# Patient Record
Sex: Female | Born: 1935
Health system: Southern US, Community
[De-identification: ages and names within clinical notes are randomized; demographics above are authoritative.]

## PROBLEM LIST (undated history)

## (undated) DIAGNOSIS — D7589 Other specified diseases of blood and blood-forming organs: Secondary | ICD-10-CM

## (undated) DIAGNOSIS — R609 Edema, unspecified: Secondary | ICD-10-CM

## (undated) DIAGNOSIS — I4729 Other ventricular tachycardia: Secondary | ICD-10-CM

## (undated) DIAGNOSIS — M810 Age-related osteoporosis without current pathological fracture: Secondary | ICD-10-CM

## (undated) DIAGNOSIS — I472 Ventricular tachycardia: Secondary | ICD-10-CM

## (undated) DIAGNOSIS — E079 Disorder of thyroid, unspecified: Secondary | ICD-10-CM

## (undated) DIAGNOSIS — I471 Supraventricular tachycardia: Secondary | ICD-10-CM

## (undated) DIAGNOSIS — I4719 Other supraventricular tachycardia: Secondary | ICD-10-CM

## (undated) DIAGNOSIS — E119 Type 2 diabetes mellitus without complications: Secondary | ICD-10-CM

## (undated) DIAGNOSIS — R768 Other specified abnormal immunological findings in serum: Principal | ICD-10-CM

## (undated) DIAGNOSIS — D539 Nutritional anemia, unspecified: Secondary | ICD-10-CM

## (undated) HISTORY — DX: Other ventricular tachycardia: I47.29

## (undated) HISTORY — DX: Ventricular tachycardia: I47.2

## (undated) HISTORY — DX: Other specified abnormal immunological findings in serum: R76.8

## (undated) HISTORY — DX: Supraventricular tachycardia: I47.1

## (undated) HISTORY — DX: Other specified diseases of blood and blood-forming organs: D75.89

## (undated) HISTORY — DX: Type 2 diabetes mellitus without complications: E11.9

## (undated) HISTORY — DX: Disorder of thyroid, unspecified: E07.9

## (undated) HISTORY — PX: APPENDECTOMY: SHX54

## (undated) HISTORY — DX: Edema, unspecified: R60.9

## (undated) HISTORY — DX: Nutritional anemia, unspecified: D53.9

## (undated) HISTORY — DX: Age-related osteoporosis without current pathological fracture: M81.0

## (undated) HISTORY — DX: Other supraventricular tachycardia: I47.19

---

## 2010-03-03 HISTORY — PX: FRACTURE SURGERY: SHX138

## 2010-11-15 ENCOUNTER — Inpatient Hospital Stay (HOSPITAL_COMMUNITY)
Admission: EM | Admit: 2010-11-15 | Discharge: 2010-11-20 | DRG: 481 | Disposition: A | Discharge status: Skilled Nursing Facility | Payer: Medicare HMO | Attending: Orthopedic Surgery | Admitting: Orthopedic Surgery

## 2010-11-15 ENCOUNTER — Emergency Department (HOSPITAL_COMMUNITY): Payer: Medicare HMO

## 2010-11-15 DIAGNOSIS — M810 Age-related osteoporosis without current pathological fracture: Secondary | ICD-10-CM | POA: Diagnosis present

## 2010-11-15 DIAGNOSIS — Z79899 Other long term (current) drug therapy: Secondary | ICD-10-CM

## 2010-11-15 DIAGNOSIS — D62 Acute posthemorrhagic anemia: Secondary | ICD-10-CM | POA: Diagnosis not present

## 2010-11-15 DIAGNOSIS — N289 Disorder of kidney and ureter, unspecified: Secondary | ICD-10-CM | POA: Diagnosis not present

## 2010-11-15 DIAGNOSIS — E039 Hypothyroidism, unspecified: Secondary | ICD-10-CM | POA: Diagnosis present

## 2010-11-15 DIAGNOSIS — Y92009 Unspecified place in unspecified non-institutional (private) residence as the place of occurrence of the external cause: Secondary | ICD-10-CM

## 2010-11-15 DIAGNOSIS — Z01812 Encounter for preprocedural laboratory examination: Secondary | ICD-10-CM

## 2010-11-15 DIAGNOSIS — Y939 Activity, unspecified: Secondary | ICD-10-CM

## 2010-11-15 DIAGNOSIS — S72309A Unspecified fracture of shaft of unspecified femur, initial encounter for closed fracture: Principal | ICD-10-CM | POA: Diagnosis present

## 2010-11-15 DIAGNOSIS — Y998 Other external cause status: Secondary | ICD-10-CM

## 2010-11-15 DIAGNOSIS — Z794 Long term (current) use of insulin: Secondary | ICD-10-CM

## 2010-11-15 DIAGNOSIS — T148XXA Other injury of unspecified body region, initial encounter: Secondary | ICD-10-CM

## 2010-11-15 DIAGNOSIS — E119 Type 2 diabetes mellitus without complications: Secondary | ICD-10-CM | POA: Diagnosis present

## 2010-11-15 DIAGNOSIS — E871 Hypo-osmolality and hyponatremia: Secondary | ICD-10-CM | POA: Diagnosis not present

## 2010-11-15 DIAGNOSIS — W19XXXA Unspecified fall, initial encounter: Secondary | ICD-10-CM | POA: Diagnosis present

## 2010-11-15 LAB — DIFFERENTIAL
Basophils Absolute: 0 10*3/uL (ref 0.0–0.1)
Basophils Relative: 0 % (ref 0–1)
Eosinophils Absolute: 0 10*3/uL (ref 0.0–0.7)
Eosinophils Relative: 0 % (ref 0–5)
Lymphocytes Relative: 13 % (ref 12–46)
Lymphs Abs: 1.2 10*3/uL (ref 0.7–4.0)
Monocytes Absolute: 0.5 10*3/uL (ref 0.1–1.0)
Monocytes Relative: 5 % (ref 3–12)
Neutro Abs: 7.8 10*3/uL — ABNORMAL HIGH (ref 1.7–7.7)
Neutrophils Relative %: 82 % — ABNORMAL HIGH (ref 43–77)

## 2010-11-15 LAB — CBC
HCT: 37.5 % (ref 36.0–46.0)
Hemoglobin: 12.8 g/dL (ref 12.0–15.0)
MCH: 32.9 pg (ref 26.0–34.0)
MCHC: 34.1 g/dL (ref 30.0–36.0)
MCV: 96.4 fL (ref 78.0–100.0)
Platelets: 221 10*3/uL (ref 150–400)
RBC: 3.89 MIL/uL (ref 3.87–5.11)
RDW: 13.4 % (ref 11.5–15.5)
WBC: 9.5 10*3/uL (ref 4.0–10.5)

## 2010-11-16 LAB — GLUCOSE, CAPILLARY
Glucose-Capillary: 195 mg/dL — ABNORMAL HIGH (ref 70–99)
Glucose-Capillary: 331 mg/dL — ABNORMAL HIGH (ref 70–99)
Glucose-Capillary: 216 mg/dL — ABNORMAL HIGH (ref 70–99)

## 2010-11-16 LAB — URINALYSIS, ROUTINE W REFLEX MICROSCOPIC
Leukocytes, UA: NEGATIVE
Protein, ur: NEGATIVE mg/dL
Urobilinogen, UA: 0.2 mg/dL (ref 0.0–1.0)

## 2010-11-16 LAB — BASIC METABOLIC PANEL
BUN: 35 mg/dL — ABNORMAL HIGH (ref 6–23)
CO2: 24 mEq/L (ref 19–32)
Calcium: 9.1 mg/dL (ref 8.4–10.5)
Chloride: 101 mEq/L (ref 96–112)
Creatinine, Ser: 1.07 mg/dL (ref 0.50–1.10)
GFR calc Af Amer: >60 mL/min (ref >60)
GFR calc non Af Amer: 50 mL/min — ABNORMAL LOW (ref >60)
Glucose, Bld: 258 mg/dL — ABNORMAL HIGH (ref 70–99)
Potassium: 4.5 mEq/L (ref 3.5–5.1)
Sodium: 135 mEq/L (ref 135–145)

## 2010-11-16 LAB — URINE MICROSCOPIC-ADD ON

## 2010-11-17 ENCOUNTER — Inpatient Hospital Stay (HOSPITAL_COMMUNITY): Payer: Medicare HMO

## 2010-11-17 LAB — CBC
HCT: 33 % — ABNORMAL LOW (ref 36.0–46.0)
MCV: 97.6 fL (ref 78.0–100.0)
Platelets: 182 10*3/uL (ref 150–400)
RBC: 3.38 MIL/uL — ABNORMAL LOW (ref 3.87–5.11)
WBC: 5.6 10*3/uL (ref 4.0–10.5)

## 2010-11-17 LAB — APTT: aPTT: 30 seconds (ref 24–37)

## 2010-11-17 LAB — GLUCOSE, CAPILLARY
Glucose-Capillary: 166 mg/dL — ABNORMAL HIGH (ref 70–99)
Glucose-Capillary: 290 mg/dL — ABNORMAL HIGH (ref 70–99)

## 2010-11-17 LAB — HEMOGLOBIN AND HEMATOCRIT, BLOOD: HCT: 23.8 % — ABNORMAL LOW (ref 36.0–46.0)

## 2010-11-17 LAB — PROTIME-INR: INR: 1.09 (ref 0.00–1.49)

## 2010-11-18 LAB — BASIC METABOLIC PANEL
CO2: 18 mEq/L — ABNORMAL LOW (ref 19–32)
Calcium: 7.9 mg/dL — ABNORMAL LOW (ref 8.4–10.5)
Glucose, Bld: 352 mg/dL — ABNORMAL HIGH (ref 70–99)
Sodium: 133 mEq/L — ABNORMAL LOW (ref 135–145)

## 2010-11-18 LAB — GLUCOSE, CAPILLARY: Glucose-Capillary: 324 mg/dL — ABNORMAL HIGH (ref 70–99)

## 2010-11-18 LAB — CBC
HCT: 25.8 % — ABNORMAL LOW (ref 36.0–46.0)
MCH: 33 pg (ref 26.0–34.0)
MCHC: 33.3 g/dL (ref 30.0–36.0)
MCV: 98.9 fL (ref 78.0–100.0)
RDW: 13.3 % (ref 11.5–15.5)

## 2010-11-19 LAB — GLUCOSE, CAPILLARY
Glucose-Capillary: 259 mg/dL — ABNORMAL HIGH (ref 70–99)
Glucose-Capillary: 295 mg/dL — ABNORMAL HIGH (ref 70–99)
Glucose-Capillary: 349 mg/dL — ABNORMAL HIGH (ref 70–99)
Glucose-Capillary: 380 mg/dL — ABNORMAL HIGH (ref 70–99)

## 2010-11-19 LAB — PROTIME-INR: INR: 1.74 — ABNORMAL HIGH (ref 0.00–1.49)

## 2010-11-19 LAB — BASIC METABOLIC PANEL
BUN: 37 mg/dL — ABNORMAL HIGH (ref 6–23)
Chloride: 99 mEq/L (ref 96–112)
GFR calc Af Amer: 47 mL/min — ABNORMAL LOW (ref >60)
Potassium: 4.2 mEq/L (ref 3.5–5.1)

## 2010-11-19 LAB — CBC
HCT: 22.3 % — ABNORMAL LOW (ref 36.0–46.0)
Platelets: 158 10*3/uL (ref 150–400)
RDW: 13.7 % (ref 11.5–15.5)
WBC: 6.7 10*3/uL (ref 4.0–10.5)

## 2010-11-20 LAB — TYPE AND SCREEN: Antibody Screen: NEGATIVE

## 2010-11-20 LAB — BASIC METABOLIC PANEL
BUN: 33 mg/dL — ABNORMAL HIGH (ref 6–23)
Creatinine, Ser: 1.03 mg/dL (ref 0.50–1.10)
GFR calc Af Amer: >60 mL/min (ref >60)
GFR calc non Af Amer: 53 mL/min — ABNORMAL LOW (ref >60)

## 2010-11-20 LAB — CBC
MCH: 31.5 pg (ref 26.0–34.0)
MCHC: 34.2 g/dL (ref 30.0–36.0)
Platelets: 157 10*3/uL (ref 150–400)
RDW: 16.2 % — ABNORMAL HIGH (ref 11.5–15.5)

## 2010-11-20 LAB — PROTIME-INR: Prothrombin Time: 20.1 seconds — ABNORMAL HIGH (ref 11.6–15.2)

## 2010-11-20 NOTE — Op Note (Signed)
[name] Church, [name] Church NO.:  [id number]  MEDICAL RECORD NO.:  [id number]  LOCATION:  1621                         FACILITY:  [facility]  PHYSICIAN:  Ollen Gross, M.D.    DATE OF BIRTH:  1934/04/14  DATE OF PROCEDURE:  11/17/2010 DATE OF DISCHARGE:                              OPERATIVE REPORT   PREOPERATIVE DIAGNOSIS:  Left distal femur fracture.  POSTOPERATIVE DIAGNOSIS:  Left distal femur fracture.  PROCEDURE:  Open reduction and internal fixation, left distal femur fracture.  SURGEON:  Ollen Gross, MD  ASSISTANT:  Alexzandrew L. Julien Girt, PA-C  ANESTHESIA:  General.  ESTIMATED BLOOD LOSS:  200.  DRAIN:  None.  No tourniquet used.  COMPLICATIONS:  None.  CONDITION:  Stable to Recovery.  BRIEF CLINICAL NOTE:  [name] Church is a 76 year old female who had a fall on flexing knee 2 nights ago, sustaining a grossly comminuted distal femur fracture.  She presents now for open reduction and internal fixation.  PROCEDURE IN DETAIL:  After successful administration of general anesthetic, the left lower extremity was isolated from her perineum with plastic drapes, and prepped and draped in a usual sterile fashion.  Due to girth and short length of her thigh, I could not use a tourniquet. Lateral incision was made over the distal femur, skin cut with a 10 blade through subcutaneous tissue to the lateral condyle and femur.  The fracture was grossly comminuted and the bone was quite soft and fragile. I was able to apply traction and disimpact the fracture fragments.  It was felt that we could attempt a percutaneous plating up on the shaft of the femur.  The plan is to use the Biomet distal femoral locking plate poly axial.  The template for the plate was placed along the femur and under fluoroscopic guidance, it was decided a size 12 full plate would be necessary.  A 12-hole plate was opened.  We placed a plate along the femoral condyle and up along the shaft of  the femur.  We placed the guide pin for the central locking screw distally and measured to be 60 mm.  We then applied traction and placed a locking pin into the plate so it would be on a lateral cortex of the femur.  There was some comminution line up the shaft of the femur, but the plate was well beyond that.  I then placed the distal central locking screw into the distal femur and the condyle.  This translated the fragment slightly medially when I put the plate down onto the lateral cortex of the femur. We were unable to extract that screw and thus it was left intact and accepted some of the medial translation as it gave [place] the best bony contact at the fracture site.  I placed 2 screws into the femoral shaft through the plate through the external guide.  Under lateral fluoro, it was noted that the screws were in 1 cortex, but the plate had shifted slightly anteriorly.  I felt that due to the gross comminution, I needed to do this by open technique and made an incision all the way along the lateral course of the thigh for the length of  the plate.  I was then able to reduce the shaft fragments and hold them in place with fracture reduction clamps.  I then clamped the plate to the lateral cortex of the femur and filled approximately 8 holes of the plate with cortical screws.  This led to very stable construct bridging across the fracture. The fracture overall alignment has the distal fragment and a tiny bit of extension which we had to do in order to gain good bony contact.  The distal fragment also translated slightly medially which also had to be done to get good bony contact.  There otherwise was going to be too much of a gap in between the fragments.  We placed 3 more locking screws into the distal fragment.  I placed the knee through a range of motion and she hyperextends about 5 degrees and was able to flex down about 130 and construct was stable throughout the range of motion.  We  then thoroughly irrigated with about a liter of saline.  The fascia lata and IT band were then closed with #1 Vicryl, subcutaneous closed with #1-0 and #2-0 Vicryl, and skin closed with staples.  Incision was cleaned and dried and bulky sterile dressing applied.  She was placed into a knee immobilizer, awakened, and transferred to Recovery in stable condition.  Please note that a surgical assistant was a medical necessity for this operation to apply traction to successfully reduce and align the fracture and maintain hat alignment while I was placing the hardware and to provide appropriate retraction to protect  the neurovascular structures.   Ollen Gross, M.D.     FA/MEDQ  D:  11/17/2010  T:  11/17/2010  Job:  [id number]  Electronically Signed by Ollen Gross M.D. on 11/20/2010 10:12:34 AM

## 2010-11-27 NOTE — Discharge Summary (Signed)
[name] Church, TERRERO NO.:  [id number]  MEDICAL RECORD NO.:  [id number]  LOCATION:  1621                         FACILITY:  [facility]  PHYSICIAN:  Ollen Gross, M.D.    DATE OF BIRTH:  [birth year]  DATE OF ADMISSION:  11/15/2010 DATE OF DISCHARGE:  11/20/2010                        DISCHARGE SUMMARY - REFERRING   ADDENDUM:  HOSPITAL COURSE:  The patient is a 76 year old female.  She was admitted to [facility] in the early morning hours of November 15, 2010.  She was placed at bedrest and admitted by Dr. Lequita Halt.  Due to the nature of the fracture, she was placed at bedrest and x-rays were reviewed.  We had made sure with the reps at the special [employer] was brought in to the hospital.  We allowed her to eat on Saturday on the 15th and pre-oped her with further labs that need to be evaluated such as coag studies.  Once she was pre-oped and evaluated, she was placed at bedrest.  We scheduled the time the following morning on September 16 on Sunday.  She was taken to the operating room and underwent the above-stated procedure, very complex femur fracture that took several hours.  She was later transferred to recovery room and then back up to the orthopedic floor.  She was given p.o. and IV analgesics for pain control.  Despite the nature of the injury and the surgery, she is actually doing pretty well.  On the morning of day 1, we knew she needed to look into a skilled facility, so got social work involved. Hemoglobin was down to 8.6.  She was very slow to progress with physical therapy, so was felt she would definitely need rehab.  She was very slow to progress and was only doing pivot stand, bed to chair transfers initially for the first day or so.  By day 2, her hemoglobin was down a little bit lower down to 7.5.  Due to the nature of the blood loss in the surgery, it was felt she would benefit from undergoing transfusion. She did receive 2  units of blood.  Her urine output was also down with a hemoglobin.  She received blood and also fluids. Her hemoglobin responded very well and by the following day, it was back up to 9.  She was doing a little bit better once she got her blood.  She was still requiring total assist in transfers.  Therefore, we continued to look for the bed.  On postop day 3 on November 20, 2010, she was seen in rounds.  The hemoglobin was back up to 9. Her sodium, which had dropped initially was already back up and improving.  She was feeling much better and clinically looked improved.  It was noted that bed became available over [facility] and it was decided the patient be transferred over at that time.  DISCHARGE PLAN:  The patient will be transferred over to [facility] on November 20, 2010.  DISCHARGE DIAGNOSES:  Please see above.  DISCHARGE MEDICATIONS:  Current medications at the time of  transfer include: 1. Ferrous sulfate 325 mg p.o. daily. 2. Levothyroxine 137 mcg p.o. daily. 3. Metformin 500 mg p.o.  b.i.d. 4. Lisinopril 5 mg p.o. daily. 5. Coumadin protocol.  Please titrate the Coumadin level for target     INR between 2.0 and 3.0.  She needs to be on Coumadin for 3 weeks     from the date of surgery. 6. Colace 100 mg p.o. b.i.d. 7. She takes Humalog 2-5 units three times a day before meals and she     takes Lantus 32 units every morning (please note she was on a     sliding scale during the hospital course, but resuming her previous     medications). 8. Vicodin 5 mg one to two tablets every 4 hours as needed for     moderate to severe pain. 9. Robaxin 500 mg p.o. q.6 hours p.r.n. spasm. 10.Tylenol 325 one or two every 4-6 hours as needed for mild pain,     temperature, or headache. 11.Laxative of choice. 12.Enema of choice. 13.Lovenox 40 mg subcutaneous injection daily for 2 days and then     discontinue the Lovenox.  DIET:  Medium modified carb-diabetic diet.  ACTIVITY:  She is  only touchdown weightbearing.  Do not advance weightbearing status.  She is only touchdown weightbearing to the left lower extremity.  Knee immobilizer when she is up out of the bed.  She may remove the knee immobilizer while doing therapy.  While she is in the bed or in the chair, therapy may start some gentle passive range of motion of the knee to prevent stiffness.  She may only do active range of motion while she is in the bed with therapist with the knee immobilizer off.  She may start showering, however do not submerge incision under water.  PT and OT for gait training, ambulation, ADLs, range of motion and strengthening exercises.  Again, please note she is only touchdown weightbearing, do not advance weightbearing status.  FOLLOW-UP:  She needs to follow up next week in the office on Thursday, the 27th.  Please contact the office for an appointment on [phone] for appointment follow-up care and staple removal.  DISPOSITION:  She is going out to [facility].  CONDITION UPON DISCHARGE:  Improving.     Alexzandrew L. Julien Girt, P.A.C.   ______________________________ Ollen Gross, M.D.    ALP/MEDQ  D:  11/20/2010  T:  11/20/2010  Job:  [id number]  Electronically Signed by Patrica Duel P.A.C. on 11/21/2010 11:30:50 AM Electronically Signed by Ollen Gross M.D. on 11/27/2010 10:31:23 AM

## 2010-11-27 NOTE — Discharge Summary (Signed)
  NAMEHAVANNA, GRONER NO.:  [id number]  MEDICAL RECORD NO.:  [id number]  LOCATION:  1621                         FACILITY:  [facility]  PHYSICIAN:  Ollen Gross, M.D.    DATE OF BIRTH:  06/05/1934  DATE OF ADMISSION:  11/15/2010 DATE OF DISCHARGE:  11/20/2010                        DISCHARGE SUMMARY - REFERRING   ADMITTING DIAGNOSES: 1. Displaced comminuted left distal femur fracture. 2. Diabetes mellitus. 3. Hypothyroidism. 4. Osteoporosis.  DISCHARGE DIAGNOSES: 1. Left distal femur fracture, status post open reduction internal     fixation of left distal femur. 2. Postoperative acute blood loss anemia. 3. Status post transfusion without sequelae. 4. Postoperative hyponatremia, improved. 5. Mild renal insufficiency, improved. 6. Diabetes mellitus. 7. Hypothyroidism. 8. Osteoporosis.  PROCEDURE:  November 17, 2010, open reduction and internal fixation of the left distal femur fracture; surgeon Dr. Lequita Halt; assistant, Alexzandrew L. Perkins, P.A.C; anesthesia was general.  CONSULTS:  None.  BRIEF HISTORY:  The patient is a 76 year old female, who had a fall on a flexed knee on the date of admission on November 15, 2010.  She was seen in the emergency room, found to have a grossly comminuted distal femur fracture.  She was admitted for bedrest, pain control measures and surgical intervention.  LABORATORY DATA:  CBC on admission, hemoglobin 12.5, hematocrit 37.5, white cell count 9.5, platelets 221.  PT/INR preop 14.3 and 1.09 with a PTT of 30.  The BMET on admission showed a slightly elevated BUN at 35, but a normal creatinine of 1.07.  Electrolytes all within normal limits. Preop UA was positive for glucose and positive for ketones and large blood, 7-10 red cells, otherwise negative.  Blood group type A negative. Nasal swabs were positive for Staph aureus, but negative for MRSA. Serial CBCs were followed:  Hemoglobin dropped down to 8.4  postop, stabilized at 8.6 for a day, then continued to drop down to 7.5.  She was given blood.  Post transfusion hemoglobin back up to 9.0 and the H and H at the time of transfer was 9.0 and 26.3. Serial protimes followed per Coumadin protocol last showed PT/INR 20.1 and 1.68.  Serial BMETs followed for 3 days following the surgery, sodium did drop to 135, down to 131, was back up to 134.  The creatinine went from a normal level of 1.07, up to 1.33 and then back down to 1.03.  BUN went from 35 to 37, back down to 33.  X-rays, 2-view chest dated November 16, 2010, no acute abnormalities.  EKG dated November 15, 2010, sinus tachycardia, unconfirmed.  HOSPITAL COURSE: Dictation ended at this point.     Alexzandrew L. Julien Girt, P.A.C.   ______________________________ Ollen Gross, M.D.    ALP/MEDQ  D:  11/20/2010  T:  11/20/2010  Job:  [id number]  Electronically Signed by Patrica Duel P.A.C. on 11/21/2010 11:30:56 AM Electronically Signed by Ollen Gross M.D. on 11/27/2010 10:31:25 AM

## 2012-12-11 IMAGING — CR DG KNEE COMPLETE 4+V*L*
5 series · 5 of 5 positions shown · non-contrast
Comparison: None.

CLINICAL DATA: Status post fall; left knee swelling and pain.

LEFT KNEE - COMPLETE 4+ VIEW

[t knee ap left (1 of 2)]
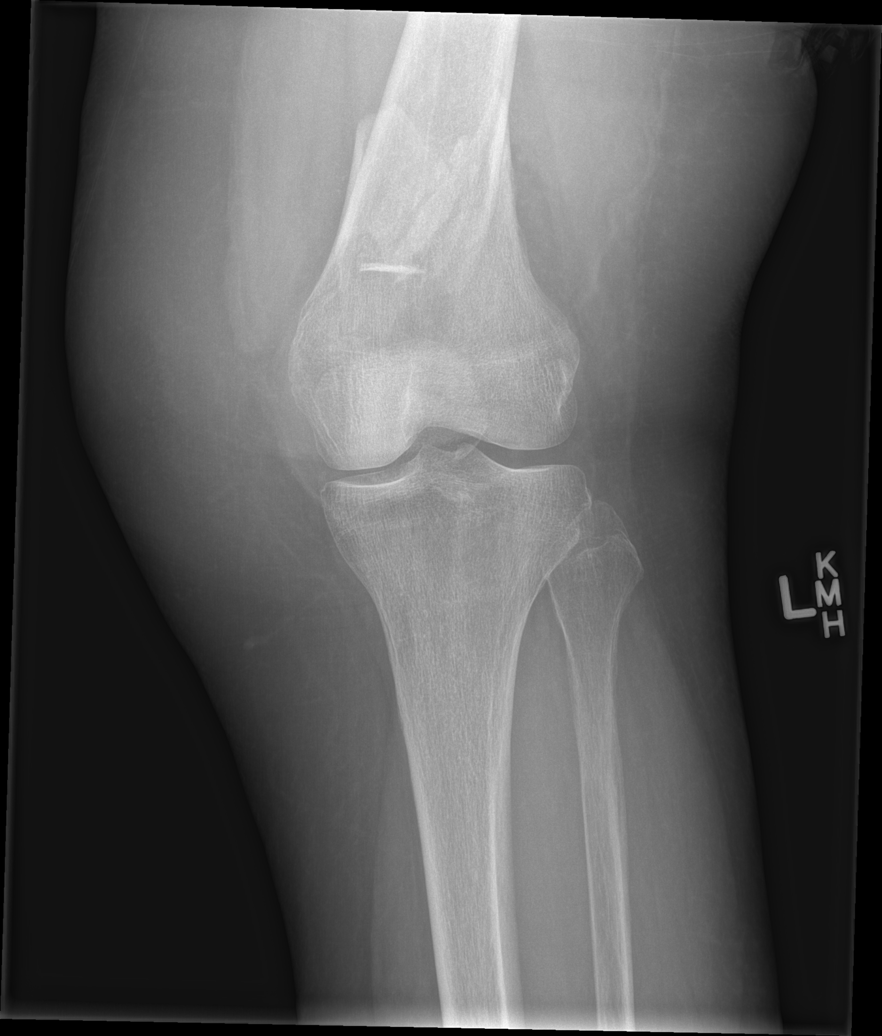

[t knee obl left (1 of 2)]
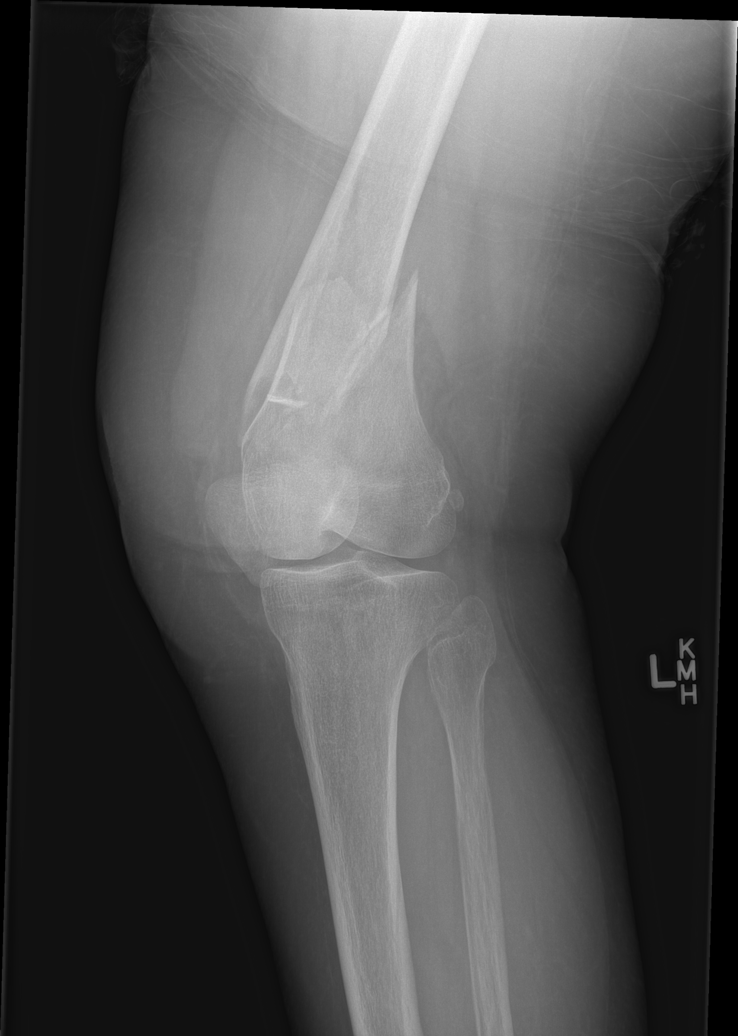

[t knee obl left (2 of 2)]
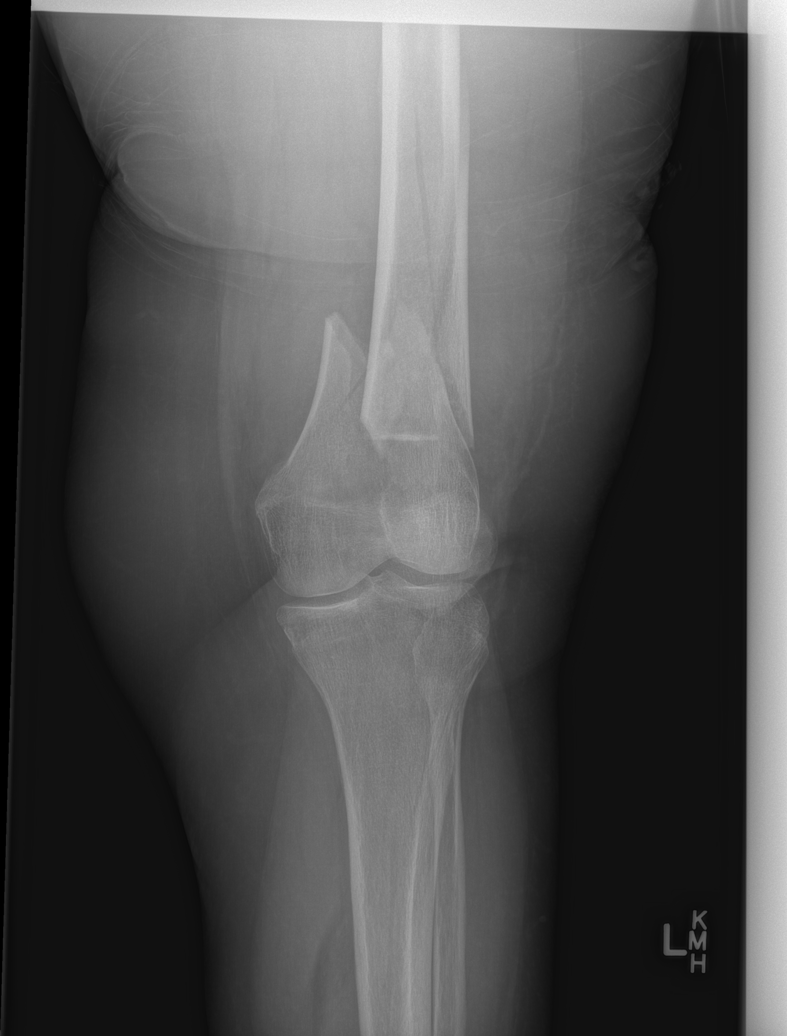

[t knee ap left (2 of 2)]
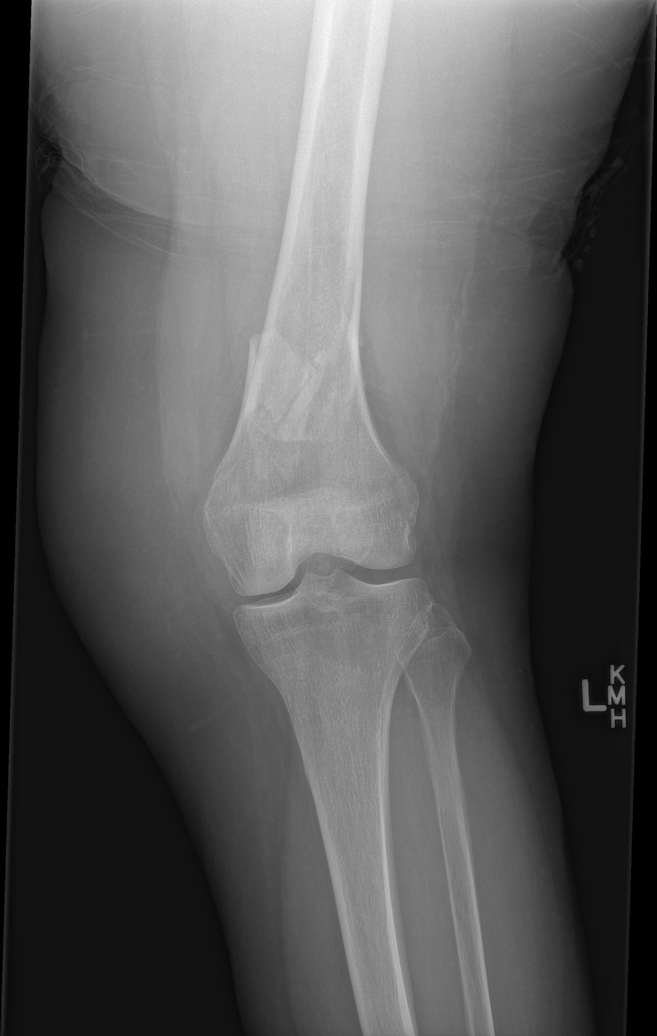

[x knee lat left]
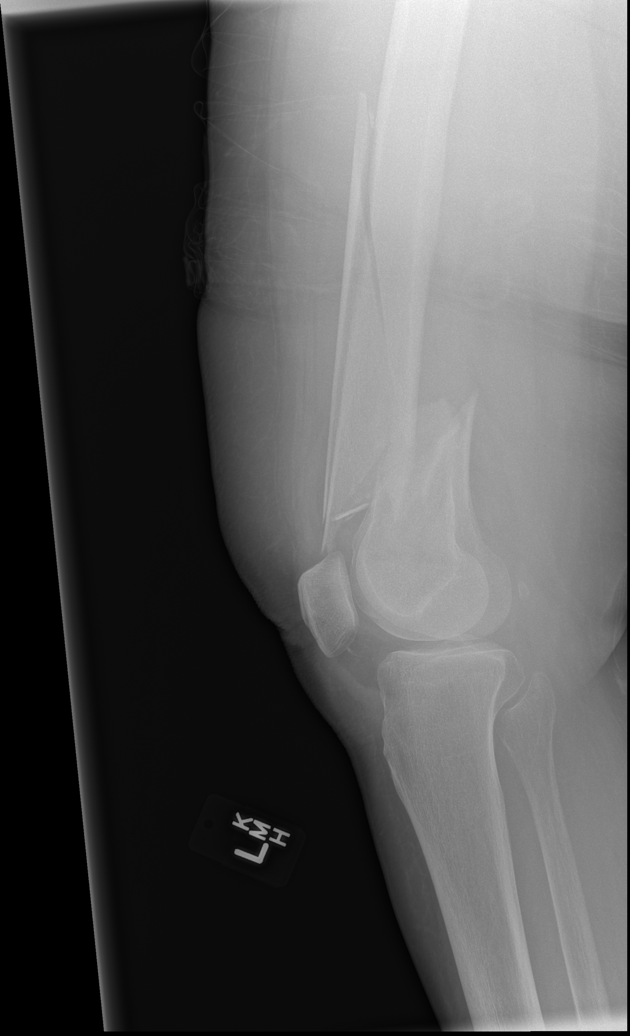

[5 of 5 positions shown; findings below may reference images not displayed]

FINDINGS: There is a comminuted fracture through the distal
metadiaphysis of the left femur, with significant posterior
displacement of the distal femur, and mild lateral angulation.
There is a prominent oblique fracture extending more proximally
along the femoral diaphysis.  This demonstrates mild displacement.

The superior pole of the patella abuts the proximal femoral
fragment, due to shortening at the fracture site.  No definite knee
joint effusion is characterized.  The visualized joint spaces are
grossly preserved.  There is no evidence of intra-articular
extension.

Due to the degree of posterior displacement, underlying vascular
injury cannot be entirely excluded, though not particularly likely
given this type of fracture; suggest clinical correlation.  A
fabella is noted.
IMPRESSION: 1.  Comminuted fracture through the distal metadiaphysis of the
left femur, with significant posterior displacement of the distal
femur, and mild lateral angulation; prominent oblique fracture
extending more proximally along the femoral diaphysis, with mild
displacement.
2.  The superior pole of the patella abuts the proximal femoral
fragment, due to shortening at the fracture site.  The knee joint
appears grossly preserved.
3.  Due to the degree of posterior displacement, underlying
vascular injury cannot be entirely excluded, but is not
particularly likely given the type of fracture.  Suggest clinical
correlation.

## 2013-03-03 HISTORY — PX: EYE SURGERY: SHX253

## 2013-03-09 ENCOUNTER — Ambulatory Visit: Payer: Self-pay | Admitting: Ophthalmology

## 2013-06-09 ENCOUNTER — Ambulatory Visit: Payer: Self-pay | Admitting: Podiatrist

## 2013-06-10 ENCOUNTER — Ambulatory Visit: Payer: Self-pay | Admitting: Podiatrist

## 2013-06-16 ENCOUNTER — Ambulatory Visit (INDEPENDENT_AMBULATORY_CARE_PROVIDER_SITE_OTHER): Payer: Commercial Managed Care - HMO | Admitting: Podiatrist

## 2013-06-16 ENCOUNTER — Encounter: Payer: Self-pay | Admitting: Podiatrist

## 2013-06-16 VITALS — BP 146/62 | HR 78 | Resp 18 | Ht 63.5 in | Wt 195.0 lb

## 2013-06-16 DIAGNOSIS — M79609 Pain in unspecified limb: Secondary | ICD-10-CM

## 2013-06-16 DIAGNOSIS — B351 Tinea unguium: Secondary | ICD-10-CM

## 2013-06-16 DIAGNOSIS — E1149 Type 2 diabetes mellitus with other diabetic neurological complication: Secondary | ICD-10-CM

## 2013-06-16 DIAGNOSIS — M2142 Flat foot [pes planus] (acquired), left foot: Secondary | ICD-10-CM

## 2013-06-16 DIAGNOSIS — E1142 Type 2 diabetes mellitus with diabetic polyneuropathy: Secondary | ICD-10-CM

## 2013-06-16 DIAGNOSIS — M2140 Flat foot [pes planus] (acquired), unspecified foot: Secondary | ICD-10-CM

## 2013-06-16 DIAGNOSIS — E1140 Type 2 diabetes mellitus with diabetic neuropathy, unspecified: Secondary | ICD-10-CM

## 2013-06-16 DIAGNOSIS — R609 Edema, unspecified: Secondary | ICD-10-CM | POA: Insufficient documentation

## 2013-06-16 MED ORDER — EFINACONAZOLE 10 % EX SOLN: 1.0000 [drp] | Freq: Every day | CUTANEOUS | Status: DC

## 2013-06-16 NOTE — Progress Notes (Signed)
   Subjective:    Patient ID: [name] Church, female    DOB: 1934/03/31, 79 y.o.   MRN: [id number]  HPI Comments: Pt presents for debridement B/L 1 - 5 toenails and diabetic evaluation of feet.     Review of Systems  HENT: Positive for tinnitus.   Cardiovascular: Positive for leg swelling.  All other systems reviewed and are negative.      Objective:   Physical Exam GENERAL APPEARANCE: Alert, conversant. Appropriately groomed. No acute distress.  VASCULAR: Pedal pulses palpable at 1/4 DP and PT bilateral.  Capillary refill time is immediate to all digits,  Proximal to distal cooling it warm to warm.  Digital hair growth is present bilateral  NEUROLOGIC: sensation is intact epicritically and protectively to 5.07 monofilament at 3/5 sites bilateral.  Light touch is intact bilateral, vibratory sensation intact bilateral, achilles tendon reflex is intact bilateral.  MUSCULOSKELETAL: right foot is rectus.  Left foot has a flat foot appearance and posterior tibial tendonitis in the past that Dr. Sharol Given has treated with a boot immobilization.   DERMATOLOGIC: toenails are thick and discolored esp bilateral first .  All nails are elongated and symptomatic bilateral. No open lesions present.  No interdigital maceration seen      Assessment & Plan:  Diabetes, symptomatic onychomycosis Plan:  Debrided the toenails without complication.  Recommended Jublia and rx was written through [facility].  i will see her back for routine care.

## 2013-06-16 NOTE — Patient Instructions (Signed)
Diabetes and Foot Care Diabetes may cause you to have problems because of poor blood supply (circulation) to your feet and legs. This may cause the skin on your feet to become thinner, break easier, and heal more slowly. Your skin may become dry, and the skin may peel and crack. You may also have nerve damage in your legs and feet causing decreased feeling in them. You may not notice minor injuries to your feet that could lead to infections or more serious problems. Taking care of your feet is one of the most important things you can do for yourself.  HOME CARE INSTRUCTIONS  Wear shoes at all times, even in the house. Do not go barefoot. Bare feet are easily injured.  Check your feet daily for blisters, cuts, and redness. If you cannot see the bottom of your feet, use a mirror or ask someone for help.  Wash your feet with warm water (do not use hot water) and mild soap. Then pat your feet and the areas between your toes until they are completely dry. Do not soak your feet as this can dry your skin.  Apply a moisturizing lotion or petroleum jelly (that does not contain alcohol and is unscented) to the skin on your feet and to dry, brittle toenails. Do not apply lotion between your toes.  Trim your toenails straight across. Do not dig under them or around the cuticle. File the edges of your nails with an emery board or nail file.  Do not cut corns or calluses or try to remove them with medicine.  Wear clean socks or stockings every day. Make sure they are not too tight. Do not wear knee-high stockings since they may decrease blood flow to your legs.  Wear shoes that fit properly and have enough cushioning. To break in new shoes, wear them for just a few hours a day. This prevents you from injuring your feet. Always look in your shoes before you put them on to be sure there are no objects inside.  Do not cross your legs. This may decrease the blood flow to your feet.  If you find a minor scrape,  cut, or break in the skin on your feet, keep it and the skin around it clean and dry. These areas may be cleansed with mild soap and water. Do not cleanse the area with peroxide, alcohol, or iodine.  When you remove an adhesive bandage, be sure not to damage the skin around it.  If you have a wound, look at it several times a day to make sure it is healing.  Do not use heating pads or hot water bottles. They may burn your skin. If you have lost feeling in your feet or legs, you may not know it is happening until it is too late.  Make sure your health care provider performs a complete foot exam at least annually or more often if you have foot problems. Report any cuts, sores, or bruises to your health care provider immediately. SEEK MEDICAL CARE IF:   You have an injury that is not healing.  You have cuts or breaks in the skin.  You have an ingrown nail.  You notice redness on your legs or feet.  You feel burning or tingling in your legs or feet.  You have pain or cramps in your legs and feet.  Your legs or feet are numb.  Your feet always feel cold. SEEK IMMEDIATE MEDICAL CARE IF:   There is increasing redness,   swelling, or pain in or around a wound.  There is a red line that goes up your leg.  Pus is coming from a wound.  You develop a fever or as directed by your health care provider.  You notice a bad smell coming from an ulcer or wound. Document Released: 02/15/2000 Document Revised: 10/20/2012 Document Reviewed: 07/27/2012 Exit[employer] Patient Information 2014 [employer].  

## 2013-10-13 ENCOUNTER — Ambulatory Visit (INDEPENDENT_AMBULATORY_CARE_PROVIDER_SITE_OTHER): Payer: Commercial Managed Care - HMO | Admitting: Podiatrist

## 2013-10-13 DIAGNOSIS — E1140 Type 2 diabetes mellitus with diabetic neuropathy, unspecified: Secondary | ICD-10-CM

## 2013-10-13 DIAGNOSIS — E1142 Type 2 diabetes mellitus with diabetic polyneuropathy: Secondary | ICD-10-CM

## 2013-10-13 DIAGNOSIS — M79673 Pain in unspecified foot: Secondary | ICD-10-CM

## 2013-10-13 DIAGNOSIS — M79609 Pain in unspecified limb: Secondary | ICD-10-CM

## 2013-10-13 DIAGNOSIS — B351 Tinea unguium: Secondary | ICD-10-CM

## 2013-10-13 DIAGNOSIS — E1149 Type 2 diabetes mellitus with other diabetic neurological complication: Secondary | ICD-10-CM

## 2013-10-13 NOTE — Patient Instructions (Signed)
[name] Church is the over the counter topical nail solution to try for your left great toenail.

## 2013-10-13 NOTE — Progress Notes (Signed)
   Subjective: Patient presents today for followup of diabetic foot and nail care. She relates she was going to use the [place] however it was going to be $70 to she prefers to use something over-the-counter instead.  Physical Exam  GENERAL APPEARANCE: Alert, conversant. Appropriately groomed. No acute distress.  VASCULAR: Pedal pulses palpable at 1/4 DP and PT bilateral. Capillary refill time is immediate to all digits, Proximal to distal cooling it warm to warm. Digital hair growth is present bilateral  NEUROLOGIC: sensation is intact epicritically and protectively to 5.07 monofilament at 3/5 sites bilateral. Light touch is intact bilateral, vibratory sensation intact bilateral, achilles tendon reflex is intact bilateral.  MUSCULOSKELETAL: right foot is rectus. Left foot has a flat foot appearance and posterior tibial tendonitis in the past that Dr. Sharol Given has treated with a boot immobilization.  DERMATOLOGIC: toenails are thick and discolored esp bilateral first . All nails are elongated and symptomatic bilateral. No open lesions present. No interdigital maceration seen   Assessment: Diabetes with neuropathy, symptomatic mycotic toenails  Plan: Debridement of toenails was carried out today without complication. Discussed the use of over-the-counter topicals including kerasal she will be seen back in 3 months or as needed for followup

## 2013-11-14 ENCOUNTER — Telehealth: Payer: Self-pay | Admitting: Hematology and Oncology

## 2013-11-14 NOTE — Telephone Encounter (Signed)
LEFT MESSAGE FOR PATIENT AND GAVE NP APPT FOR 09/29 @ 9:45 W/DR. [place]. LEFT CONTACT INFORMATION FOR PATIENT TO RETURN CALL TO CONFIRM MESSAGE WAS RECEIVED AND APPT D/T.

## 2013-11-17 ENCOUNTER — Telehealth: Payer: Self-pay | Admitting: Hematology and Oncology

## 2013-11-17 NOTE — Telephone Encounter (Signed)
PATIENT CALLED TO CONFIRM NP APPT

## 2013-11-29 ENCOUNTER — Encounter: Payer: Self-pay | Admitting: Hematology and Oncology

## 2013-11-29 ENCOUNTER — Ambulatory Visit (HOSPITAL_BASED_OUTPATIENT_CLINIC_OR_DEPARTMENT_OTHER): Payer: Medicare Other

## 2013-11-29 ENCOUNTER — Telehealth: Payer: Self-pay | Admitting: Hematology and Oncology

## 2013-11-29 ENCOUNTER — Ambulatory Visit (HOSPITAL_BASED_OUTPATIENT_CLINIC_OR_DEPARTMENT_OTHER): Payer: Commercial Managed Care - HMO | Admitting: Hematology and Oncology

## 2013-11-29 VITALS — BP 136/55 | HR 82 | Temp 98.3°F | Resp 19 | Ht 63.0 in | Wt 197.9 lb

## 2013-11-29 DIAGNOSIS — R894 Abnormal immunological findings in specimens from other organs, systems and tissues: Secondary | ICD-10-CM | POA: Diagnosis not present

## 2013-11-29 DIAGNOSIS — D539 Nutritional anemia, unspecified: Secondary | ICD-10-CM | POA: Diagnosis not present

## 2013-11-29 DIAGNOSIS — R768 Other specified abnormal immunological findings in serum: Secondary | ICD-10-CM

## 2013-11-29 DIAGNOSIS — D7589 Other specified diseases of blood and blood-forming organs: Secondary | ICD-10-CM

## 2013-11-29 HISTORY — DX: Nutritional anemia, unspecified: D53.9

## 2013-11-29 HISTORY — DX: Other specified diseases of blood and blood-forming organs: D75.89

## 2013-11-29 HISTORY — DX: Other specified abnormal immunological findings in serum: R76.8

## 2013-11-29 NOTE — Progress Notes (Signed)
Checked in new patient with no financial issues prior to seeing the dr,. She has Lenise's card and will call if any asst is needed. She [place] not been out of the country.

## 2013-11-29 NOTE — Progress Notes (Signed)
[place] NOTE  Patient Care Team: [name], MD as PCP - General (Family Medicine)  CHIEF COMPLAINTS/PURPOSE OF CONSULTATION:  Elevated MCV, history of bone fracture, elevated serum light chains  HISTORY OF PRESENTING ILLNESS:  [name] Church 79 y.o. female is here because of abnormal blood work detected on recent routine primary care visit.  She denies history of abnormal bone pain. She have chronic arthritis. 3 years ago, she fell and broke her left femur. In July 2014, she broke her toes on the left foot. Patient denies history of recurrent infection or atypical infections such as shingles of meningitis. Denies chills, night sweats, anorexia or abnormal weight loss.  MEDICAL HISTORY:  Past Medical History  Diagnosis Date  . Diabetes mellitus without complication   . Thyroid disease   . Edema   . Elevated serum immunoglobulin free light chain level 11/29/2013  . Macrocytosis without anemia 11/29/2013  . Unspecified deficiency anemia 11/29/2013    SURGICAL HISTORY: Past Surgical History  Procedure Laterality Date  . Eye surgery Left 03/2013    eye bleeds internally  . Eye surgery Right 03/2013  . Fracture surgery Left 2012    leg with metal fixation    SOCIAL HISTORY: History   Social History  . Marital Status: Married    Spouse Name: N/A    Number of Children: N/A  . Years of Education: N/A   Occupational History  . Not on file.   Social History Main Topics  . Smoking status: Never Smoker   . Smokeless tobacco: Not on file  . Alcohol Use: Not on file  . Drug Use: Not on file  . Sexual Activity: Not on file   Other Topics Concern  . Not on file   Social History Narrative  . No narrative on file    FAMILY HISTORY: No family history on file.  ALLERGIES:  is allergic to sulfa antibiotics.  MEDICATIONS:  Current Outpatient Prescriptions  Medication Sig Dispense Refill  . aspirin 325 MG EC tablet Take 325 mg by mouth  daily.      . Efinaconazole 10 % SOLN Apply 1 drop topically daily.  4 mL  11  . furosemide (LASIX) 20 MG tablet Take 20 mg by mouth.      . hydrochlorothiazide (MICROZIDE) 12.5 MG capsule Take 12.5 mg by mouth daily.      . insulin glargine (LANTUS) 100 UNIT/ML injection Inject 14 Units into the skin 2 (two) times daily.      . Insulin Lispro, Human, (HUMALOG [place]) Inject into the skin 2 (two) times daily.      . Iron 66 MG TABS Take by mouth.      . levothyroxine (SYNTHROID, LEVOTHROID) 137 MCG tablet Take 137 mcg by mouth daily before breakfast.      . lisinopril (PRINIVIL,ZESTRIL) 5 MG tablet Take 5 mg by mouth daily.      . Multiple Minerals-Vitamins (CALCIUM & VIT D3 BONE HEALTH PO) Take by mouth.      . Multiple Vitamins-Minerals (MULTIVITAMIN WITH MINERALS) tablet Take 1 tablet by mouth daily.      . Vitamin D, Cholecalciferol, 1000 UNITS TABS Take by mouth.       No current facility-administered medications for this visit.    REVIEW OF SYSTEMS:   Eyes: Denies blurriness of vision, double vision or watery eyes Ears, nose, mouth, throat, and face: Denies mucositis or sore throat Respiratory: Denies cough, dyspnea or wheezes Cardiovascular: Denies palpitation, chest  discomfort or lower extremity swelling Gastrointestinal:  Denies nausea, heartburn or change in bowel habits Skin: Denies abnormal skin rashes Lymphatics: Denies new lymphadenopathy or easy bruising Neurological:Denies numbness, tingling or new weaknesses Behavioral/Psych: Mood is stable, no new changes  All other systems were reviewed with the patient and are negative.  PHYSICAL EXAMINATION: ECOG PERFORMANCE STATUS: 1 - Symptomatic but completely ambulatory  Filed Vitals:   11/29/13 0957  BP: 136/55  Pulse: 82  Temp: 98.3 F (36.8 C)  Resp: 19   Filed Weights   11/29/13 0957  Weight: 197 lb 14.4 oz (89.767 kg)    GENERAL:alert, no distress and comfortable. She is morbidly obese SKIN: skin color, texture,  turgor are normal, no rashes or significant lesions EYES: normal, conjunctiva are pink and non-injected, sclera clear OROPHARYNX:no exudate, no erythema and lips, buccal mucosa, and tongue normal  NECK: supple, thyroid normal size, non-tender, without nodularity LYMPH:  no palpable lymphadenopathy in the cervical, axillary or inguinal LUNGS: clear to auscultation and percussion with normal breathing effort HEART: regular rate & rhythm and no murmurs with moderate bilateral lower extremity edema ABDOMEN:abdomen soft, non-tender and normal bowel sounds Musculoskeletal:no cyanosis of digits and no clubbing  PSYCH: alert & oriented x 3 with fluent speech NEURO: no focal motor/sensory deficits  LABORATORY DATA:  I have reviewed the data as listed Lab Results  Component Value Date   WBC 6.1 11/20/2010   HGB 9.0* 11/20/2010   HCT 26.3* 11/20/2010   MCV 92.0 11/20/2010   PLT 157 11/20/2010    ASSESSMENT & PLAN:  Elevated serum immunoglobulin free light chain level She does not has any evidence of organ damage to suggest this is related to multiple myeloma. I recommend ordering repeat blood tests, 24-hour urine collection and skeletal survey to be done in December and I will see her back to review test results.  Macrocytosis without anemia The elevated MCV could be due to the fatty liver congestion even though her liver function tests were within normal limits. I will order a serum vitamin B12 to exclude vitamin B 12 deficiency as a cause of this.    Orders Placed This Encounter  Procedures  . DG Bone Survey Met    Standing Status: Future     Number of Occurrences:      Standing Expiration Date: 01/29/2015    Order Specific Question:  Reason for Exam (SYMPTOM  OR DIAGNOSIS REQUIRED)    Answer:  staging myeloma    Order Specific Question:  Preferred imaging location?    Answer:  [facility]  . Comprehensive metabolic panel    Standing Status: Future     Number of Occurrences:       Standing Expiration Date: 01/29/2015  . CBC with Differential    Standing Status: Future     Number of Occurrences:      Standing Expiration Date: 01/29/2015  . Beta 2 microglobulin, serum    Standing Status: Future     Number of Occurrences:      Standing Expiration Date: 01/29/2015  . IFE, Urine (with Tot Prot)    Standing Status: Future     Number of Occurrences:      Standing Expiration Date: 01/29/2015  . SPEP & IFE with QIG    Standing Status: Future     Number of Occurrences:      Standing Expiration Date: 01/29/2015  . Protein Electro, 24-Hour Urine    Standing Status: Future     Number of Occurrences:  Standing Expiration Date: 01/29/2015  . Kappa/lambda light chains    Standing Status: Future     Number of Occurrences:      Standing Expiration Date: 01/29/2015  . Immunofixation interpretive, urine    Standing Status: Future     Number of Occurrences:      Standing Expiration Date: 01/29/2015  . Vitamin B12    Standing Status: Future     Number of Occurrences:      Standing Expiration Date: 01/03/2015  . Morphology    Standing Status: Future     Number of Occurrences:      Standing Expiration Date: 01/03/2015    All questions were answered. The patient knows to call the clinic with any problems, questions or concerns. I spent 40 minutes counseling the patient face to face. The total time spent in the appointment was 55 minutes and more than 50% was on counseling.     [place], [place], MD 11/29/2013 10:59 AM

## 2013-11-29 NOTE — Assessment & Plan Note (Signed)
The elevated MCV could be due to the fatty liver congestion even though her liver function tests were within normal limits. I will order a serum vitamin B12 to exclude vitamin B 12 deficiency as a cause of this.

## 2013-11-29 NOTE — Assessment & Plan Note (Signed)
She does not has any evidence of organ damage to suggest this is related to multiple myeloma. I recommend ordering repeat blood tests, 24-hour urine collection and skeletal survey to be done in December and I will see her back to review test results.

## 2013-11-29 NOTE — Telephone Encounter (Signed)
gv pt appt schedule for dec. pt sent back to lab to collect jug.

## 2014-01-12 ENCOUNTER — Ambulatory Visit (INDEPENDENT_AMBULATORY_CARE_PROVIDER_SITE_OTHER): Payer: Commercial Managed Care - HMO | Admitting: Podiatrist

## 2014-01-12 ENCOUNTER — Encounter: Payer: Self-pay | Admitting: Podiatrist

## 2014-01-12 DIAGNOSIS — M79676 Pain in unspecified toe(s): Secondary | ICD-10-CM

## 2014-01-12 DIAGNOSIS — B351 Tinea unguium: Secondary | ICD-10-CM

## 2014-01-12 DIAGNOSIS — M2142 Flat foot [pes planus] (acquired), left foot: Secondary | ICD-10-CM

## 2014-01-13 NOTE — Progress Notes (Signed)
   Subjective: Patient presents today for followup of diabetic foot and nail care. She has been treating the right great toenail with topical antifungal over-the-counter solution at the prescription strength ones were too expensive.  Physical Exam  GENERAL APPEARANCE: Alert, conversant. Appropriately groomed. No acute distress.  VASCULAR: Pedal pulses palpable at 1/4 DP and PT bilateral. Capillary refill time is immediate to all digits, Proximal to distal cooling it warm to warm. Digital hair growth is present bilateral  NEUROLOGIC: sensation is intact epicritically and protectively to 5.07 monofilament at 3/5 sites bilateral. Light touch is intact bilateral, vibratory sensation intact bilateral, achilles tendon reflex is intact bilateral.  MUSCULOSKELETAL: right foot is rectus. Left foot has a flat foot appearance and posterior tibial tendonitis in the past that Dr. Sharol Given has treated with a boot immobilization.  DERMATOLOGIC: toenails are thick and discolored esp bilateral first . All nails are elongated and symptomatic bilateral. No open lesions present. No interdigital maceration seen   Assessment: Diabetes with neuropathy, symptomatic mycotic toenails  Plan: Debridement of toenails was carried out today without complication. She will return in 3 months for continued follow up

## 2014-01-31 ENCOUNTER — Ambulatory Visit (HOSPITAL_COMMUNITY)
Admission: RE | Admit: 2014-01-31 | Discharge: 2014-01-31 | Disposition: A | Discharge status: Home / Self Care | Payer: Commercial Managed Care - HMO | Source: Ambulatory Visit | Attending: Hematology and Oncology | Admitting: Hematology and Oncology

## 2014-01-31 ENCOUNTER — Other Ambulatory Visit (HOSPITAL_BASED_OUTPATIENT_CLINIC_OR_DEPARTMENT_OTHER): Payer: Commercial Managed Care - HMO

## 2014-01-31 DIAGNOSIS — R768 Other specified abnormal immunological findings in serum: Secondary | ICD-10-CM | POA: Insufficient documentation

## 2014-01-31 DIAGNOSIS — D539 Nutritional anemia, unspecified: Secondary | ICD-10-CM

## 2014-01-31 DIAGNOSIS — D7589 Other specified diseases of blood and blood-forming organs: Secondary | ICD-10-CM

## 2014-01-31 LAB — CBC WITH DIFFERENTIAL/PLATELET
BASO%: 0.6 % (ref 0.0–2.0)
Basophils Absolute: 0 10*3/uL (ref 0.0–0.1)
EOS ABS: 0.1 10*3/uL (ref 0.0–0.5)
EOS%: 1.9 % (ref 0.0–7.0)
HEMATOCRIT: 43.7 % (ref 34.8–46.6)
HGB: 12.8 g/dL (ref 11.6–15.9)
LYMPH#: 1.8 10*3/uL (ref 0.9–3.3)
LYMPH%: 34.6 % (ref 14.0–49.7)
MCH: 33.3 pg (ref 25.1–34.0)
MCHC: 29.3 g/dL — AB (ref 31.5–36.0)
MCV: 113.8 fL — ABNORMAL HIGH (ref 79.5–101.0)
MONO#: 0.4 10*3/uL (ref 0.1–0.9)
MONO%: 7.9 % (ref 0.0–14.0)
NEUT%: 55 % (ref 38.4–76.8)
NEUTROS ABS: 2.9 10*3/uL (ref 1.5–6.5)
Platelets: 186 10*3/uL (ref 145–400)
RBC: 3.84 10*6/uL (ref 3.70–5.45)
RDW: 15.1 % — ABNORMAL HIGH (ref 11.2–14.5)
WBC: 5.3 10*3/uL (ref 3.9–10.3)

## 2014-01-31 LAB — COMPREHENSIVE METABOLIC PANEL (CC13)
ALK PHOS: 59 U/L (ref 40–150)
ALT: 16 U/L (ref 0–55)
ANION GAP: 11 meq/L (ref 3–11)
AST: 18 U/L (ref 5–34)
Albumin: 3.6 g/dL (ref 3.5–5.0)
BUN: 28.1 mg/dL — AB (ref 7.0–26.0)
CO2: 28 meq/L (ref 22–29)
Calcium: 10.5 mg/dL — ABNORMAL HIGH (ref 8.4–10.4)
Chloride: 102 mEq/L (ref 98–109)
Creatinine: 1.3 mg/dL — ABNORMAL HIGH (ref 0.6–1.1)
GLUCOSE: 160 mg/dL — AB (ref 70–140)
Potassium: 4.2 mEq/L (ref 3.5–5.1)
SODIUM: 140 meq/L (ref 136–145)
TOTAL PROTEIN: 7.4 g/dL (ref 6.4–8.3)
Total Bilirubin: 0.39 mg/dL (ref 0.20–1.20)

## 2014-01-31 LAB — MORPHOLOGY
PLT EST: ADEQUATE
RBC Comments: NORMAL

## 2014-02-03 LAB — UPEP/TP, 24-HR URINE
Collection Interval: 24 hours
TOTAL PROTEIN, URINE: 8 mg/dL
Total Protein, Urine/Day: 56 mg/d (ref 50–100)
Total Volume, Urine: 700 mL

## 2014-02-03 LAB — UIFE/LIGHT CHAINS/TP QN, 24-HR UR
ALBUMIN, U: DETECTED
Alpha 1, Urine: DETECTED — AB
Alpha 2, Urine: DETECTED — AB
Beta, Urine: DETECTED — AB
GAMMA UR: DETECTED — AB
TIME-UPE24: 24 h
TOTAL PROTEIN, URINE-UPE24: 8 mg/dL (ref 5–24)
TOTAL PROTEIN, URINE-UR/DAY: 56 mg/d (ref <150)
VOLUME, URINE-UPE24: 700 mL

## 2014-02-03 LAB — SPEP & IFE WITH QIG
Albumin ELP: 53.6 % — ABNORMAL LOW (ref 55.8–66.1)
Alpha-1-Globulin: 4.3 % (ref 2.9–4.9)
Alpha-2-Globulin: 12.7 % — ABNORMAL HIGH (ref 7.1–11.8)
BETA 2: 5.6 % (ref 3.2–6.5)
Beta Globulin: 5.6 % (ref 4.7–7.2)
GAMMA GLOBULIN: 18.2 % (ref 11.1–18.8)
IGA: 280 mg/dL (ref 69–380)
IGM, SERUM: 127 mg/dL (ref 52–322)
IgG (Immunoglobin G), Serum: 1150 mg/dL (ref 690–1700)
Total Protein, Serum Electrophoresis: 7.3 g/dL (ref 6.0–8.3)

## 2014-02-03 LAB — VITAMIN B12: Vitamin B-12: 1192 pg/mL — ABNORMAL HIGH (ref 211–911)

## 2014-02-03 LAB — KAPPA/LAMBDA LIGHT CHAINS
Kappa free light chain: 6.12 mg/dL — ABNORMAL HIGH (ref 0.33–1.94)
Kappa:Lambda Ratio: 2.3 — ABNORMAL HIGH (ref 0.26–1.65)
Lambda Free Lght Chn: 2.66 mg/dL — ABNORMAL HIGH (ref 0.57–2.63)

## 2014-02-03 LAB — BETA 2 MICROGLOBULIN, SERUM: Beta-2 Microglobulin: 4.11 mg/L — ABNORMAL HIGH (ref <=2.51)

## 2014-02-04 LAB — KAPPA/LAMBDA LIGHT CHAINS, FREE, WITH RATIO, 24HR. URINE
KAPPA Ligh Chain, Free U: 10.9 mg/L (ref 1.35–24.19)
KAPPA/LAMBDA, FREE RATIO: 4.98 (ref 2.04–10.37)
LAMBDA LIGHT CHAIN, FREE U: 2.19 mg/L (ref 0.24–6.66)

## 2014-02-07 ENCOUNTER — Ambulatory Visit: Payer: Medicare Other | Admitting: Hematology and Oncology

## 2014-02-08 ENCOUNTER — Telehealth: Payer: Self-pay | Admitting: *Deleted

## 2014-02-08 ENCOUNTER — Telehealth: Payer: Self-pay | Admitting: Hematology and Oncology

## 2014-02-08 NOTE — Telephone Encounter (Signed)
pt called to r/s missed appt....pt ok adn aware of new d.t

## 2014-02-08 NOTE — Telephone Encounter (Signed)
Pt missed her MD appt yesterday w/ Dr. Alvy Bimler.  She said she thought it was today.  She has been re-scheduled to 12/30 and told that is Dr. Calton Dach next available appt..  Pt asks what her test results showed and asks if she can have results before 12/30?

## 2014-02-09 NOTE — Telephone Encounter (Signed)
I do not have time to call and discuss over telephone. Minor abnormalities, not to worry

## 2014-02-11 ENCOUNTER — Telehealth: Payer: Self-pay | Admitting: *Deleted

## 2014-02-11 NOTE — Telephone Encounter (Signed)
Informed pt lab results show some abnormalities but nothing to worry about per Dr. Alvy Bimler.  She will review results with pt on next visit.  Pt verbalized understanding.

## 2014-03-01 ENCOUNTER — Telehealth: Payer: Self-pay | Admitting: Hematology and Oncology

## 2014-03-01 ENCOUNTER — Encounter: Payer: Self-pay | Admitting: Hematology and Oncology

## 2014-03-01 ENCOUNTER — Ambulatory Visit (HOSPITAL_BASED_OUTPATIENT_CLINIC_OR_DEPARTMENT_OTHER): Payer: Commercial Managed Care - HMO | Admitting: Hematology and Oncology

## 2014-03-01 VITALS — BP 147/55 | HR 74 | Temp 98.1°F | Resp 18 | Ht 63.0 in | Wt 192.1 lb

## 2014-03-01 DIAGNOSIS — R768 Other specified abnormal immunological findings in serum: Secondary | ICD-10-CM

## 2014-03-01 DIAGNOSIS — D7589 Other specified diseases of blood and blood-forming organs: Secondary | ICD-10-CM

## 2014-03-01 NOTE — Telephone Encounter (Signed)
gv and printed appt sched and avs forpt for March 2016 °

## 2014-03-01 NOTE — Progress Notes (Signed)
[place] OFFICE PROGRESS NOTE  [name] Coma, MD SUMMARY OF HEMATOLOGIC HISTORY: [name] Church 79 y.o. female is here because of abnormal blood work detected on recent routine primary care visit.  She denies history of abnormal bone pain. She have chronic arthritis. Extensive workup in December 2015 excluded multiple myeloma  INTERVAL HISTORY: [name] Church 79 y.o. female returns for further follow-up. She denies pain. She has intermittently swelling and she is taking Lasix.  I have reviewed the past medical history, past surgical history, social history and family history with the patient and they are unchanged from previous note.  ALLERGIES:  is allergic to sulfa antibiotics.  MEDICATIONS:  Current Outpatient Prescriptions  Medication Sig Dispense Refill  . aspirin 325 MG EC tablet Take 325 mg by mouth daily.    . furosemide (LASIX) 20 MG tablet Take 20 mg by mouth.    . hydrochlorothiazide (MICROZIDE) 12.5 MG capsule Take 12.5 mg by mouth daily.    . insulin glargine (LANTUS) 100 UNIT/ML injection Inject 14 Units into the skin 2 (two) times daily.    . Insulin Lispro, Human, (HUMALOG [place]) Inject into the skin 2 (two) times daily.    . Iron 66 MG TABS Take by mouth.    . levothyroxine (SYNTHROID, LEVOTHROID) 137 MCG tablet Take 137 mcg by mouth daily before breakfast.    . lisinopril (PRINIVIL,ZESTRIL) 5 MG tablet Take 5 mg by mouth daily.    . Multiple Minerals-Vitamins (CALCIUM & VIT D3 BONE HEALTH PO) Take by mouth.    . Multiple Vitamins-Minerals (MULTIVITAMIN WITH MINERALS) tablet Take 1 tablet by mouth daily.    . Vitamin D, Cholecalciferol, 1000 UNITS TABS Take by mouth.     No current facility-administered medications for this visit.     REVIEW OF SYSTEMS:   Constitutional: Denies fevers, chills or night sweats Eyes: Denies blurriness of vision Ears, nose, mouth, throat, and face: Denies mucositis or sore throat Respiratory: Denies cough,  dyspnea or wheezes Cardiovascular: Denies palpitation, chest discomfort  Gastrointestinal:  Denies nausea, heartburn or change in bowel habits Skin: Denies abnormal skin rashes Lymphatics: Denies new lymphadenopathy or easy bruising Neurological:Denies numbness, tingling or new weaknesses Behavioral/Psych: Mood is stable, no new changes  All other systems were reviewed with the patient and are negative.  PHYSICAL EXAMINATION: ECOG PERFORMANCE STATUS: 0 - Asymptomatic  Filed Vitals:   03/01/14 0824  BP: 147/55  Pulse: 74  Temp: 98.1 F (36.7 C)  Resp: 18   Filed Weights   03/01/14 0824  Weight: 192 lb 1.6 oz (87.136 kg)    GENERAL:alert, no distress and comfortable SKIN: skin color, texture, turgor are normal, no rashes or significant lesions EYES: normal, Conjunctiva are pink and non-injected, sclera clear ABDOMEN:abdomen soft, non-tender and normal bowel sounds Musculoskeletal:no cyanosis of digits and no clubbing  NEURO: alert & oriented x 3 with fluent speech, no focal motor/sensory deficits  LABORATORY DATA:  I have reviewed the data as listed No results found for this or any previous visit (from the past 48 hour(s)).  Lab Results  Component Value Date   WBC 5.3 01/31/2014   HGB 12.8 01/31/2014   HCT 43.7 01/31/2014   MCV 113.8* 01/31/2014   PLT 186 01/31/2014   Peripheral blood smear was reviewed and the red blood cell, white blood cell and platelet morphology were within normal limits RADIOGRAPHIC STUDIES: I reviewed the skeletal survey with the patient that was negative I have personally reviewed the radiological images as  listed and agreed with the findings in the report.  ASSESSMENT & PLAN:  Elevated serum immunoglobulin free light chain level She does not has any evidence of organ damage to suggest this is related to multiple myeloma. I recommend ordering repeat blood tests in 3 months. I reassured the patient.   Macrocytosis without anemia The elevated  MCV could be due to the fatty liver congestion even though her liver function tests were within normal limits. Serum vitamin B-12 and extensive workup for multiple myeloma was negative. Review of her peripheral smear is unremarkable. I recommend close observation for now.        All questions were answered. The patient knows to call the clinic with any problems, questions or concerns. No barriers to learning was detected.  I spent 25 minutes counseling the patient face to face. The total time spent in the appointment was 30 minutes and more than 50% was on counseling.     [place], [place], MD 03/01/2014 11:21 AM

## 2014-03-01 NOTE — Assessment & Plan Note (Signed)
The elevated MCV could be due to the fatty liver congestion even though her liver function tests were within normal limits. Serum vitamin B-12 and extensive workup for multiple myeloma was negative. Review of her peripheral smear is unremarkable. I recommend close observation for now.

## 2014-03-01 NOTE — Assessment & Plan Note (Signed)
She does not has any evidence of organ damage to suggest this is related to multiple myeloma. I recommend ordering repeat blood tests in 3 months. I reassured the patient.

## 2014-03-10 ENCOUNTER — Other Ambulatory Visit: Payer: Self-pay | Admitting: Hematology and Oncology

## 2014-04-13 ENCOUNTER — Encounter: Payer: Self-pay | Admitting: Podiatrist

## 2014-04-13 ENCOUNTER — Ambulatory Visit (INDEPENDENT_AMBULATORY_CARE_PROVIDER_SITE_OTHER): Payer: Medicare Other | Admitting: Podiatrist

## 2014-04-13 DIAGNOSIS — M79676 Pain in unspecified toe(s): Secondary | ICD-10-CM | POA: Diagnosis not present

## 2014-04-13 DIAGNOSIS — B351 Tinea unguium: Secondary | ICD-10-CM | POA: Diagnosis not present

## 2014-04-13 NOTE — Progress Notes (Signed)
   Subjective: Patient presents today for followup of diabetic foot and nail care. She has been treating the right great toenail with topical antifungal over-the-counter solution at the prescription strength ones were too expensive.  Physical Exam  GENERAL APPEARANCE: Alert, conversant. Appropriately groomed. No acute distress.  VASCULAR: Pedal pulses palpable at 1/4 DP and PT bilateral. Capillary refill time is immediate to all digits, Proximal to distal cooling it warm to warm. Digital hair growth is present bilateral  NEUROLOGIC: sensation is intact epicritically and protectively to 5.07 monofilament at 3/5 sites bilateral. Light touch is intact bilateral, vibratory sensation intact bilateral, achilles tendon reflex is intact bilateral.  MUSCULOSKELETAL: right foot is rectus. Left foot has a flat foot appearance and posterior tibial tendonitis in the past that Dr. Sharol Given has treated with a boot immobilization.  DERMATOLOGIC: toenails are thick and discolored esp bilateral first . All nails are elongated and symptomatic bilateral. No open lesions present. No interdigital maceration seen   Assessment: Diabetes with neuropathy, symptomatic mycotic toenails  Plan: Debridement of toenails was carried out today without complication. She will return in 3 1/2 to 4 months for continued follow up

## 2014-05-30 ENCOUNTER — Other Ambulatory Visit (HOSPITAL_BASED_OUTPATIENT_CLINIC_OR_DEPARTMENT_OTHER): Payer: Medicare Other

## 2014-05-30 ENCOUNTER — Ambulatory Visit (HOSPITAL_BASED_OUTPATIENT_CLINIC_OR_DEPARTMENT_OTHER): Payer: Medicare Other | Admitting: Hematology and Oncology

## 2014-05-30 VITALS — BP 126/52 | HR 84 | Temp 97.7°F | Resp 18 | Ht 63.0 in | Wt 186.9 lb

## 2014-05-30 DIAGNOSIS — R768 Other specified abnormal immunological findings in serum: Secondary | ICD-10-CM | POA: Diagnosis not present

## 2014-05-30 DIAGNOSIS — D7589 Other specified diseases of blood and blood-forming organs: Secondary | ICD-10-CM | POA: Diagnosis not present

## 2014-05-30 LAB — LACTATE DEHYDROGENASE (CC13): LDH: 187 U/L (ref 125–245)

## 2014-05-30 LAB — COMPREHENSIVE METABOLIC PANEL (CC13)
ALBUMIN: 3.1 g/dL — AB (ref 3.5–5.0)
ALK PHOS: 52 U/L (ref 40–150)
ALT: 18 U/L (ref 0–55)
ANION GAP: 12 meq/L — AB (ref 3–11)
AST: 18 U/L (ref 5–34)
BILIRUBIN TOTAL: 0.31 mg/dL (ref 0.20–1.20)
BUN: 32.8 mg/dL — ABNORMAL HIGH (ref 7.0–26.0)
CALCIUM: 9.7 mg/dL (ref 8.4–10.4)
CO2: 25 meq/L (ref 22–29)
CREATININE: 1.4 mg/dL — AB (ref 0.6–1.1)
Chloride: 103 mEq/L (ref 98–109)
EGFR: 35 mL/min/{1.73_m2} — ABNORMAL LOW (ref >90)
GLUCOSE: 298 mg/dL — AB (ref 70–140)
POTASSIUM: 3.7 meq/L (ref 3.5–5.1)
SODIUM: 140 meq/L (ref 136–145)
Total Protein: 7 g/dL (ref 6.4–8.3)

## 2014-05-30 LAB — CBC & DIFF AND RETIC
BASO%: 0.5 % (ref 0.0–2.0)
Basophils Absolute: 0 10*3/uL (ref 0.0–0.1)
EOS%: 1.5 % (ref 0.0–7.0)
Eosinophils Absolute: 0.1 10*3/uL (ref 0.0–0.5)
HEMATOCRIT: 37.2 % (ref 34.8–46.6)
HGB: 12.6 g/dL (ref 11.6–15.9)
Immature Retic Fract: 4.3 % (ref 1.60–10.00)
LYMPH%: 36.2 % (ref 14.0–49.7)
MCH: 32.6 pg (ref 25.1–34.0)
MCHC: 33.9 g/dL (ref 31.5–36.0)
MCV: 96.4 fL (ref 79.5–101.0)
MONO#: 0.4 10*3/uL (ref 0.1–0.9)
MONO%: 10.4 % (ref 0.0–14.0)
NEUT%: 51.4 % (ref 38.4–76.8)
NEUTROS ABS: 2.1 10*3/uL (ref 1.5–6.5)
Platelets: 176 10*3/uL (ref 145–400)
RBC: 3.86 10*6/uL (ref 3.70–5.45)
RDW: 13 % (ref 11.2–14.5)
Retic %: 0.97 % (ref 0.70–2.10)
Retic Ct Abs: 37.44 10*3/uL (ref 33.70–90.70)
WBC: 4.1 10*3/uL (ref 3.9–10.3)
lymph#: 1.5 10*3/uL (ref 0.9–3.3)

## 2014-05-30 NOTE — Assessment & Plan Note (Signed)
This has resolved. She had extensive workup last year which came back negative in general. The cause of macrocytosis is unknown but since it had resolved, she does not need return appointment to come back.

## 2014-05-30 NOTE — Progress Notes (Signed)
[place] OFFICE PROGRESS NOTE  [name] Coma, MD SUMMARY OF HEMATOLOGIC HISTORY: [name] Church 80 y.o. female is here because of abnormal blood work, with macrocytosis without anemia, detected on recent routine primary care visit.  She denies history of abnormal bone pain. She have chronic arthritis. Extensive workup in December 2015 excluded multiple myeloma INTERVAL HISTORY: [name] Church 80 y.o. female returns for further follow-up. She feels fine. The patient denies any recent signs or symptoms of bleeding such as spontaneous epistaxis, hematuria or hematochezia. Denies recent infection. No new bone pain.  I have reviewed the past medical history, past surgical history, social history and family history with the patient and they are unchanged from previous note.  ALLERGIES:  is allergic to sulfa antibiotics.  MEDICATIONS:  Current Outpatient Prescriptions  Medication Sig Dispense Refill  . aspirin 325 MG EC tablet Take 325 mg by mouth daily.    . furosemide (LASIX) 20 MG tablet Take 20 mg by mouth.    Marland Kitchen HUMALOG KWIKPEN 100 UNIT/ML KiwkPen 2 (two) times daily.  5  . hydrochlorothiazide (MICROZIDE) 12.5 MG capsule Take 12.5 mg by mouth daily.    . insulin glargine (LANTUS) 100 UNIT/ML injection Inject 14 Units into the skin 2 (two) times daily.    . Iron 66 MG TABS Take by mouth.    . levothyroxine (SYNTHROID, LEVOTHROID) 137 MCG tablet Take 137 mcg by mouth daily before breakfast.    . lisinopril (PRINIVIL,ZESTRIL) 5 MG tablet Take 5 mg by mouth daily.    . Multiple Minerals-Vitamins (CALCIUM & VIT D3 BONE HEALTH PO) Take by mouth.    . Multiple Vitamins-Minerals (MULTIVITAMIN WITH MINERALS) tablet Take 1 tablet by mouth daily.    . Vitamin D, Cholecalciferol, 1000 UNITS TABS Take by mouth.     No current facility-administered medications for this visit.     REVIEW OF SYSTEMS:   Constitutional: Denies fevers, chills or night sweats Eyes: Denies  blurriness of vision Ears, nose, mouth, throat, and face: Denies mucositis or sore throat Respiratory: Denies cough, dyspnea or wheezes Cardiovascular: Denies palpitation, chest discomfort or lower extremity swelling Gastrointestinal:  Denies nausea, heartburn or change in bowel habits Skin: Denies abnormal skin rashes Lymphatics: Denies new lymphadenopathy or easy bruising Neurological:Denies numbness, tingling or new weaknesses Behavioral/Psych: Mood is stable, no new changes  All other systems were reviewed with the patient and are negative.  PHYSICAL EXAMINATION: ECOG PERFORMANCE STATUS: 0 - Asymptomatic  Filed Vitals:   05/30/14 0919  BP: 126/52  Pulse: 84  Temp: 97.7 F (36.5 C)  Resp: 18   Filed Weights   05/30/14 0919  Weight: 186 lb 14.4 oz (84.777 kg)    GENERAL:alert, no distress and comfortable SKIN: skin color, texture, turgor are normal, no rashes or significant lesions EYES: normal, Conjunctiva are pink and non-injected, sclera clear Musculoskeletal:no cyanosis of digits and no clubbing  NEURO: alert & oriented x 3 with fluent speech, no focal motor/sensory deficits  LABORATORY DATA:  I have reviewed the data as listed Results for orders placed or performed in visit on 05/30/14 (from the past 48 hour(s))  CBC & Diff and Retic     Status: None   Collection Time: 05/30/14  8:54 AM  Result Value Ref Range   WBC 4.1 3.9 - 10.3 10e3/uL   NEUT# 2.1 1.5 - 6.5 10e3/uL   HGB 12.6 11.6 - 15.9 g/dL   HCT 37.2 34.8 - 46.6 %   Platelets 176 145 - 400  10e3/uL   MCV 96.4 79.5 - 101.0 fL   MCH 32.6 25.1 - 34.0 pg   MCHC 33.9 31.5 - 36.0 g/dL   RBC 3.86 3.70 - 5.45 10e6/uL   RDW 13.0 11.2 - 14.5 %   lymph# 1.5 0.9 - 3.3 10e3/uL   MONO# 0.4 0.1 - 0.9 10e3/uL   Eosinophils Absolute 0.1 0.0 - 0.5 10e3/uL   Basophils Absolute 0.0 0.0 - 0.1 10e3/uL   NEUT% 51.4 38.4 - 76.8 %   LYMPH% 36.2 14.0 - 49.7 %   MONO% 10.4 0.0 - 14.0 %   EOS% 1.5 0.0 - 7.0 %   BASO% 0.5 0.0  - 2.0 %   Retic % 0.97 0.70 - 2.10 %   Retic Ct Abs 37.44 33.70 - 90.70 10e3/uL   Immature Retic Fract 4.30 1.60 - 10.00 %    Lab Results  Component Value Date   WBC 4.1 05/30/2014   HGB 12.6 05/30/2014   HCT 37.2 05/30/2014   MCV 96.4 05/30/2014   PLT 176 05/30/2014    ASSESSMENT & PLAN:  Elevated serum immunoglobulin free light chain level Repeat light chain levels are still pending. I suspect it will be normal. The patient is not symptomatic.   Macrocytosis without anemia This has resolved. She had extensive workup last year which came back negative in general. The cause of macrocytosis is unknown but since it had resolved, she does not need return appointment to come back.    All questions were answered. The patient knows to call the clinic with any problems, questions or concerns. No barriers to learning was detected.  I spent 15 minutes counseling the patient face to face. The total time spent in the appointment was 20 minutes and more than 50% was on counseling.     [facility], Shallon Yaklin, MD 3/29/20169:39 AM

## 2014-05-30 NOTE — Assessment & Plan Note (Signed)
Repeat light chain levels are still pending. I suspect it will be normal. The patient is not symptomatic.

## 2014-05-31 LAB — DIRECT ANTIGLOBULIN RFX ANTI-C3/IGG: DAT, POLYSPECIFIC: NEGATIVE

## 2014-05-31 LAB — KAPPA/LAMBDA LIGHT CHAINS
KAPPA FREE LGHT CHN: 8.61 mg/dL — AB (ref 0.33–1.94)
Kappa:Lambda Ratio: 2.77 — ABNORMAL HIGH (ref 0.26–1.65)
LAMBDA FREE LGHT CHN: 3.11 mg/dL — AB (ref 0.57–2.63)

## 2014-06-24 NOTE — Op Note (Signed)
PATIENT NAME:  [name] Church, [name] Church MR#:  [id number] DATE OF BIRTH:  April 26, 1934  DATE OF PROCEDURE:  03/09/2013  PROCEDURE PERFORMED: 1.  Pars plana vitrectomy of the left eye.  3.  Panretinal photocoagulation the left eye.   PREOPERATIVE DIAGNOSES:  1.  Dense vitreous hemorrhage.  2.  Proliferative diabetic retinopathy, left eye.   POSTOPERATIVE DIAGNOSES: 1.  Dense vitreous hemorrhage.  2.  Proliferative diabetic retinopathy, left eye.   ESTIMATED BLOOD LOSS: Less than 1 mL.  PRIMARY SURGEON: Garlan Fair, M.D.   ANESTHESIA: General endotracheal anesthesia with a supplemental retrobulbar block.   COMPLICATIONS: None.   INDICATIONS FOR PROCEDURE: This is a patient who presented to my office with loss of vision in the left eye. Examination revealed a non-clearing vitreous hemorrhage covering the posterior pole and small areas of neovascularization. Risks, benefits, and alternatives of the above procedure were discussed and the patient wished to proceed.   DETAILS OF PROCEDURE: After informed consent was obtained, the patient was brought into the operative suite at [facility]. The patient was placed in supine position, was induced by the anesthesia team without complication. A retrobulbar block was performed on the left eye by the primary surgeon without any complications. The left eye was prepped and draped in sterile manner. After lid speculum was inserted, a 25-gauge trocar was placed inferotemporally through displaced conjunctiva in an oblique fashion 3 mm beyond the limbus. The infusion cannula was turned on and inserted through the trocar and secured in position with Steri-Strips. Two more trocars were placed in a similar fashion superotemporally and superonasally. The vitreous cutter and light pipe were introduced in the eye and a core vitrectomy was performed. Vitreous face was elevated with traction from the cutter without complication. Peripheral vitreous was  trimmed for 360 degrees to remove as much blood as possible out to the vitreous base. Endolaser was introduced and 360 degrees of panretinal photocoagulation for total of 2674 spots was applied. Scleral depression revealed no signs of breaks or tears for 360 degrees. Direct visualization was utilized for inferior vitrectomy in order to remove as much blood as safely possible. No breaks or tears could be identified afterwards. A partial air-fluid exchange was performed and the trocars were removed. There were small leaks superotemporally and superonasally and these sclerotomies were closed using plain 6-0 gut. The eye was pressurized to 15 to 20 mmHg. Dexamethasone 5 mg was given into the inferior fornix and the lid speculum was removed. The eye was cleaned and TobraDex was placed on the eye. A patch and shield were placed over the eye. The patient was taken to postanesthesia care with instructions to remain head up.    ____________________________ Teresa Pelton. Starling Manns, MD mfa:aw D: 03/09/2013 08:26:15 ET T: 03/09/2013 08:46:49 ET JOB#: [id number]  cc: Teresa Pelton. Starling Manns, MD, <Dictator> Coralee Rud MD ELECTRONICALLY SIGNED 03/23/2013 7:10

## 2014-06-24 NOTE — Op Note (Signed)
PATIENT NAME:  [name] Church, [name] Church MR#:  [id number] DATE OF BIRTH:  1934/04/15  ACCOUNT NUMBER: [id number]  DATE OF PROCEDURE:  03/09/2013  PROCEDURE PERFORMED: Panretinal photocoagulation of the right eye.   PREOPERATIVE DIAGNOSIS: Proliferative diabetic retinopathy of the right eye.   POSTOPERATIVE DIAGNOSIS: Proliferative diabetic retinopathy of the right eye.   ESTIMATED BLOOD LOSS: Less than 1 mL.  PRIMARY SURGEON: Teresa Pelton. Felita Bump, MD  ANESTHESIA: General endotracheal anesthesia.   COMPLICATIONS: None.   INDICATIONS FOR PROCEDURE: The patient presented to my office with loss of vision in her left eye. Examination revealed neovascularization of the right eye. Risks, benefits and alternatives of the above procedure were discussed, and the patient wished to proceed.   DETAILS: After informed consent was obtained, the patient was brought into the operative suite at [facility]. The patient was placed in supine position, was induced by the anesthesia team without complication. The right eye was opened using a lid speculum. Scleral depression was utilized for 360 degrees to aid in indirect panretinal photocoagulation for 360 degrees. A total of 2634 spots was applied for 360 degrees. The laser was taken from the arcades out to the ora serrata for 360 degrees. At the conclusion of laser, the eye was rinsed, and lubricating ointment was placed on the eye, and a piece of tape was utilized to keep the eye closed for the duration of the rest of the procedure of the left eye.    ____________________________ Teresa Pelton. Starling Manns, MD mfa:lb D: 03/09/2013 98:33:82 ET T: 03/09/2013 08:37:09 ET JOB#: [id number]  cc: Teresa Pelton. Starling Manns, MD, <Dictator> Coralee Rud MD ELECTRONICALLY SIGNED 03/23/2013 7:10

## 2014-08-10 ENCOUNTER — Encounter: Payer: Self-pay | Admitting: Podiatry

## 2014-08-10 ENCOUNTER — Ambulatory Visit (INDEPENDENT_AMBULATORY_CARE_PROVIDER_SITE_OTHER): Payer: Medicare Other | Admitting: Podiatry

## 2014-08-10 DIAGNOSIS — M79672 Pain in left foot: Secondary | ICD-10-CM | POA: Diagnosis not present

## 2014-08-10 DIAGNOSIS — B351 Tinea unguium: Secondary | ICD-10-CM

## 2014-08-10 DIAGNOSIS — E1140 Type 2 diabetes mellitus with diabetic neuropathy, unspecified: Secondary | ICD-10-CM

## 2014-08-10 NOTE — Progress Notes (Signed)
Patient ID: [name] Church, female   DOB: June 15, 1934, 80 y.o.   MRN: [id number] @BARCODE2D (Error - No data availableComplaint:  Visit Type: Patient returns to my office for continued preventative foot care services. Complaint: Patient states" my nails have grown long and thick and become painful to walk and wear shoes" . She presents for preventative foot care services. No changes to ROS.  Patient is diabetic with neuropathy.  Podiatric Exam: Vascular: dorsalis pedis and posterior tibial pulses are palpable bilateral. Capillary return is immediate. Temperature gradient is WNL. Skin turgor WNL  Sensorium: Normal Semmes Weinstein monofilament test. Normal tactile sensation bilaterally. Nail Exam: Pt has thick disfigured discolored nails with subungual debris noted bilateral entire nail hallux through fifth toenails Ulcer Exam: There is no evidence of ulcer or pre-ulcerative changes or infection. Orthopedic Exam: Muscle tone and strength are WNL. No limitations in general ROM. No crepitus or effusions noted. Foot type and digits show no abnormalities. Bony prominences are unremarkable. Skin: No Porokeratosis. No infection or ulcers  Diagnosis:  Tinea unguium, Pain in right toe, pain in left toes  Treatment & Plan Procedures and Treatment: Consent by patient was obtained for treatment procedures. The patient understood the discussion of treatment and procedures well. All questions were answered thoroughly reviewed. Debridement of mycotic and hypertrophic toenails, 1 through 5 bilateral and clearing of subungual debris. No ulceration, no infection noted.  Return Visit-Office Procedure: Patient instructed to return to the office for a follow up visit 3 months for continued evaluation and treatment.Marland Kitchen)@

## 2014-11-09 ENCOUNTER — Ambulatory Visit: Payer: Medicare Other | Admitting: Podiatry

## 2014-12-12 ENCOUNTER — Ambulatory Visit (INDEPENDENT_AMBULATORY_CARE_PROVIDER_SITE_OTHER): Payer: Medicare Other | Admitting: Podiatry

## 2014-12-12 ENCOUNTER — Encounter: Payer: Self-pay | Admitting: Podiatry

## 2014-12-12 DIAGNOSIS — M79676 Pain in unspecified toe(s): Secondary | ICD-10-CM

## 2014-12-12 DIAGNOSIS — B351 Tinea unguium: Secondary | ICD-10-CM

## 2014-12-12 NOTE — Patient Instructions (Signed)
Diabetes and Foot Care Diabetes may cause you to have problems because of poor blood supply (circulation) to your feet and legs. This may cause the skin on your feet to become thinner, break easier, and heal more slowly. Your skin may become dry, and the skin may peel and crack. You may also have nerve damage in your legs and feet causing decreased feeling in them. You may not notice minor injuries to your feet that could lead to infections or more serious problems. Taking care of your feet is one of the most important things you can do for yourself.  HOME CARE INSTRUCTIONS  Wear shoes at all times, even in the house. Do not go barefoot. Bare feet are easily injured.  Check your feet daily for blisters, cuts, and redness. If you cannot see the bottom of your feet, use a mirror or ask someone for help.  Wash your feet with warm water (do not use hot water) and mild soap. Then pat your feet and the areas between your toes until they are completely dry. Do not soak your feet as this can dry your skin.  Apply a moisturizing lotion or petroleum jelly (that does not contain alcohol and is unscented) to the skin on your feet and to dry, brittle toenails. Do not apply lotion between your toes.  Trim your toenails straight across. Do not dig under them or around the cuticle. File the edges of your nails with an emery board or nail file.  Do not cut corns or calluses or try to remove them with medicine.  Wear clean socks or stockings every day. Make sure they are not too tight. Do not wear knee-high stockings since they may decrease blood flow to your legs.  Wear shoes that fit properly and have enough cushioning. To break in new shoes, wear them for just a few hours a day. This prevents you from injuring your feet. Always look in your shoes before you put them on to be sure there are no objects inside.  Do not cross your legs. This may decrease the blood flow to your feet.  If you find a minor scrape,  cut, or break in the skin on your feet, keep it and the skin around it clean and dry. These areas may be cleansed with mild soap and water. Do not cleanse the area with peroxide, alcohol, or iodine.  When you remove an adhesive bandage, be sure not to damage the skin around it.  If you have a wound, look at it several times a day to make sure it is healing.  Do not use heating pads or hot water bottles. They may burn your skin. If you have lost feeling in your feet or legs, you may not know it is happening until it is too late.  Make sure your health care provider performs a complete foot exam at least annually or more often if you have foot problems. Report any cuts, sores, or bruises to your health care provider immediately. SEEK MEDICAL CARE IF:   You have an injury that is not healing.  You have cuts or breaks in the skin.  You have an ingrown nail.  You notice redness on your legs or feet.  You feel burning or tingling in your legs or feet.  You have pain or cramps in your legs and feet.  Your legs or feet are numb.  Your feet always feel cold. SEEK IMMEDIATE MEDICAL CARE IF:   There is increasing redness,   swelling, or pain in or around a wound.  There is a red line that goes up your leg.  Pus is coming from a wound.  You develop a fever or as directed by your health care provider.  You notice a bad smell coming from an ulcer or wound.   This information is not intended to replace advice given to you by your health care provider. Make sure you discuss any questions you have with your health care provider.   Document Released: 02/15/2000 Document Revised: 10/20/2012 Document Reviewed: 07/27/2012 Elsevier Interactive Patient Education 2016 [employer]  

## 2014-12-12 NOTE — Progress Notes (Signed)
Patient ID: [name] Church, female   DOB: 03-04-1934, 80 y.o.   MRN: [id number]   Subjective: This patient presents for a scheduled visit complaining of painful toenails and requests toenail debridement  Objective: Orientated 3 No open skin lesions bilaterally The toenails are elongated, brittle, discolored, hypertrophic and tender to direct palpation 6-10  Assessment: Symptomatic onychomycoses 6-10  Plan: Debridement toenails 10 mechanically and electrically without any bleeding  Reappoint 3 months

## 2015-01-18 ENCOUNTER — Ambulatory Visit: Payer: Medicare Other | Attending: Family Medicine | Admitting: Physical Therapy

## 2015-01-18 ENCOUNTER — Encounter: Payer: Self-pay | Admitting: Physical Therapy

## 2015-01-18 DIAGNOSIS — R269 Unspecified abnormalities of gait and mobility: Secondary | ICD-10-CM | POA: Diagnosis present

## 2015-01-18 DIAGNOSIS — R29898 Other symptoms and signs involving the musculoskeletal system: Secondary | ICD-10-CM | POA: Insufficient documentation

## 2015-01-18 DIAGNOSIS — M545 Low back pain, unspecified: Secondary | ICD-10-CM

## 2015-01-18 NOTE — Therapy (Signed)
[facility] [address] [place], [place], [address] Phone: [phone]   Fax:  [phone]  Physical Therapy Evaluation  Patient Details  Name: [name] Church MRN: [id number] Date of Birth: December 18, 1934 Referring Provider: Dr. Jonathon Jordan  Encounter Date: 01/18/2015      PT End of Session - 01/18/15 1639    Visit Number 1   Number of Visits 10  Medicare   Date for PT Re-Evaluation 03/15/15   PT Start Time [id number]   PT Stop Time 1528   PT Time Calculation (min) 43 min   Activity Tolerance Patient tolerated treatment well   Behavior During Therapy [facility] for tasks assessed/performed      Past Medical History  Diagnosis Date  . Diabetes mellitus without complication ([place])   . Thyroid disease   . Edema   . Elevated serum immunoglobulin free light chain level 11/29/2013  . Macrocytosis without anemia 11/29/2013  . Unspecified deficiency anemia 11/29/2013  . Osteoporosis     Past Surgical History  Procedure Laterality Date  . Eye surgery Left 03/2013    eye bleeds internally  . Eye surgery Right 03/2013  . Fracture surgery Left 2012    leg with metal fixation  . Appendectomy      There were no vitals filed for this visit.  Visit Diagnosis:  Right-sided low back pain without sciatica - Plan: PT plan of care cert/re-cert  Abnormality of gait - Plan: PT plan of care cert/re-cert  Weakness of both legs - Plan: PT plan of care cert/re-cert      Subjective Assessment - 01/18/15 1449    Subjective I feel I have difficulty with balance. I feel my left leg is longer than the right.  When walking then stand at a counter feels like she will fall over or has back pain. Pateint feels this has been going on for 6 omnths.    How long can you sit comfortably? No difficulty   How long can you stand comfortably? 4 min   How long can you walk comfortably? 10 min   Patient Stated Goals improve balance and increase  mobility   Currently in Pain? Yes   Pain Score 4    Pain Location Back   Pain Orientation Mid   Pain Descriptors / Indicators Dull   Pain Type Chronic pain   Pain Onset More than a month ago  6 months   Pain Frequency Intermittent   Aggravating Factors  standing to do dishes   Pain Relieving Factors sit down   Multiple Pain Sites No            [facility] PT Assessment - 01/18/15 0001    Assessment   Medical Diagnosis M54.9 Dorsalgia; R26.89 Poor balance   Referring Provider Dr. Jonathon Jordan   Onset Date/Surgical Date 07/02/14   Prior Therapy None   Precautions   Precautions Other (comment)   Precaution Comments osteoporosis precautions   Balance Screen   Has the patient fallen in the past 6 months No   Has the patient had a decrease in activity level because of a fear of falling?  Yes   Is the patient reluctant to leave their home because of a fear of falling?  No   Prior Function   Level of Independence Independent   Cognition   Overall Cognitive Status Within Functional Limits for tasks assessed   Observation/Other Assessments   Focus on Therapeutic Outcomes (FOTO)  39% limitation CJ  ROM / Strength   AROM / PROM / Strength Strength;AROM   AROM   Overall AROM Comments no inversion or eversion movement of left ankle; lumbar ROM decreased by 50%   Strength   Overall Strength Comments left plantarflexion 2/5   Right Hip Flexion 4+/5   Right Hip Extension 3/5   Right Hip ABduction 3/5   Left Hip Flexion 4/5   Left Hip Extension 3/5   Left Hip ABduction 3-/5   Right Knee Extension 4/5   Left Knee Flexion 4/5   Left Knee Extension 3/5   Ambulation/Gait   Ambulation/Gait Yes   Gait Pattern Decreased dorsiflexion - left;Decreased weight shift to left;Decreased step length - left;Right hip hike   Stairs Yes   Stair Management Technique Step to pattern;Two rails  going down steps   Standardized Balance Assessment   Standardized Balance Assessment Timed Up and Go  Test;Five Times Sit to Stand   Five times sit to stand comments  18 with hands and without   Berg Balance Test   Sit to Stand Able to stand  independently using hands   Standing Unsupported Able to stand safely 2 minutes   Sitting with Back Unsupported but Feet Supported on Floor or Stool Able to sit safely and securely 2 minutes   Stand to Sit Controls descent by using hands   Transfers Able to transfer safely, definite need of hands   Standing Unsupported with Eyes Closed Able to stand 10 seconds safely   Standing Ubsupported with Feet Together Able to place feet together independently and stand 1 minute safely   From Standing, Reach Forward with Outstretched Arm Can reach forward >5 cm safely (2")   From Standing Position, Pick up Object from Floor Unable to try/needs assist to keep balance   From Standing Position, Turn to Look Behind Over each Shoulder Looks behind one side only/other side shows less weight shift  easier to right   Turn 360 Degrees Able to turn 360 degrees safely but slowly  7 sec   Standing Unsupported, Alternately Place Feet on Step/Stool Able to complete >2 steps/needs minimal assist  could do 8 steps in 30 sec with c.g.   Standing Unsupported, One Foot in [place] to take small step independently and hold 30 seconds   Standing on One Leg Tries to lift leg/unable to hold 3 seconds but remains standing independently   Total Score 36   Berg comment: 100% chance of falling   Timed Up and Go Test   TUG Normal TUG   Normal TUG (seconds) 15                           PT Education - 01/18/15 1638    Education provided Yes   Education Details sitting hip strength   Person(s) Educated Patient   Methods Explanation;Demonstration;Handout;Verbal cues   Comprehension Verbalized understanding;Returned demonstration          PT Short Term Goals - 01/18/15 1646    PT SHORT TERM GOAL #1   Title independent with initial HEP   Time 4   Period Weeks    Status New   PT SHORT TERM GOAL #2   Title Berg Balance score >/= 42/56 due to improved balance   Time 4   Period Weeks   Status New   PT SHORT TERM GOAL #3   Title lumbar pain with daily activities decreased >/= 25%   Time 4  Period Weeks   Status New   PT SHORT TERM GOAL #4   Title understand correct body mechanics to decrease strain on lumbar   Time 4   Period Weeks   Status New   PT SHORT TERM GOAL #5   Title understand she is to walk with a SPC on the right to reduce chane of falling   Time 4   Period Weeks   Status New           PT Long Term Goals - 02-05-15 1649    PT LONG TERM GOAL #1   Title indpendent with HEP and understands how to progress herself   Time 8   Period Weeks   Status New   PT LONG TERM GOAL #2   Title lumbar pain decreased >/= 50% due to her stronge and has decreased gait deficits.    Time 8   Period Weeks   Status New   PT LONG TERM GOAL #3   Title Berg balance >/= 48/56 due to improved bil. LE strength   Time 8   Period Weeks   Status New   PT LONG TERM GOAL #4   Title understand ways to avoid falls    Time 8   Period Weeks   Status New               Plan - 02-05-2015 1639    Clinical Impression Statement Patient is a 80 year old female with lumbar pain and poor balance. Patient reports intermitttent lumbar pain at level 4/10 with standing.  TUG test is 15 second. Sit to stand and back 5 times was 18 second. Berg balance score is 36/56 indicating 100% chance of falling.  Lumbar ROM decresaed by 50%.  Patient goes up stairs with a step over step pattern pulling up on 2 railing.  She goes down with a step to step pattern with 2 handrails.  Right knee extension is 4/5.  Left knee extension is 3/5 and flexion 4/5.  Hip strength right/left/5: fleixon 4+/4/5, abduction 3/3-/5, and extension 3/3/5. Left plantarflexion 2/5.  Patient reports she is unsteady in staning. Patient ambulates with decrease heelstrike on the left, limps on left,  and hikes left hip up.  Patient will benefit fromp hysical therapy to improve balance and strength while reducing lumbar pain.    Pt will benefit from skilled therapeutic intervention in order to improve on the following deficits Abnormal gait;Decreased activity tolerance;Decreased balance;Decreased mobility;Decreased strength;Impaired flexibility;Pain;Decreased endurance;Increased muscle spasms;Increased fascial restricitons;Difficulty walking;Decreased range of motion   Rehab Potential Good   Clinical Impairments Affecting Rehab Potential None   PT Frequency 2x / week   PT Duration 8 weeks   PT Treatment/Interventions Cryotherapy;Electrical Stimulation;Moist Heat;Therapeutic exercise;Therapeutic activities;Gait training;Stair training;Ultrasound;Neuromuscular re-education;Patient/family education;Manual techniques;Energy conservation   PT Next Visit Plan Balance exercises, soft tissue work to lumbar, modalities as needed, bil. LE strength, gait training   PT Home Exercise Plan tips to avoid falls   Recommended Other Services [facility]   Consulted and Agree with Plan of Care Patient          G-Codes - Feb 05, 2015 1652    Functional Assessment Tool Used FOTO score is 39% limitation  goal is 30% limitation   Functional Limitation Other PT primary   Other PT Primary Current Status [id number]) At least 20 percent but less than 40 percent impaired, limited or restricted   Other PT Primary Goal Status [id number]) At least 20 percent but less than 40 percent impaired, limited or  restricted       Problem List Patient Active Problem List   Diagnosis Date Noted  . Elevated serum immunoglobulin free light chain level 11/29/2013  . Macrocytosis without anemia 11/29/2013  . Unspecified deficiency anemia 11/29/2013  . Edema     [name] Church,PT 01/18/2015, 4:54 PM  [facility] [address], [place], [place], [address] Phone: [phone]   Fax:   [phone]  Name: [name] Church MRN: [id number] Date of Birth: 1934-09-18

## 2015-01-18 NOTE — Patient Instructions (Signed)
HIP / KNEE: Flexion - Sitting    Raise knee to chest. Keep back straight, knee bent. _30__ reps per set, __1_ sets per day, __1_ days per week  Copyright  VHI. All rights reserved.  KNEE: Extension, Long Arc Quads - Sitting    Raise leg until knee is straight. _15__ reps per set, __2_ sets per day, __1_ days per week  Copyright  VHI. All rights reserved.  Foot: Heel Raises    Sitting, place right foot flat on floor, knee pointed forward. Lift heel off floor. Do not all5ow knee to tilt. Keep toes on floor. Hold __5__ seconds. Repeat _15___ times. Do ____ sessions per day. CAUTION: Repetitions should be slow and controlled.  Copyright  VHI. All rights reserved.   [facility] [address] [place], [place] [address] Phone # [phone] Fax [phone]

## 2015-01-23 ENCOUNTER — Ambulatory Visit: Payer: Medicare Other | Admitting: Physical Therapy

## 2015-01-23 ENCOUNTER — Encounter: Payer: Self-pay | Admitting: Physical Therapy

## 2015-01-23 DIAGNOSIS — R269 Unspecified abnormalities of gait and mobility: Secondary | ICD-10-CM

## 2015-01-23 DIAGNOSIS — M545 Low back pain, unspecified: Secondary | ICD-10-CM

## 2015-01-23 DIAGNOSIS — R29898 Other symptoms and signs involving the musculoskeletal system: Secondary | ICD-10-CM

## 2015-01-23 NOTE — Patient Instructions (Signed)

## 2015-01-23 NOTE — Therapy (Signed)
[facility] [address], [place], [place], [address] Phone: [phone]   Fax:  [phone]  Physical Therapy Treatment  Patient Details  Name: [name] Church MRN: [id number] Date of Birth: [birth year] Referring Provider: Dr. Jonathon Jordan  Encounter Date: 01/23/2015      PT End of Session - 01/23/15 1302    Visit Number 2   Number of Visits 10   Date for PT Re-Evaluation 03/15/15   PT Start Time 1230   PT Stop Time 1314   PT Time Calculation (min) 44 min   Activity Tolerance Patient tolerated treatment well   Behavior During Therapy [facility] for tasks assessed/performed      Past Medical History  Diagnosis Date  . Diabetes mellitus without complication ([place])   . Thyroid disease   . Edema   . Elevated serum immunoglobulin free light chain level 11/29/2013  . Macrocytosis without anemia 11/29/2013  . Unspecified deficiency anemia 11/29/2013  . Osteoporosis     Past Surgical History  Procedure Laterality Date  . Eye surgery Left 03/2013    eye bleeds internally  . Eye surgery Right 03/2013  . Fracture surgery Left 2012    leg with metal fixation  . Appendectomy      There were no vitals filed for this visit.  Visit Diagnosis:  Right-sided low back pain without sciatica  Abnormality of gait  Weakness of both legs      Subjective Assessment - 01/23/15 1251    Subjective My balance is off and feel weakness in B LE   Currently in Pain? No/denies                         [facility] Adult PT Treatment/Exercise - 01/23/15 0001    Exercises   Exercises Knee/Hip;Lumbar   Lumbar Exercises: Aerobic   Stationary Bike Nu-step L1 x 58min   Knee/Hip Exercises: Standing   Hip Abduction Stengthening;Both;2 sets;10 reps   Hip Extension Stengthening;Both;2 sets;10 reps   Rebounder 3 directions x 1 min each with both UE support   Knee/Hip Exercises: Seated   Long Arc Quad Both;3 sets;10 reps  3#                 PT Education - 01/23/15 1302    Education provided Yes   Education Details preventing falls    Person(s) Educated Patient   Methods Explanation;Handout   Comprehension Verbalized understanding          PT Short Term Goals - 01/23/15 1308    PT SHORT TERM GOAL #1   Title independent with initial HEP   Time 4   Period Weeks   Status On-going   PT SHORT TERM GOAL #2   Title Berg Balance score >/= 42/56 due to improved balance   Time 4   Period Weeks   Status On-going   PT SHORT TERM GOAL #3   Title lumbar pain with daily activities decreased >/= 25%   Time 4   Period Weeks   Status On-going   PT SHORT TERM GOAL #4   Title understand correct body mechanics to decrease strain on lumbar   Time 4   Period Weeks   Status On-going   PT SHORT TERM GOAL #5   Title understand she is to walk with a SPC on the right to reduce chane of falling   Time 4   Period Weeks   Status On-going  PT Long Term Goals - 01/18/15 1649    PT LONG TERM GOAL #1   Title indpendent with HEP and understands how to progress herself   Time 8   Period Weeks   Status New   PT LONG TERM GOAL #2   Title lumbar pain decreased >/= 50% due to her stronge and has decreased gait deficits.    Time 8   Period Weeks   Status New   PT LONG TERM GOAL #3   Title Berg balance >/= 48/56 due to improved bil. LE strength   Time 8   Period Weeks   Status New   PT LONG TERM GOAL #4   Title understand ways to avoid falls    Time 8   Period Weeks   Status New               Plan - 01/23/15 1304    Clinical Impression Statement Patient is a 80 year old female with low endurance and unsteady gait.    Pt will benefit from skilled therapeutic intervention in order to improve on the following deficits Abnormal gait;Decreased activity tolerance;Decreased balance;Decreased mobility;Decreased strength;Impaired flexibility;Pain;Decreased endurance;Increased muscle  spasms;Increased fascial restricitons;Difficulty walking;Decreased range of motion   Rehab Potential Good   PT Frequency 2x / week   PT Duration 8 weeks   PT Treatment/Interventions Cryotherapy;Electrical Stimulation;Moist Heat;Therapeutic exercise;Therapeutic activities;Gait training;Stair training;Ultrasound;Neuromuscular re-education;Patient/family education;Manual techniques;Energy conservation   PT Next Visit Plan Balance exercises, soft tissue work to lumbar, modalities as needed, bil. LE strength, gait training   PT Home Exercise Plan Add balance exercises   Consulted and Agree with Plan of Care Patient        Problem List Patient Active Problem List   Diagnosis Date Noted  . Elevated serum immunoglobulin free light chain level 11/29/2013  . Macrocytosis without anemia 11/29/2013  . Unspecified deficiency anemia 11/29/2013  . Edema     NAUMANN-HOUEGNIFIO,Henessy Rohrer 01/23/2015, 1:13 PM  [facility] [address], [place], [place], [address] Phone: [phone]   Fax:  [phone]  Name: CHARLANA CHAPMON MRN: [id number] Date of Birth: [birth year]

## 2015-02-01 ENCOUNTER — Ambulatory Visit: Payer: Medicare Other | Attending: Family Medicine | Admitting: Physical Therapy

## 2015-02-01 ENCOUNTER — Encounter: Payer: Self-pay | Admitting: Physical Therapy

## 2015-02-01 DIAGNOSIS — R269 Unspecified abnormalities of gait and mobility: Secondary | ICD-10-CM | POA: Insufficient documentation

## 2015-02-01 DIAGNOSIS — M545 Low back pain, unspecified: Secondary | ICD-10-CM

## 2015-02-01 DIAGNOSIS — R29898 Other symptoms and signs involving the musculoskeletal system: Secondary | ICD-10-CM | POA: Diagnosis present

## 2015-02-01 NOTE — Patient Instructions (Addendum)
Bridging HIP: Hamstrings - Short Sitting   Place heel on floor or rest leg on raised surface - keep your back straight and lean forward.  Hold 20 seconds.  Repeat with other leg _3  reps per set, with each leg   repeat 3 , 2-3  sets per day, Copyright  VHI. All rights reserved.

## 2015-02-01 NOTE — Therapy (Signed)
[facility] [address], [place], [place], [address] Phone: [phone]   Fax:  [phone]  Physical Therapy Treatment  Patient Details  Name: [name] Church MRN: [id number] Date of Birth: 26-Dec-1934 Referring Provider: Dr. Jonathon Jordan  Encounter Date: 02/01/2015      PT End of Session - 02/01/15 1414    Visit Number 3   Number of Visits 10   Date for PT Re-Evaluation 03/15/15   PT Start Time 1401   PT Stop Time 1444   PT Time Calculation (min) 43 min   Activity Tolerance Patient tolerated treatment well   Behavior During Therapy [facility] for tasks assessed/performed      Past Medical History  Diagnosis Date  . Diabetes mellitus without complication ([place])   . Thyroid disease   . Edema   . Elevated serum immunoglobulin free light chain level 11/29/2013  . Macrocytosis without anemia 11/29/2013  . Unspecified deficiency anemia 11/29/2013  . Osteoporosis     Past Surgical History  Procedure Laterality Date  . Eye surgery Left 03/2013    eye bleeds internally  . Eye surgery Right 03/2013  . Fracture surgery Left 2012    leg with metal fixation  . Appendectomy      There were no vitals filed for this visit.  Visit Diagnosis:  Right-sided low back pain without sciatica  Abnormality of gait  Weakness of both legs      Subjective Assessment - 02/01/15 1407    Subjective no complains of back pain, pt reports was able to do her dishes without need of sitting down.   Currently in Pain? No/denies                         [facility] Adult PT Treatment/Exercise - 02/01/15 0001    Exercises   Exercises Knee/Hip;Lumbar   Lumbar Exercises: Aerobic   Stationary Bike Nu-step L1 x 57min   Lumbar Exercises: Supine   Bridge 20 reps;2 seconds  limited in lifting up, but no discomfort   Other Supine Lumbar Exercises Adduction with ball 2 x 10   Knee/Hip Exercises: Standing   Hip Abduction  Stengthening;Both;3 sets;10 reps   Hip Extension Stengthening;Both;3 sets;10 reps   Rebounder 3 directions x 1 min each with both UE support   Knee/Hip Exercises: Seated   Long Arc Quad Both;3 sets;10 reps  3# added each leg                PT Education - 02/01/15 1431    Education provided Yes   Education Details Bridging, and Hamstring stretch in sitting   Person(s) Educated Patient   Methods Explanation;Handout   Comprehension Verbalized understanding          PT Short Term Goals - 02/01/15 1418    PT SHORT TERM GOAL #1   Title independent with initial HEP   Time 4   Period Weeks   Status On-going   PT SHORT TERM GOAL #2   Title Berg Balance score >/= 42/56 due to improved balance   Time 4   Period Weeks   Status On-going   PT SHORT TERM GOAL #3   Title lumbar pain with daily activities decreased >/= 25%   Time 4   Period Weeks   Status On-going   PT SHORT TERM GOAL #4   Title understand correct body mechanics to decrease strain on lumbar   Time 4   Period Weeks  Status On-going   PT SHORT TERM GOAL #5   Title understand she is to walk with a SPC on the right to reduce chane of falling   Time 4   Period Weeks   Status On-going           PT Long Term Goals - 01/18/15 1649    PT LONG TERM GOAL #1   Title indpendent with HEP and understands how to progress herself   Time 8   Period Weeks   Status New   PT LONG TERM GOAL #2   Title lumbar pain decreased >/= 50% due to her stronge and has decreased gait deficits.    Time 8   Period Weeks   Status New   PT LONG TERM GOAL #3   Title Berg balance >/= 48/56 due to improved bil. LE strength   Time 8   Period Weeks   Status New   PT LONG TERM GOAL #4   Title understand ways to avoid falls    Time 8   Period Weeks   Status New               Plan - 02/01/15 1414    Clinical Impression Statement Patient is a 80 y.o. female with low endurance and unsteady gait, at this time no complain  of back pain. pt able to perform gentle strengthening exercises well, and she will continue to improve with skilled PT   Pt will benefit from skilled therapeutic intervention in order to improve on the following deficits Abnormal gait;Decreased activity tolerance;Decreased balance;Decreased mobility;Decreased strength;Impaired flexibility;Pain;Decreased endurance;Increased muscle spasms;Increased fascial restricitons;Difficulty walking;Decreased range of motion   Rehab Potential Good   PT Frequency 2x / week   PT Duration 8 weeks   PT Treatment/Interventions Cryotherapy;Electrical Stimulation;Moist Heat;Therapeutic exercise;Therapeutic activities;Gait training;Stair training;Ultrasound;Neuromuscular re-education;Patient/family education;Manual techniques;Energy conservation   PT Next Visit Plan Balance exercises, soft tissue work to lumbar, modalities as needed, bil. LE strength, gait training   PT Home Exercise Plan Add balance exercises   Consulted and Agree with Plan of Care Patient        Problem List Patient Active Problem List   Diagnosis Date Noted  . Elevated serum immunoglobulin free light chain level 11/29/2013  . Macrocytosis without anemia 11/29/2013  . Unspecified deficiency anemia 11/29/2013  . Edema     [name] Church,[name] Church PTA 02/01/2015, 2:57 PM  [facility] [address], [place], [place], [address] Phone: [phone]   Fax:  [phone]  Name: [name] Church MRN: [id number] Date of Birth: 1934-06-06

## 2015-02-06 ENCOUNTER — Ambulatory Visit: Payer: Medicare Other | Admitting: Physical Therapy

## 2015-02-06 DIAGNOSIS — R29898 Other symptoms and signs involving the musculoskeletal system: Secondary | ICD-10-CM

## 2015-02-06 DIAGNOSIS — M545 Low back pain, unspecified: Secondary | ICD-10-CM

## 2015-02-06 DIAGNOSIS — R269 Unspecified abnormalities of gait and mobility: Secondary | ICD-10-CM

## 2015-02-06 NOTE — Therapy (Signed)
[facility] [address], [place], [place], [address] Phone: [phone]   Fax:  [phone]  Physical Therapy Treatment  Patient Details  Name: [name] Church MRN: [id number] Date of Birth: 1934-07-22 Referring Provider: Dr. Jonathon Jordan  Encounter Date: 02/06/2015      PT End of Session - 02/06/15 1427    Visit Number 4   Number of Visits 10   Date for PT Re-Evaluation 03/15/15   PT Start Time 1400   PT Stop Time 1444   PT Time Calculation (min) 44 min   Activity Tolerance Patient tolerated treatment well   Behavior During Therapy [facility] for tasks assessed/performed      Past Medical History  Diagnosis Date  . Diabetes mellitus without complication ([place])   . Thyroid disease   . Edema   . Elevated serum immunoglobulin free light chain level 11/29/2013  . Macrocytosis without anemia 11/29/2013  . Unspecified deficiency anemia 11/29/2013  . Osteoporosis     Past Surgical History  Procedure Laterality Date  . Eye surgery Left 03/2013    eye bleeds internally  . Eye surgery Right 03/2013  . Fracture surgery Left 2012    leg with metal fixation  . Appendectomy      There were no vitals filed for this visit.  Visit Diagnosis:  Right-sided low back pain without sciatica  Abnormality of gait  Weakness of both legs      Subjective Assessment - 02/06/15 1407    Subjective Pt reports she is feeling better, pt reports able to walk all the distance needed at her home without sitting breaks.    Currently in Pain? No/denies                         [facility] Adult PT Treatment/Exercise - 02/06/15 0001    Exercises   Exercises Knee/Hip;Lumbar   Lumbar Exercises: Aerobic   Stationary Bike Nu-step L1 x 52min  Seat#7, arms #10   Lumbar Exercises: Standing   Row Strengthening  25# 2x10   Other Standing Lumbar Exercises Tandemstance with LE x 2 with 10 sec   Other Standing Lumbar Exercises  Tandemwalk Lt & Rt 39feet x 3 each direction   Lumbar Exercises: Supine   Bridge 20 reps;2 seconds  limited in her ability lifting up but no discomfort   Knee/Hip Exercises: Standing   Hip Abduction Stengthening;Both;3 sets;10 reps  2# added   Hip Extension Stengthening;Both;3 sets;10 reps  2#added   Rebounder 3 directions x 1 min each with both UE support   Gait Training with 2#added 122feet (1round in gym) in 42sec   Knee/Hip Exercises: Seated   Long Arc Quad Both;3 sets;10 reps  3# added                  PT Short Term Goals - 02/06/15 1432    PT SHORT TERM GOAL #1   Title independent with initial HEP   Time 4   Period Weeks   Status On-going   PT SHORT TERM GOAL #2   Title Berg Balance score >/= 42/56 due to improved balance   Time 4   Period Weeks   Status On-going   PT SHORT TERM GOAL #3   Title lumbar pain with daily activities decreased >/= 25%   Time 4   Period Weeks   Status On-going   PT SHORT TERM GOAL #4   Title understand correct body mechanics to  decrease strain on lumbar   Baseline 4   Period Weeks   Status On-going   PT SHORT TERM GOAL #5   Title understand she is to walk with a SPC on the right to reduce chane of falling   Time 4   Period Weeks   Status Achieved           PT Long Term Goals - 01/18/15 1649    PT LONG TERM GOAL #1   Title indpendent with HEP and understands how to progress herself   Time 8   Period Weeks   Status New   PT LONG TERM GOAL #2   Title lumbar pain decreased >/= 50% due to her stronge and has decreased gait deficits.    Time 8   Period Weeks   Status New   PT LONG TERM GOAL #3   Title Berg balance >/= 48/56 due to improved bil. LE strength   Time 8   Period Weeks   Status New   PT LONG TERM GOAL #4   Title understand ways to avoid falls    Time 8   Period Weeks   Status New               Plan - 02/06/15 1427    Clinical Impression Statement Pt is a 80 y.o. female with low endurance  and unsteady gait, pt  able to perform strengthening exercises in standing. Pt will continue to benefit from skilled PT for strength flexibility to i prove safety with gait.    Clinical Impairments Affecting Rehab Potential Pt with low endurance and unsteady gait.    PT Frequency 2x / week   PT Duration 8 weeks   PT Treatment/Interventions Cryotherapy;Electrical Stimulation;Moist Heat;Therapeutic exercise;Therapeutic activities;Gait training;Stair training;Ultrasound;Neuromuscular re-education;Patient/family education;Manual techniques;Energy conservation   PT Next Visit Plan Balance exercises,  modalities as needed, bil. LE strength, gait training   PT Home Exercise Plan Continue with strengthening and balance activties ,   Consulted and Agree with Plan of Care Patient        Problem List Patient Active Problem List   Diagnosis Date Noted  . Elevated serum immunoglobulin free light chain level 11/29/2013  . Macrocytosis without anemia 11/29/2013  . Unspecified deficiency anemia 11/29/2013  . Edema     NAUMANN-HOUEGNIFIO,Jaeveon [place] PTA 02/06/2015, 3:36 PM  [facility] [address], [place], [place], [address] Phone: [phone]   Fax:  [phone]  Name: [name] Church MRN: [id number] Date of Birth: 1934/09/12

## 2015-02-06 NOTE — Patient Instructions (Signed)
HIP / KNEE: Flexion, Knee to Chest - Supine    Raise knee to chest. Hold for 20 second. Perform slowly. 3___ reps per set, 3___ sets per day, 5  days per week Hold your leg with both of your hands.Side-Stepping    Walk to left side with eyes open. Take even steps, leading with same foot. Make sure each foot lifts off the floor. Repeat in opposite direction. Repeat for 2 minutes per session. Do _2-3___ sessions per day. Hold on to kitchen counter Copyright  VHI. All rights reserved.

## 2015-02-08 ENCOUNTER — Ambulatory Visit: Payer: Medicare Other | Admitting: Physical Therapy

## 2015-02-08 ENCOUNTER — Encounter: Payer: Self-pay | Admitting: Physical Therapy

## 2015-02-08 DIAGNOSIS — M545 Low back pain, unspecified: Secondary | ICD-10-CM

## 2015-02-08 DIAGNOSIS — R29898 Other symptoms and signs involving the musculoskeletal system: Secondary | ICD-10-CM

## 2015-02-08 DIAGNOSIS — R269 Unspecified abnormalities of gait and mobility: Secondary | ICD-10-CM

## 2015-02-08 NOTE — Therapy (Signed)
[facility] [address] [place], [place], [address] Phone: [phone]   Fax:  [phone]  Physical Therapy Treatment  Patient Details  Name: [name] Church MRN: [id number] Date of Birth: 1934-11-06 Referring Provider: Dr. Jonathon Jordan  Encounter Date: 02/08/2015      PT End of Session - 02/08/15 1400    Visit Number 5   Number of Visits 10  Medicare   Date for PT Re-Evaluation 03/15/15   PT Start Time 1400   PT Stop Time 1440   PT Time Calculation (min) 40 min   Activity Tolerance Patient tolerated treatment well   Behavior During Therapy [facility] for tasks assessed/performed      Past Medical History  Diagnosis Date  . Diabetes mellitus without complication ([place])   . Thyroid disease   . Edema   . Elevated serum immunoglobulin free light chain level 11/29/2013  . Macrocytosis without anemia 11/29/2013  . Unspecified deficiency anemia 11/29/2013  . Osteoporosis     Past Surgical History  Procedure Laterality Date  . Eye surgery Left 03/2013    eye bleeds internally  . Eye surgery Right 03/2013  . Fracture surgery Left 2012    leg with metal fixation  . Appendectomy      There were no vitals filed for this visit.  Visit Diagnosis:  Right-sided low back pain without sciatica  Abnormality of gait  Weakness of both legs      Subjective Assessment - 02/08/15 1406    Subjective I can get up and do things without my back hurting.  I want to have confidence on stepping up and curb.    How long can you stand comfortably? 4 min   How long can you walk comfortably? 10 min   Patient Stated Goals improve balance and increase mobility   Currently in Pain? No/denies                         [facility] Adult PT Treatment/Exercise - 02/08/15 0001    Ambulation/Gait   Stairs Yes   Stair Management Technique Two rails;Step to pattern  leading with right on stairs 3 times   Lumbar Exercises:  Aerobic   Stationary Bike Nu-step L1 x 26min  Seat#7, arms #10   Knee/Hip Exercises: Standing   Hip Flexion Right;Left;3 sets;10 reps  2# per leg   Hip Abduction Stengthening;Both;3 sets;10 reps  2# added   Hip Extension Stengthening;Both;3 sets;10 reps  2#added   Rebounder 3 directions x 1 min each with both UE support   Gait Training with 2#added 125feet (1round in gym) x2                PT Education - 02/08/15 1438    Education provided Yes   Education Details body mechanics for sitting and lifting, and sleeping on side   Person(s) Educated Patient   Methods Explanation;Demonstration;Verbal cues;Handout   Comprehension Returned demonstration;Verbalized understanding          PT Short Term Goals - 02/08/15 1426    PT SHORT TERM GOAL #1   Title independent with initial HEP   Time 4   Period Weeks   Status Achieved   PT SHORT TERM GOAL #3   Title lumbar pain with daily activities decreased >/= 25%   Time 4   Period Weeks   Status Achieved   PT SHORT TERM GOAL #4   Title understand correct body mechanics to decrease  strain on lumbar   Time 4   Status Achieved   PT SHORT TERM GOAL #5   Title understand she is to walk with a SPC on the right to reduce chane of falling   Time 4   Period Weeks   Status Achieved           PT Long Term Goals - 01/18/15 1649    PT LONG TERM GOAL #1   Title indpendent with HEP and understands how to progress herself   Time 8   Period Weeks   Status New   PT LONG TERM GOAL #2   Title lumbar pain decreased >/= 50% due to her stronge and has decreased gait deficits.    Time 8   Period Weeks   Status New   PT LONG TERM GOAL #3   Title Berg balance >/= 48/56 due to improved bil. LE strength   Time 8   Period Weeks   Status New   PT LONG TERM GOAL #4   Title understand ways to avoid falls    Time 8   Period Weeks   Status New               Plan - 02/08/15 1439    Clinical Impression Statement Patient is a 80  year old female with low endurance and unsteady gait.  Patient has difficulty going up and down curbs. Patient goes down a step with right hip externally rotated due to weakness on left knee. Patient has learned body mechanics to reduce strain on lumbar.  Patient will benefit from physical therapy to  improve strength and balance.    Pt will benefit from skilled therapeutic intervention in order to improve on the following deficits Abnormal gait;Decreased activity tolerance;Decreased balance;Decreased mobility;Decreased strength;Impaired flexibility;Pain;Decreased endurance;Increased muscle spasms;Increased fascial restricitons;Difficulty walking;Decreased range of motion   Rehab Potential Good   Clinical Impairments Affecting Rehab Potential Pt with low endurance and unsteady gait.    PT Frequency 2x / week   PT Duration 8 weeks   PT Treatment/Interventions Cryotherapy;Electrical Stimulation;Moist Heat;Therapeutic exercise;Therapeutic activities;Gait training;Stair training;Ultrasound;Neuromuscular re-education;Patient/family education;Manual techniques;Energy conservation   PT Next Visit Plan Balance exercises,  modalities as needed, bil. LE strength, gait training; work on steps   [place] progress as needed   Consulted and Agree with Plan of Care Patient        Problem List Patient Active Problem List   Diagnosis Date Noted  . Elevated serum immunoglobulin free light chain level 11/29/2013  . Macrocytosis without anemia 11/29/2013  . Unspecified deficiency anemia 11/29/2013  . Edema     Cledith Kamiya,PT 02/08/2015, 2:44 PM  [place] [address] [place], [place], [address] Phone: [phone]   Fax:  [phone]  Name: [name] Church MRN: [id number] Date of Birth: 18-Jul-1934

## 2015-02-08 NOTE — Patient Instructions (Signed)
Posture - Sitting    Sit upright, head facing forward. Try using a roll to support lower back. Keep shoulders relaxed, and avoid rounded back. Keep hips level with knees. Avoid crossing legs for long periods.   Copyright  VHI. All rights reserved.  Sleeping on Side    Place pillow between knees. Use cervical support under neck and a roll around waist as needed.   Copyright  VHI. All rights reserved.   [facility] [address] [place], [place] [address] Phone # [phone] Fax [phone]

## 2015-02-13 ENCOUNTER — Encounter: Payer: Self-pay | Admitting: Physical Therapy

## 2015-02-13 ENCOUNTER — Ambulatory Visit: Payer: Medicare Other | Admitting: Physical Therapy

## 2015-02-13 DIAGNOSIS — M545 Low back pain, unspecified: Secondary | ICD-10-CM

## 2015-02-13 DIAGNOSIS — R29898 Other symptoms and signs involving the musculoskeletal system: Secondary | ICD-10-CM

## 2015-02-13 DIAGNOSIS — R269 Unspecified abnormalities of gait and mobility: Secondary | ICD-10-CM

## 2015-02-13 NOTE — Therapy (Signed)
[facility] [address] [place], [place], [address] Phone: [phone]   Fax:  [phone]  Physical Therapy Treatment  Patient Details  Name: [name] Church MRN: [id number] Date of Birth: 05-29-1934 Referring Provider: Dr. Jonathon Jordan  Encounter Date: 02/13/2015      PT End of Session - 02/13/15 1442    Visit Number 6   Number of Visits 10   Date for PT Re-Evaluation 03/15/15   PT Start Time [id number]   PT Stop Time 1439   PT Time Calculation (min) 37 min   Activity Tolerance Patient tolerated treatment well  Pt leightheaded needed to discontinue exercises and rest in sitting BP taken   Behavior During Therapy [facility] for tasks assessed/performed      Past Medical History  Diagnosis Date  . Diabetes mellitus without complication ([place])   . Thyroid disease   . Edema   . Elevated serum immunoglobulin free light chain level 11/29/2013  . Macrocytosis without anemia 11/29/2013  . Unspecified deficiency anemia 11/29/2013  . Osteoporosis     Past Surgical History  Procedure Laterality Date  . Eye surgery Left 03/2013    eye bleeds internally  . Eye surgery Right 03/2013  . Fracture surgery Left 2012    leg with metal fixation  . Appendectomy      There were no vitals filed for this visit.  Visit Diagnosis:  Right-sided low back pain without sciatica  Abnormality of gait  Weakness of both legs      Subjective Assessment - 02/13/15 1413    Subjective Pt has no complain of pain in low back, stepping up a curb is difficult.    How long can you sit comfortably? No difficulty   How long can you stand comfortably? 32min   How long can you walk comfortably? 10 min   Currently in Pain? No/denies                         [facility] Adult PT Treatment/Exercise - 02/13/15 0001    Ambulation/Gait   Stairs Yes   Stair Management Technique Two rails;Step to pattern  leading with right   Exercises    Exercises Knee/Hip;Lumbar   Lumbar Exercises: Aerobic   Stationary Bike Nu-step L1 x 65min  seat #7, arms # 10   Knee/Hip Exercises: Standing   Hip Flexion Right;Left;3 sets;10 reps   Hip Abduction Stengthening;Both;3 sets;10 reps  2# added    Hip Extension Stengthening;Both;3 sets;10 reps  2# added   Step Down Right;2 sets;Hand Hold: 2;Step Height: 2"  increased step height to 4 inches 2 x 10   Rebounder x 1 min side to side, needed to stop due to feeling lighheaded   BP taken 110/55, a few minutes later 120/60, pt feeling bett   Knee/Hip Exercises: Seated   Long Arc Quad Both;3 sets;10 reps  2# added                  PT Short Term Goals - 02/13/15 1444    PT SHORT TERM GOAL #1   Title independent with initial HEP   Time 4   Period Weeks   Status Achieved   PT SHORT TERM GOAL #2   Title Berg Balance score >/= 42/56 due to improved balance   Time 4   Period Weeks   Status On-going   PT SHORT TERM GOAL #3   Title lumbar pain with daily activities decreased >/=  25%   Time 4   Period Weeks   Status Achieved   PT SHORT TERM GOAL #4   Title understand correct body mechanics to decrease strain on lumbar   Time 4   Status Achieved   PT SHORT TERM GOAL #5   Title understand she is to walk with a SPC on the right to reduce chane of falling   Time 4   Period Weeks   Status Achieved           PT Long Term Goals - 02/13/15 1448    PT LONG TERM GOAL #1   Title indpendent with HEP and understands how to progress herself   Time 8   Period Weeks   Status On-going   PT LONG TERM GOAL #2   Title lumbar pain decreased >/= 50% due to her stronge and has decreased gait deficits.    Time 8   Period Weeks   Status On-going   PT LONG TERM GOAL #3   Title Berg balance >/= 48/56 due to improved bil. LE strength   Time 8   Period Weeks   Status On-going   PT LONG TERM GOAL #4   Title understand ways to avoid falls    Time 8   Period Weeks   Status On-going                Plan - 02/13/15 1443    Clinical Impression Statement Pateint is a 80 year old female with low endurance and usteady gait. Patient has difficulty going up and down curbs. Patient will benefit from skilled PT to improve strength and balance.    Pt will benefit from skilled therapeutic intervention in order to improve on the following deficits Abnormal gait;Decreased activity tolerance;Decreased balance;Decreased mobility;Decreased strength;Impaired flexibility;Pain;Decreased endurance;Increased muscle spasms;Increased fascial restricitons;Difficulty walking;Decreased range of motion   Rehab Potential Good   PT Frequency 2x / week   PT Duration 8 weeks   PT Treatment/Interventions Cryotherapy;Electrical Stimulation;Moist Heat;Therapeutic exercise;Therapeutic activities;Gait training;Stair training;Ultrasound;Neuromuscular re-education;Patient/family education;Manual techniques;Energy conservation   PT Next Visit Plan Balance exercises,  modalities as needed, bil. LE strength, gait training; work on steps   [place] progress as needed   Consulted and Agree with Plan of Care Patient        Problem List Patient Active Problem List   Diagnosis Date Noted  . Elevated serum immunoglobulin free light chain level 11/29/2013  . Macrocytosis without anemia 11/29/2013  . Unspecified deficiency anemia 11/29/2013  . Edema     NAUMANN-HOUEGNIFIO,Aayliah Rotenberry PTA 02/13/2015, 2:53 PM  [facility] [address], [place], [place], [address] Phone: [phone]   Fax:  [phone]  Name: [name] Church MRN: [id number] Date of Birth: March 20, 1934

## 2015-02-15 ENCOUNTER — Ambulatory Visit: Payer: Medicare Other | Admitting: Physical Therapy

## 2015-02-15 ENCOUNTER — Encounter: Payer: Self-pay | Admitting: Physical Therapy

## 2015-02-15 DIAGNOSIS — R269 Unspecified abnormalities of gait and mobility: Secondary | ICD-10-CM

## 2015-02-15 DIAGNOSIS — M545 Low back pain, unspecified: Secondary | ICD-10-CM

## 2015-02-15 DIAGNOSIS — R29898 Other symptoms and signs involving the musculoskeletal system: Secondary | ICD-10-CM

## 2015-02-15 NOTE — Therapy (Signed)
[facility] [address] [place], [place], [address] Phone: [phone]   Fax:  [phone]  Physical Therapy Treatment  Patient Details  Name: [name] Church MRN: [id number] Date of Birth: 08/26/1934 Referring Provider: Dr. Jonathon Jordan  Encounter Date: 02/15/2015      PT End of Session - 02/15/15 1405    Visit Number 7   Number of Visits 10   Date for PT Re-Evaluation 03/15/15   PT Start Time [id number]   PT Stop Time 1444   PT Time Calculation (min) 45 min   Activity Tolerance Patient tolerated treatment well   Behavior During Therapy [facility] for tasks assessed/performed      Past Medical History  Diagnosis Date  . Diabetes mellitus without complication ([place])   . Thyroid disease   . Edema   . Elevated serum immunoglobulin free light chain level 11/29/2013  . Macrocytosis without anemia 11/29/2013  . Unspecified deficiency anemia 11/29/2013  . Osteoporosis     Past Surgical History  Procedure Laterality Date  . Eye surgery Left 03/2013    eye bleeds internally  . Eye surgery Right 03/2013  . Fracture surgery Left 2012    leg with metal fixation  . Appendectomy      There were no vitals filed for this visit.  Visit Diagnosis:  Right-sided low back pain without sciatica  Abnormality of gait  Weakness of both legs      Subjective Assessment - 02/15/15 1404    Subjective Pt feels better today, no complain of low back pain, but stepping up a curb is difficult.    How long can you sit comfortably? No difficulty   How long can you stand comfortably? 54min   How long can you walk comfortably? 10 min   Patient Stated Goals improve balance and increase mobility   Currently in Pain? No/denies   Multiple Pain Sites No                         [facility] Adult PT Treatment/Exercise - 02/15/15 0001    Ambulation/Gait   Stairs Yes   Stair Management Technique Two rails;Step to pattern  leading  with Rt LE, able to progress toward step thru patter   Number of Stairs 16   Height of Stairs 6   Exercises   Exercises Knee/Hip;Lumbar   Lumbar Exercises: Aerobic   Stationary Bike Nu-step L1 x 57min  seat#7, arms #10   Lumbar Exercises: Standing   Row Strengthening  3x10   Knee/Hip Exercises: Standing   Hip Flexion Right;Left;3 sets;10 reps  with 2#   Hip Abduction Stengthening;Both;3 sets;10 reps  2#added   Hip Extension Stengthening;Both;3 sets;10 reps  2# added   Step Down Right;2 sets;Hand Hold: 2;Step Height: 4"  3 x 5 each leg   Gait Training with 2#added 155feet (4round in gym) in 50min 30sec   Knee/Hip Exercises: Seated   Long Arc Quad Both;3 sets;10 reps  3# added                  PT Short Term Goals - 02/13/15 1444    PT SHORT TERM GOAL #1   Title independent with initial HEP   Time 4   Period Weeks   Status Achieved   PT SHORT TERM GOAL #2   Title Berg Balance score >/= 42/56 due to improved balance   Time 4   Period Weeks   Status On-going  PT SHORT TERM GOAL #3   Title lumbar pain with daily activities decreased >/= 25%   Time 4   Period Weeks   Status Achieved   PT SHORT TERM GOAL #4   Title understand correct body mechanics to decrease strain on lumbar   Time 4   Status Achieved   PT SHORT TERM GOAL #5   Title understand she is to walk with a SPC on the right to reduce chane of falling   Time 4   Period Weeks   Status Achieved           PT Long Term Goals - 02/13/15 1448    PT LONG TERM GOAL #1   Title indpendent with HEP and understands how to progress herself   Time 8   Period Weeks   Status On-going   PT LONG TERM GOAL #2   Title lumbar pain decreased >/= 50% due to her stronge and has decreased gait deficits.    Time 8   Period Weeks   Status On-going   PT LONG TERM GOAL #3   Title Berg balance >/= 48/56 due to improved bil. LE strength   Time 8   Period Weeks   Status On-going   PT LONG TERM GOAL #4   Title  understand ways to avoid falls    Time 8   Period Weeks   Status On-going               Plan - 02/15/15 1406    Clinical Impression Statement Patient is a 80 year old female with low endurance and unsteady gait. Pt with difficulties going up and down curbs. Patient will benefit from skilled PT to improve strength and balance.    Pt will benefit from skilled therapeutic intervention in order to improve on the following deficits Abnormal gait;Decreased activity tolerance;Decreased balance;Decreased mobility;Decreased strength;Impaired flexibility;Pain;Decreased endurance;Increased muscle spasms;Increased fascial restricitons;Difficulty walking;Decreased range of motion   Rehab Potential Good   Clinical Impairments Affecting Rehab Potential Pt with low endurance and unsteady gait.    PT Frequency 2x / week   PT Duration 8 weeks   PT Treatment/Interventions Cryotherapy;Electrical Stimulation;Moist Heat;Therapeutic exercise;Therapeutic activities;Gait training;Stair training;Ultrasound;Neuromuscular re-education;Patient/family education;Manual techniques;Energy conservation   PT Next Visit Plan Balance exercises,  modalities as needed, bil. LE strength, gait training; work on steps   [place] progress as needed   Consulted and Agree with Plan of Care Patient        Problem List Patient Active Problem List   Diagnosis Date Noted  . Elevated serum immunoglobulin free light chain level 11/29/2013  . Macrocytosis without anemia 11/29/2013  . Unspecified deficiency anemia 11/29/2013  . Edema     NAUMANN-HOUEGNIFIO,Alyssamae Klinck PTA 02/15/2015, 4:57 PM  [facility] [address] [place], [place], [address] Phone: [phone]   Fax:  [phone]  Name: [name] Church MRN: [id number] Date of Birth: 1934-10-05

## 2015-02-20 ENCOUNTER — Encounter: Payer: Self-pay | Admitting: Physical Therapy

## 2015-02-20 ENCOUNTER — Ambulatory Visit: Payer: Medicare Other | Admitting: Physical Therapy

## 2015-02-20 DIAGNOSIS — M545 Low back pain, unspecified: Secondary | ICD-10-CM

## 2015-02-20 DIAGNOSIS — R269 Unspecified abnormalities of gait and mobility: Secondary | ICD-10-CM

## 2015-02-20 DIAGNOSIS — R29898 Other symptoms and signs involving the musculoskeletal system: Secondary | ICD-10-CM

## 2015-02-20 NOTE — Therapy (Signed)
[facility] [address] [place], [place], [address] Phone: [phone]   Fax:  [phone]  Physical Therapy Treatment  Patient Details  Name: [name] Church MRN: [id number] Date of Birth: [birth year] Referring Provider: Dr. Jonathon Jordan  Encounter Date: 02/20/2015      PT End of Session - 02/20/15 1415    Visit Number 8   Number of Visits 10  Medicare   Date for PT Re-Evaluation 03/15/15   PT Start Time 1400   PT Stop Time 1440   PT Time Calculation (min) 40 min   Activity Tolerance Patient tolerated treatment well   Behavior During Therapy [facility] for tasks assessed/performed      Past Medical History  Diagnosis Date  . Diabetes mellitus without complication ([place])   . Thyroid disease   . Edema   . Elevated serum immunoglobulin free light chain level 11/29/2013  . Macrocytosis without anemia 11/29/2013  . Unspecified deficiency anemia 11/29/2013  . Osteoporosis     Past Surgical History  Procedure Laterality Date  . Eye surgery Left 03/2013    eye bleeds internally  . Eye surgery Right 03/2013  . Fracture surgery Left 2012    leg with metal fixation  . Appendectomy      There were no vitals filed for this visit.  Visit Diagnosis:  Right-sided low back pain without sciatica  Abnormality of gait  Weakness of both legs      Subjective Assessment - 02/20/15 1417    Subjective I feel better.  I have stood making cookies and was able to with no back pain.    How long can you sit comfortably? No difficulty   How long can you stand comfortably? 39min   How long can you walk comfortably? 10 min   Patient Stated Goals improve balance and increase mobility   Currently in Pain? No/denies                         [facility] Adult PT Treatment/Exercise - 02/20/15 0001    Ambulation/Gait   Stairs Yes   Stair Management Technique Two rails;Step to pattern  leading with Rt LE, able to  progress toward step thru patter   Number of Stairs 16   Height of Stairs 6   Lumbar Exercises: Aerobic   Stationary Bike Nu-step L1 x 62min  seat#7, arms #10   Lumbar Exercises: Standing   Row Strengthening  25# 2x10   Other Standing Lumbar Exercises turn in a circle each way 6 times 4-5 sec. each   Other Standing Lumbar Exercises stand on one foot without holding on 2 sec bil.    Knee/Hip Exercises: Standing   Hip Abduction Stengthening;Both;3 sets;10 reps  2#added   Hip Extension Stengthening;Both;3 sets;10 reps  2# added   Gait Training with 2#added 121feet (4round in gym) in 40min 27sec   Knee/Hip Exercises: Seated   Long Arc Quad Both;3 sets;10 reps  3# added                PT Education - 02/20/15 1431    Education provided No          PT Short Term Goals - 02/13/15 1444    PT SHORT TERM GOAL #1   Title independent with initial HEP   Time 4   Period Weeks   Status Achieved   PT SHORT TERM GOAL #2   Title Berg Balance score >/= 42/56 due  to improved balance   Time 4   Period Weeks   Status On-going   PT SHORT TERM GOAL #3   Title lumbar pain with daily activities decreased >/= 25%   Time 4   Period Weeks   Status Achieved   PT SHORT TERM GOAL #4   Title understand correct body mechanics to decrease strain on lumbar   Time 4   Status Achieved   PT SHORT TERM GOAL #5   Title understand she is to walk with a SPC on the right to reduce chane of falling   Time 4   Period Weeks   Status Achieved           PT Long Term Goals - 02/20/15 1430    PT LONG TERM GOAL #1   Title indpendent with HEP and understands how to progress herself   Time 8   Period Weeks   Status On-going   PT LONG TERM GOAL #2   Title lumbar pain decreased >/= 50% due to her stronge and has decreased gait deficits.    Time 8   Period Weeks   Status Achieved   PT LONG TERM GOAL #3   Title Berg balance >/= 48/56 due to improved bil. LE strength   Time 8   Period Weeks    Status On-going   PT LONG TERM GOAL #4   Title understand ways to avoid falls    Time 8   Period Weeks   Status Achieved               Plan - 02/20/15 1442    Clinical Impression Statement Patient is a 80 year old female with low endurance and unstready gait.  Patient is able to walk around the gym in 2 min and 27 sec for 1000 feet which is faster than last time. Patient is only able to stand on one foot for 2 sec.  Patient is going up and down steps with reciprocal steps with 2 rails slowly in physical therapy.  Patient has me LTG # 2 and 4.  Patient reports her ack pain is 50% better.    Pt will benefit from skilled therapeutic intervention in order to improve on the following deficits Abnormal gait;Decreased activity tolerance;Decreased balance;Decreased mobility;Decreased strength;Impaired flexibility;Pain;Decreased endurance;Increased muscle spasms;Increased fascial restricitons;Difficulty walking;Decreased range of motion   Rehab Potential Good   Clinical Impairments Affecting Rehab Potential Pt with low endurance and unsteady gait.    PT Frequency 2x / week   PT Duration 8 weeks   PT Treatment/Interventions Cryotherapy;Electrical Stimulation;Moist Heat;Therapeutic exercise;Therapeutic activities;Gait training;Stair training;Ultrasound;Neuromuscular re-education;Patient/family education;Manual techniques;Energy conservation   PT Next Visit Plan berg balance test, work on exercises she can do at the gym   PT [place] progress as needed   Recommended Other Services None   Consulted and Agree with Plan of Care Patient        Problem List Patient Active Problem List   Diagnosis Date Noted  . Elevated serum immunoglobulin free light chain level 11/29/2013  . Macrocytosis without anemia 11/29/2013  . Unspecified deficiency anemia 11/29/2013  . Edema     Earlie Counts, PT 02/20/2015 2:47 PM   [facility] [address], [place], [place], [address] Phone: [phone]   Fax:  [phone]  Name: SOPHELIA LEGETTE MRN: [id number] Date of Birth: Apr 05, 1934

## 2015-02-22 ENCOUNTER — Ambulatory Visit: Payer: Medicare Other | Admitting: Physical Therapy

## 2015-02-22 DIAGNOSIS — M545 Low back pain: Secondary | ICD-10-CM | POA: Diagnosis not present

## 2015-02-22 DIAGNOSIS — R29898 Other symptoms and signs involving the musculoskeletal system: Secondary | ICD-10-CM

## 2015-02-22 DIAGNOSIS — R269 Unspecified abnormalities of gait and mobility: Secondary | ICD-10-CM

## 2015-02-22 NOTE — Therapy (Signed)
[facility] [address] [place], [place], [address] Phone: [phone]   Fax:  [phone]  Physical Therapy Treatment  Patient Details  Name: [name] Church MRN: [id number] Date of Birth: 09-Dec-1934 Referring Provider: Dr. Jonathon Church  Encounter Date: 02/22/2015      PT End of Session - 02/22/15 1527    Visit Number 9   Number of Visits 10  medicare   Date for PT Re-Evaluation 03/15/15   PT Start Time [id number]   PT Stop Time 1528   PT Time Calculation (min) 43 min   Activity Tolerance Patient tolerated treatment well   Behavior During Therapy [facility] for tasks assessed/performed      Past Medical History  Diagnosis Date  . Diabetes mellitus without complication ([place])   . Thyroid disease   . Edema   . Elevated serum immunoglobulin free light chain level 11/29/2013  . Macrocytosis without anemia 11/29/2013  . Unspecified deficiency anemia 11/29/2013  . Osteoporosis     Past Surgical History  Procedure Laterality Date  . Eye surgery Left 03/2013    eye bleeds internally  . Eye surgery Right 03/2013  . Fracture surgery Left 2012    leg with metal fixation  . Appendectomy      There were no vitals filed for this visit.  Visit Diagnosis:  Abnormality of gait  Weakness of both legs      Subjective Assessment - 02/22/15 1455    Subjective my balance is better. I [facility]   How long can you sit comfortably? No difficulty   How long can you stand comfortably? 29min   How long can you walk comfortably? 10 min   Patient Stated Goals improve balance and increase mobility   Currently in Pain? No/denies            [facility] PT Assessment - 02/22/15 0001    Observation/Other Assessments   Focus on Therapeutic Outcomes (FOTO)  39% limitation CJ   Berg Balance Test   Sit to Stand Able to stand without using hands and stabilize independently   Standing Unsupported Able to stand  safely 2 minutes   Sitting with Back Unsupported but Feet Supported on Floor or Stool Able to sit safely and securely 2 minutes   Stand to Sit Sits safely with minimal use of hands   Transfers Able to transfer safely, minor use of hands   Standing Unsupported with Eyes Closed Able to stand 10 seconds safely   Standing Ubsupported with Feet Together Able to place feet together independently and stand 1 minute safely   From Standing, Reach Forward with Outstretched Arm Can reach forward >12 cm safely (5")   From Standing Position, Pick up Object from Floor Unable to pick up shoe, but reaches 2-5 cm (1-2") from shoe and balances independently   From Standing Position, Turn to Look Behind Over each Shoulder Looks behind from both sides and weight shifts well   Turn 360 Degrees Able to turn 360 degrees safely in 4 seconds or less   Standing Unsupported, Alternately Place Feet on Step/Stool Able to stand independently and complete 8 steps >20 seconds   Standing Unsupported, One Foot in Front Able to take small step independently and hold 30 seconds   Standing on One Leg Tries to lift leg/unable to hold 3 seconds but remains standing independently   Total Score 47   Berg comment: 50% chance of falling  [facility] Adult PT Treatment/Exercise - 02/22/15 0001    Ambulation/Gait   Stairs Yes   Stair Management Technique Two rails;Step to pattern  leading with Rt LE, able to progress toward step thru patter   Number of Stairs 16   Height of Stairs 6   Lumbar Exercises: Aerobic   Stationary Bike Nu-step L1 x 36min  seat#7, arms #10   Elliptical 2 min level 1 with assistance to put feet inot the footplate   Lumbar Exercises: Machines for Strengthening   Leg Press 30# St.#7 2 legs 30x, needed help to put feet on plate                PT Education - 02/22/15 1526    Education provided Yes   Education Details educated patient on how to use the leg press at the y  and to try the chair aerobics in the [employer] sneaker program   Person(s) Educated Patient   Methods Explanation;Demonstration   Comprehension Verbalized understanding;Returned demonstration          PT Short Term Goals - 02/13/15 1444    PT SHORT TERM GOAL #1   Title independent with initial HEP   Time 4   Period Weeks   Status Achieved   PT SHORT TERM GOAL #2   Title Berg Balance score >/= 42/56 due to improved balance   Time 4   Period Weeks   Status On-going   PT SHORT TERM GOAL #3   Title lumbar pain with daily activities decreased >/= 25%   Time 4   Period Weeks   Status Achieved   PT SHORT TERM GOAL #4   Title understand correct body mechanics to decrease strain on lumbar   Time 4   Status Achieved   PT SHORT TERM GOAL #5   Title understand she is to walk with a SPC on the right to reduce chane of falling   Time 4   Period Weeks   Status Achieved           PT Long Term Goals - 02/20/15 1430    PT LONG TERM GOAL #1   Title indpendent with HEP and understands how to progress herself   Time 8   Period Weeks   Status On-going   PT LONG TERM GOAL #2   Title lumbar pain decreased >/= 50% due to her stronge and has decreased gait deficits.    Time 8   Period Weeks   Status Achieved   PT LONG TERM GOAL #3   Title Berg balance >/= 48/56 due to improved bil. LE strength   Time 8   Period Weeks   Status On-going   PT LONG TERM GOAL #4   Title understand ways to avoid falls    Time 8   Period Weeks   Status Achieved               Plan - 02/22/15 1528    Clinical Impression Statement patient is a 80  year old female with low endurance and unsteady gait.  Patient [name] Church Balance has improved from 36/56 to 47/56.  Patient FOTO score is 39% limitation.  Patient able to go up down stairs with increased pace.  Patient will benefit from physical therapy to increase balance and educated on how to work out at the gym safely.    Pt will benefit from skilled  therapeutic intervention in order to improve on the following deficits Abnormal gait;Decreased activity tolerance;Decreased balance;Decreased mobility;Decreased strength;Impaired  flexibility;Pain;Decreased endurance;Increased muscle spasms;Increased fascial restricitons;Difficulty walking;Decreased range of motion   Rehab Potential Good   Clinical Impairments Affecting Rehab Potential Pt with low endurance and unsteady gait.    PT Frequency 2x / week   PT Duration 8 weeks   PT Treatment/Interventions Cryotherapy;Electrical Stimulation;Moist Heat;Therapeutic exercise;Therapeutic activities;Gait training;Stair training;Ultrasound;Neuromuscular re-education;Patient/family education;Manual techniques;Energy conservation   PT Next Visit Plan  work on exercises she can do at the gym, G-code, progress note or d/c; FOTO was done at visit #9   PT [place] work on gym program at y   [employer] and Agree with Plan of Care Patient        Problem List Patient Active Problem List   Diagnosis Date Noted  . Elevated serum immunoglobulin free light chain level 11/29/2013  . Macrocytosis without anemia 11/29/2013  . Unspecified deficiency anemia 11/29/2013  . Edema     Earlie Counts, PT 02/22/2015 3:34 PM   [facility] [address], [place], [place], [address] Phone: [phone]   Fax:  [phone]  Name: [name] Church MRN: [id number] Date of Birth: 06-Nov-1934

## 2015-02-27 ENCOUNTER — Ambulatory Visit: Payer: Medicare Other

## 2015-02-27 DIAGNOSIS — M545 Low back pain, unspecified: Secondary | ICD-10-CM

## 2015-02-27 DIAGNOSIS — R29898 Other symptoms and signs involving the musculoskeletal system: Secondary | ICD-10-CM

## 2015-02-27 DIAGNOSIS — R269 Unspecified abnormalities of gait and mobility: Secondary | ICD-10-CM

## 2015-02-27 NOTE — Therapy (Addendum)
[facility] [address] [place], [place], [address] Phone: [phone]   Fax:  [phone]  Physical Therapy Treatment  Patient Details  Name: ARVIS MIGUEZ MRN: [id number] Date of Birth: 1934/04/11 Referring Provider: Dr. Jonathon Jordan  Encounter Date: 02/27/2015      PT End of Session - 02/27/15 1436    Visit Number 10   Number of Visits 20   Date for PT Re-Evaluation 03/15/15   PT Start Time 1400   PT Stop Time 1438   PT Time Calculation (min) 38 min   Activity Tolerance Patient tolerated treatment well   Behavior During Therapy [facility] for tasks assessed/performed      Past Medical History  Diagnosis Date  . Diabetes mellitus without complication ([place])   . Thyroid disease   . Edema   . Elevated serum immunoglobulin free light chain level 11/29/2013  . Macrocytosis without anemia 11/29/2013  . Unspecified deficiency anemia 11/29/2013  . Osteoporosis     Past Surgical History  Procedure Laterality Date  . Eye surgery Left 03/2013    eye bleeds internally  . Eye surgery Right 03/2013  . Fracture surgery Left 2012    leg with metal fixation  . Appendectomy      There were no vitals filed for this visit.  Visit Diagnosis:  Abnormality of gait  Weakness of both legs  Right-sided low back pain without sciatica      Subjective Assessment - 02/27/15 1405    Subjective My balance is improving.  Pt reports 60% overall improvement in balance since the start of care.  Pt wants to D/C this week to HEP/gym exercises.   Currently in Pain? No/denies                                   PT Short Term Goals - 02/13/15 1444    PT SHORT TERM GOAL #1   Title independent with initial HEP   Time 4   Period Weeks   Status Achieved   PT SHORT TERM GOAL #2   Title Berg Balance score >/= 42/56 due to improved balance   Time 4   Period Weeks   Status On-going   PT SHORT TERM GOAL #3    Title lumbar pain with daily activities decreased >/= 25%   Time 4   Period Weeks   Status Achieved   PT SHORT TERM GOAL #4   Title understand correct body mechanics to decrease strain on lumbar   Time 4   Status Achieved   PT SHORT TERM GOAL #5   Title understand she is to walk with a SPC on the right to reduce chane of falling   Time 4   Period Weeks   Status Achieved           PT Long Term Goals - 02/27/15 1414    PT LONG TERM GOAL #1   Title indpendent with HEP and understands how to progress herself   Time --   Period --   Status Achieved   PT LONG TERM GOAL #2   Title lumbar pain decreased >/= 50% due to her stronge and has decreased gait deficits.    Status Achieved   PT LONG TERM GOAL #3   Title Berg balance >/= 48/56 due to improved bil. LE strength   Status Achieved   PT LONG TERM GOAL #4  Title understand ways to avoid falls    Status Achieved               Plan - Mar 08, 2015 1410    Clinical Impression Statement Pt is a 80 y.o. female with low endurance and unsteady gait.  Berg Balance Test improved from 36/56 to 47/56.  FOTO score is 39% limitation.  PT is able to up and down steps with increased pace.  Pt reports 60% overall improvement in balance and confidence with gait.  Pt will benefit from 1 more PT session to finalize HEP/gym program for cotntinued gains after D/C.     Pt will benefit from skilled therapeutic intervention in order to improve on the following deficits Abnormal gait;Decreased activity tolerance;Decreased balance;Decreased mobility;Decreased strength;Impaired flexibility;Pain;Decreased endurance;Increased muscle spasms;Increased fascial restricitons;Difficulty walking;Decreased range of motion   PT Frequency 2x / week   PT Treatment/Interventions Cryotherapy;Electrical Stimulation;Moist Heat;Therapeutic exercise;Therapeutic activities;Gait training;Stair training;Ultrasound;Neuromuscular re-education;Patient/family education;Manual  techniques;Energy conservation   PT Next Visit Plan 1 more session.  D/C to HEP next session.            G-Codes - 03-08-2015 1409    Functional Assessment Tool Used 39% limitation   Functional Limitation Other PT primary   Other PT Primary Current Status (S4739) --   Other PT Primary Goal Status (P8441) At least 20 percent but less than 40 percent impaired, limited or restricted   Other PT Primary Discharge Status (N1278) At least 20 percent but less than 40 percent impaired, limited or restricted      Problem List Patient Active Problem List   Diagnosis Date Noted  . Elevated serum immunoglobulin free light chain level 11/29/2013  . Macrocytosis without anemia 11/29/2013  . Unspecified deficiency anemia 11/29/2013  . Edema   Physical Therapy Progress Note  Dates of Reporting Period: 01/18/15 to 2015/03/08  Objective Reports of Subjective Statement: 60% improved confidence with gait and balance  Objective Measurements: See above  Goal Update: See above.   Plan: 1 more session probable to finalize HEP and gym exercises for continued gains after discharge  Reason Skilled Services are Required: improve independence with gym exercise and HEP to improve balance and endurance.     Enzio Buchler, PT 02/28/2015, 10:46 AM PHYSICAL THERAPY DISCHARGE SUMMARY  Visits from Start of Care: 10  Current functional level related to goals / functional outcomes: See above for current status.  Pt called to cancel her final PT session and will be discharged to HEP, gym program.     Remaining deficits: See above.     Education / Equipment: HEP, gym exercises Plan: Patient agrees to discharge.  Patient goals were met. Patient is being discharged due to meeting the stated rehab goals.  ?????    Sigurd Sos, PT 02/28/2015 10:46 AM [facility] [address] [place], [place], [address] Phone: [phone]   Fax:   [phone]  Name: SARAHY CREEDON MRN: [id number] Date of Birth: 1934-06-25

## 2015-03-01 ENCOUNTER — Ambulatory Visit: Payer: Self-pay

## 2015-03-20 ENCOUNTER — Encounter: Payer: Self-pay | Admitting: Podiatry

## 2015-03-20 ENCOUNTER — Ambulatory Visit (INDEPENDENT_AMBULATORY_CARE_PROVIDER_SITE_OTHER): Payer: Medicare Other | Admitting: Podiatry

## 2015-03-20 DIAGNOSIS — M79676 Pain in unspecified toe(s): Secondary | ICD-10-CM

## 2015-03-20 DIAGNOSIS — B351 Tinea unguium: Secondary | ICD-10-CM

## 2015-03-20 NOTE — Patient Instructions (Signed)
Diabetes and Foot Care Diabetes may cause you to have problems because of poor blood supply (circulation) to your feet and legs. This may cause the skin on your feet to become thinner, break easier, and heal more slowly. Your skin may become dry, and the skin may peel and crack. You may also have nerve damage in your legs and feet causing decreased feeling in them. You may not notice minor injuries to your feet that could lead to infections or more serious problems. Taking care of your feet is one of the most important things you can do for yourself.  HOME CARE INSTRUCTIONS  Wear shoes at all times, even in the house. Do not go barefoot. Bare feet are easily injured.  Check your feet daily for blisters, cuts, and redness. If you cannot see the bottom of your feet, use a mirror or ask someone for help.  Wash your feet with warm water (do not use hot water) and mild soap. Then pat your feet and the areas between your toes until they are completely dry. Do not soak your feet as this can dry your skin.  Apply a moisturizing lotion or petroleum jelly (that does not contain alcohol and is unscented) to the skin on your feet and to dry, brittle toenails. Do not apply lotion between your toes.  Trim your toenails straight across. Do not dig under them or around the cuticle. File the edges of your nails with an emery board or nail file.  Do not cut corns or calluses or try to remove them with medicine.  Wear clean socks or stockings every day. Make sure they are not too tight. Do not wear knee-high stockings since they may decrease blood flow to your legs.  Wear shoes that fit properly and have enough cushioning. To break in new shoes, wear them for just a few hours a day. This prevents you from injuring your feet. Always look in your shoes before you put them on to be sure there are no objects inside.  Do not cross your legs. This may decrease the blood flow to your feet.  If you find a minor scrape,  cut, or break in the skin on your feet, keep it and the skin around it clean and dry. These areas may be cleansed with mild soap and water. Do not cleanse the area with peroxide, alcohol, or iodine.  When you remove an adhesive bandage, be sure not to damage the skin around it.  If you have a wound, look at it several times a day to make sure it is healing.  Do not use heating pads or hot water bottles. They may burn your skin. If you have lost feeling in your feet or legs, you may not know it is happening until it is too late.  Make sure your health care provider performs a complete foot exam at least annually or more often if you have foot problems. Report any cuts, sores, or bruises to your health care provider immediately. SEEK MEDICAL CARE IF:   You have an injury that is not healing.  You have cuts or breaks in the skin.  You have an ingrown nail.  You notice redness on your legs or feet.  You feel burning or tingling in your legs or feet.  You have pain or cramps in your legs and feet.  Your legs or feet are numb.  Your feet always feel cold. SEEK IMMEDIATE MEDICAL CARE IF:   There is increasing redness,   swelling, or pain in or around a wound.  There is a red line that goes up your leg.  Pus is coming from a wound.  You develop a fever or as directed by your health care provider.  You notice a bad smell coming from an ulcer or wound.   This information is not intended to replace advice given to you by your health care provider. Make sure you discuss any questions you have with your health care provider.   Document Released: 02/15/2000 Document Revised: 10/20/2012 Document Reviewed: 07/27/2012 Elsevier Interactive Patient Education 2016 [employer]  

## 2015-03-20 NOTE — Progress Notes (Signed)
Patient ID: [name] Church, female   DOB: 1934/08/10, 82 y.o.   MRN: [id number]  Subjective: This patient presents for a scheduled visit complaining of thickened and elongated toenails which and cough walking wearing shoes and requests toenail debridement.  Objective: Orientated 3 No open skin lesions bilaterally The toenails are brittle, hypertrophic, deformed, incurvated and tender to direct palpation 6-10  Assessment: Symptomatic onychomycoses 6-10 Diabetic  Plan: Debridement toenails 6-10 mechanically and electronically without any bleeding  Reappoint 3 months

## 2015-06-19 ENCOUNTER — Ambulatory Visit (INDEPENDENT_AMBULATORY_CARE_PROVIDER_SITE_OTHER): Payer: Medicare Other | Admitting: Podiatry

## 2015-06-19 ENCOUNTER — Encounter: Payer: Self-pay | Admitting: Podiatry

## 2015-06-19 DIAGNOSIS — B351 Tinea unguium: Secondary | ICD-10-CM | POA: Diagnosis not present

## 2015-06-19 DIAGNOSIS — M79676 Pain in unspecified toe(s): Secondary | ICD-10-CM

## 2015-06-19 NOTE — Progress Notes (Signed)
Patient ID: [name] Church, female   DOB: Jun 06, 1934, 81 y.o.   MRN: [id number]  Subjective: This patient presents for a scheduled visit complaining of painful toenails and requests toenail debridement  Objective: Orientated 3 No open skin lesions bilaterally The toenails are elongated, brittle, discolored, hypertrophic and tender to direct palpation 6-10  Assessment: Symptomatic onychomycoses 6-10  Plan: Debridement toenails 10 mechanically and electrically without any bleeding  Reappoint 3 months

## 2015-06-19 NOTE — Patient Instructions (Signed)
Diabetes and Foot Care Diabetes may cause you to have problems because of poor blood supply (circulation) to your feet and legs. This may cause the skin on your feet to become thinner, break easier, and heal more slowly. Your skin may become dry, and the skin may peel and crack. You may also have nerve damage in your legs and feet causing decreased feeling in them. You may not notice minor injuries to your feet that could lead to infections or more serious problems. Taking care of your feet is one of the most important things you can do for yourself.  HOME CARE INSTRUCTIONS  Wear shoes at all times, even in the house. Do not go barefoot. Bare feet are easily injured.  Check your feet daily for blisters, cuts, and redness. If you cannot see the bottom of your feet, use a mirror or ask someone for help.  Wash your feet with warm water (do not use hot water) and mild soap. Then pat your feet and the areas between your toes until they are completely dry. Do not soak your feet as this can dry your skin.  Apply a moisturizing lotion or petroleum jelly (that does not contain alcohol and is unscented) to the skin on your feet and to dry, brittle toenails. Do not apply lotion between your toes.  Trim your toenails straight across. Do not dig under them or around the cuticle. File the edges of your nails with an emery board or nail file.  Do not cut corns or calluses or try to remove them with medicine.  Wear clean socks or stockings every day. Make sure they are not too tight. Do not wear knee-high stockings since they may decrease blood flow to your legs.  Wear shoes that fit properly and have enough cushioning. To break in new shoes, wear them for just a few hours a day. This prevents you from injuring your feet. Always look in your shoes before you put them on to be sure there are no objects inside.  Do not cross your legs. This may decrease the blood flow to your feet.  If you find a minor scrape,  cut, or break in the skin on your feet, keep it and the skin around it clean and dry. These areas may be cleansed with mild soap and water. Do not cleanse the area with peroxide, alcohol, or iodine.  When you remove an adhesive bandage, be sure not to damage the skin around it.  If you have a wound, look at it several times a day to make sure it is healing.  Do not use heating pads or hot water bottles. They may burn your skin. If you have lost feeling in your feet or legs, you may not know it is happening until it is too late.  Make sure your health care provider performs a complete foot exam at least annually or more often if you have foot problems. Report any cuts, sores, or bruises to your health care provider immediately. SEEK MEDICAL CARE IF:   You have an injury that is not healing.  You have cuts or breaks in the skin.  You have an ingrown nail.  You notice redness on your legs or feet.  You feel burning or tingling in your legs or feet.  You have pain or cramps in your legs and feet.  Your legs or feet are numb.  Your feet always feel cold. SEEK IMMEDIATE MEDICAL CARE IF:   There is increasing redness,   swelling, or pain in or around a wound.  There is a red line that goes up your leg.  Pus is coming from a wound.  You develop a fever or as directed by your health care provider.  You notice a bad smell coming from an ulcer or wound.   This information is not intended to replace advice given to you by your health care provider. Make sure you discuss any questions you have with your health care provider.   Document Released: 02/15/2000 Document Revised: 10/20/2012 Document Reviewed: 07/27/2012 Elsevier Interactive Patient Education 2016 [employer]  

## 2015-09-18 ENCOUNTER — Encounter: Payer: Self-pay | Admitting: Podiatry

## 2015-09-18 ENCOUNTER — Ambulatory Visit (INDEPENDENT_AMBULATORY_CARE_PROVIDER_SITE_OTHER): Payer: Medicare Other | Admitting: Podiatry

## 2015-09-18 DIAGNOSIS — B351 Tinea unguium: Secondary | ICD-10-CM

## 2015-09-18 DIAGNOSIS — M79676 Pain in unspecified toe(s): Secondary | ICD-10-CM

## 2015-09-18 NOTE — Patient Instructions (Signed)
Diabetes and Foot Care Diabetes may cause you to have problems because of poor blood supply (circulation) to your feet and legs. This may cause the skin on your feet to become thinner, break easier, and heal more slowly. Your skin may become dry, and the skin may peel and crack. You may also have nerve damage in your legs and feet causing decreased feeling in them. You may not notice minor injuries to your feet that could lead to infections or more serious problems. Taking care of your feet is one of the most important things you can do for yourself.  HOME CARE INSTRUCTIONS  Wear shoes at all times, even in the house. Do not go barefoot. Bare feet are easily injured.  Check your feet daily for blisters, cuts, and redness. If you cannot see the bottom of your feet, use a mirror or ask someone for help.  Wash your feet with warm water (do not use hot water) and mild soap. Then pat your feet and the areas between your toes until they are completely dry. Do not soak your feet as this can dry your skin.  Apply a moisturizing lotion or petroleum jelly (that does not contain alcohol and is unscented) to the skin on your feet and to dry, brittle toenails. Do not apply lotion between your toes.  Trim your toenails straight across. Do not dig under them or around the cuticle. File the edges of your nails with an emery board or nail file.  Do not cut corns or calluses or try to remove them with medicine.  Wear clean socks or stockings every day. Make sure they are not too tight. Do not wear knee-high stockings since they may decrease blood flow to your legs.  Wear shoes that fit properly and have enough cushioning. To break in new shoes, wear them for just a few hours a day. This prevents you from injuring your feet. Always look in your shoes before you put them on to be sure there are no objects inside.  Do not cross your legs. This may decrease the blood flow to your feet.  If you find a minor scrape,  cut, or break in the skin on your feet, keep it and the skin around it clean and dry. These areas may be cleansed with mild soap and water. Do not cleanse the area with peroxide, alcohol, or iodine.  When you remove an adhesive bandage, be sure not to damage the skin around it.  If you have a wound, look at it several times a day to make sure it is healing.  Do not use heating pads or hot water bottles. They may burn your skin. If you have lost feeling in your feet or legs, you may not know it is happening until it is too late.  Make sure your health care provider performs a complete foot exam at least annually or more often if you have foot problems. Report any cuts, sores, or bruises to your health care provider immediately. SEEK MEDICAL CARE IF:   You have an injury that is not healing.  You have cuts or breaks in the skin.  You have an ingrown nail.  You notice redness on your legs or feet.  You feel burning or tingling in your legs or feet.  You have pain or cramps in your legs and feet.  Your legs or feet are numb.  Your feet always feel cold. SEEK IMMEDIATE MEDICAL CARE IF:   There is increasing redness,   swelling, or pain in or around a wound.  There is a red line that goes up your leg.  Pus is coming from a wound.  You develop a fever or as directed by your health care provider.  You notice a bad smell coming from an ulcer or wound.   This information is not intended to replace advice given to you by your health care provider. Make sure you discuss any questions you have with your health care provider.   Document Released: 02/15/2000 Document Revised: 10/20/2012 Document Reviewed: 07/27/2012 Elsevier Interactive Patient Education 2016 [employer]  

## 2015-09-18 NOTE — Progress Notes (Signed)
Patient ID: [name] Church, female   DOB: [birth year], 81 y.o.   MRN: [id number]  Subjective: This patient presents for a scheduled visit complaining of painful toenails and requests toenail debridement  Objective: Orientated 3 No open skin lesions bilaterally The toenails are elongated, brittle, discolored, hypertrophic and tender to direct palpation 6-10  Assessment: Type II diabetic Symptomatic onychomycoses 6-10  Plan: Debridement toenails 10 mechanically and electrically without any bleeding  Reappoint 3 months

## 2015-12-25 ENCOUNTER — Encounter: Payer: Self-pay | Admitting: Podiatry

## 2015-12-25 ENCOUNTER — Ambulatory Visit (INDEPENDENT_AMBULATORY_CARE_PROVIDER_SITE_OTHER): Payer: Medicare Other | Admitting: Podiatry

## 2015-12-25 VITALS — BP 135/74 | HR 87 | Resp 16

## 2015-12-25 DIAGNOSIS — M79676 Pain in unspecified toe(s): Secondary | ICD-10-CM | POA: Diagnosis not present

## 2015-12-25 DIAGNOSIS — B351 Tinea unguium: Secondary | ICD-10-CM

## 2015-12-25 NOTE — Progress Notes (Signed)
Patient ID: [name] Church, female   DOB: October 22, 1934, 81 y.o.   MRN: [id number]    Subjective: This patient presents for a scheduled visit complaining of painful toenails and requests toenail debridement  Objective: Orientated 3 DP pulses 2/4 bilaterally PT pulses 1/4 bilaterally Capillary reflex immediate bilaterally Sensation to 10 g monofilament wire intact 5/5 bilaterally Vibratory sensation reactive bilaterally Ankle reflex equal and reactive bilaterally Hyperpronation left (history of trauma 2 years prior) Mallet toes second bilaterally Dorsi flexion, plantar flexion 5/5 bilaterally No open skin lesions bilaterally The toenails are elongated, brittle, discolored, hypertrophic and tender to direct palpation 6-10  Assessment: Type II diabetic Protective sensation intact bilaterally Hyperpronation left Symptomatic onychomycoses 6-10  Plan: Debridement toenails 10 mechanically and electrically without any bleeding  Reappoint 3 months

## 2015-12-25 NOTE — Patient Instructions (Signed)
Diabetes and Foot Care Diabetes may cause you to have problems because of poor blood supply (circulation) to your feet and legs. This may cause the skin on your feet to become thinner, break easier, and heal more slowly. Your skin may become dry, and the skin may peel and crack. You may also have nerve damage in your legs and feet causing decreased feeling in them. You may not notice minor injuries to your feet that could lead to infections or more serious problems. Taking care of your feet is one of the most important things you can do for yourself.  HOME CARE INSTRUCTIONS  Wear shoes at all times, even in the house. Do not go barefoot. Bare feet are easily injured.  Check your feet daily for blisters, cuts, and redness. If you cannot see the bottom of your feet, use a mirror or ask someone for help.  Wash your feet with warm water (do not use hot water) and mild soap. Then pat your feet and the areas between your toes until they are completely dry. Do not soak your feet as this can dry your skin.  Apply a moisturizing lotion or petroleum jelly (that does not contain alcohol and is unscented) to the skin on your feet and to dry, brittle toenails. Do not apply lotion between your toes.  Trim your toenails straight across. Do not dig under them or around the cuticle. File the edges of your nails with an emery board or nail file.  Do not cut corns or calluses or try to remove them with medicine.  Wear clean socks or stockings every day. Make sure they are not too tight. Do not wear knee-high stockings since they may decrease blood flow to your legs.  Wear shoes that fit properly and have enough cushioning. To break in new shoes, wear them for just a few hours a day. This prevents you from injuring your feet. Always look in your shoes before you put them on to be sure there are no objects inside.  Do not cross your legs. This may decrease the blood flow to your feet.  If you find a minor scrape,  cut, or break in the skin on your feet, keep it and the skin around it clean and dry. These areas may be cleansed with mild soap and water. Do not cleanse the area with peroxide, alcohol, or iodine.  When you remove an adhesive bandage, be sure not to damage the skin around it.  If you have a wound, look at it several times a day to make sure it is healing.  Do not use heating pads or hot water bottles. They may burn your skin. If you have lost feeling in your feet or legs, you may not know it is happening until it is too late.  Make sure your health care provider performs a complete foot exam at least annually or more often if you have foot problems. Report any cuts, sores, or bruises to your health care provider immediately. SEEK MEDICAL CARE IF:   You have an injury that is not healing.  You have cuts or breaks in the skin.  You have an ingrown nail.  You notice redness on your legs or feet.  You feel burning or tingling in your legs or feet.  You have pain or cramps in your legs and feet.  Your legs or feet are numb.  Your feet always feel cold. SEEK IMMEDIATE MEDICAL CARE IF:   There is increasing redness,   swelling, or pain in or around a wound.  There is a red line that goes up your leg.  Pus is coming from a wound.  You develop a fever or as directed by your health care provider.  You notice a bad smell coming from an ulcer or wound.   This information is not intended to replace advice given to you by your health care provider. Make sure you discuss any questions you have with your health care provider.   Document Released: 02/15/2000 Document Revised: 10/20/2012 Document Reviewed: 07/27/2012 Elsevier Interactive Patient Education 2016 [employer]  

## 2016-03-26 ENCOUNTER — Ambulatory Visit (INDEPENDENT_AMBULATORY_CARE_PROVIDER_SITE_OTHER): Payer: Medicare Other | Admitting: Podiatry

## 2016-03-26 ENCOUNTER — Encounter: Payer: Self-pay | Admitting: Podiatry

## 2016-03-26 VITALS — BP 97/48 | HR 88 | Resp 18

## 2016-03-26 DIAGNOSIS — M79676 Pain in unspecified toe(s): Secondary | ICD-10-CM

## 2016-03-26 DIAGNOSIS — B351 Tinea unguium: Secondary | ICD-10-CM | POA: Diagnosis not present

## 2016-03-26 NOTE — Patient Instructions (Signed)

## 2016-03-26 NOTE — Progress Notes (Signed)
Patient ID: [name] Church, female   DOB: 18-Sep-1934, 82 y.o.   MRN: [id number]    This patient presents for a scheduled visit complaining of painful toenails and requests toenail debridement  Objective: Orientated 3 DP pulses 2/4 bilaterally PT pulses 1/4 bilaterally Capillary reflex immediate bilaterally Sensation to 10 g monofilament wire intact 5/5 bilaterally Vibratory sensation reactive bilaterally Ankle reflex equal and reactive bilaterally Hyperpronation left (history of trauma 2 years prior) Mallet toes second bilaterally Dorsi flexion, plantar flexion 5/5 bilaterally No open skin lesions bilaterally The toenails are elongated, brittle, discolored, hypertrophic and tender to direct palpation 6-10  Assessment: Type II diabetic Protective sensation intact bilaterally Hyperpronation left Symptomatic onychomycoses 6-10  Plan: Debridement toenails 10 mechanically and electrically without any bleeding  Reappoint 3 months

## 2016-04-04 DIAGNOSIS — E1022 Type 1 diabetes mellitus with diabetic chronic kidney disease: Secondary | ICD-10-CM | POA: Diagnosis not present

## 2016-04-08 DIAGNOSIS — E1065 Type 1 diabetes mellitus with hyperglycemia: Secondary | ICD-10-CM | POA: Insufficient documentation

## 2016-04-08 DIAGNOSIS — E1022 Type 1 diabetes mellitus with diabetic chronic kidney disease: Secondary | ICD-10-CM | POA: Diagnosis not present

## 2016-04-08 DIAGNOSIS — N183 Chronic kidney disease, stage 3 (moderate): Secondary | ICD-10-CM | POA: Diagnosis not present

## 2016-04-08 DIAGNOSIS — M810 Age-related osteoporosis without current pathological fracture: Secondary | ICD-10-CM | POA: Insufficient documentation

## 2016-04-08 DIAGNOSIS — E103293 Type 1 diabetes mellitus with mild nonproliferative diabetic retinopathy without macular edema, bilateral: Secondary | ICD-10-CM | POA: Insufficient documentation

## 2016-04-08 DIAGNOSIS — E109 Type 1 diabetes mellitus without complications: Secondary | ICD-10-CM | POA: Insufficient documentation

## 2016-04-08 DIAGNOSIS — E039 Hypothyroidism, unspecified: Secondary | ICD-10-CM | POA: Insufficient documentation

## 2016-06-25 ENCOUNTER — Ambulatory Visit (INDEPENDENT_AMBULATORY_CARE_PROVIDER_SITE_OTHER): Payer: Medicare Other | Admitting: Podiatry

## 2016-06-25 DIAGNOSIS — M79676 Pain in unspecified toe(s): Secondary | ICD-10-CM

## 2016-06-25 DIAGNOSIS — R0989 Other specified symptoms and signs involving the circulatory and respiratory systems: Secondary | ICD-10-CM

## 2016-06-25 DIAGNOSIS — B351 Tinea unguium: Secondary | ICD-10-CM

## 2016-06-25 NOTE — Progress Notes (Signed)
Patient ID: [name] Church, female   DOB: 04-29-1934, 82 y.o.   MRN: [id number]    This patient presents for a scheduled visit complaining of painful toenails and requests toenail debridement  Objective: Orientated 3 DP pulses 2/4 bilaterally PT pulses 1/4 bilaterally Capillary reflex immediate bilaterally Sensation to 10 g monofilament wire intact 5/5 bilaterally Vibratory sensation reactive bilaterally Ankle reflex equal and reactive bilaterally Hyperpronation left (history of trauma 2 years prior) Mallet toes second bilaterally Dorsi flexion, plantar flexion 5/5 bilaterally No open skin lesions bilaterally Atrophic skin with absent hair growth bilaterally The toenails are elongated, brittle, discolored, hypertrophic and tender to direct palpation 6-10  Assessment: Type II diabetic Decrease pedal pulses Protective sensation intact bilaterally Hyperpronation left Symptomatic onychomycoses 6-10  Plan: Debridement toenails 10 mechanically and electrically without any bleeding  Reappoint 3 months

## 2016-06-25 NOTE — Patient Instructions (Signed)

## 2016-07-02 DIAGNOSIS — E1065 Type 1 diabetes mellitus with hyperglycemia: Secondary | ICD-10-CM | POA: Diagnosis not present

## 2016-07-02 DIAGNOSIS — E039 Hypothyroidism, unspecified: Secondary | ICD-10-CM | POA: Diagnosis not present

## 2016-07-07 DIAGNOSIS — E1022 Type 1 diabetes mellitus with diabetic chronic kidney disease: Secondary | ICD-10-CM | POA: Diagnosis not present

## 2016-07-07 DIAGNOSIS — E039 Hypothyroidism, unspecified: Secondary | ICD-10-CM | POA: Diagnosis not present

## 2016-07-07 DIAGNOSIS — E1065 Type 1 diabetes mellitus with hyperglycemia: Secondary | ICD-10-CM | POA: Diagnosis not present

## 2016-07-07 DIAGNOSIS — N183 Chronic kidney disease, stage 3 (moderate): Secondary | ICD-10-CM | POA: Diagnosis not present

## 2016-07-07 DIAGNOSIS — M810 Age-related osteoporosis without current pathological fracture: Secondary | ICD-10-CM | POA: Diagnosis not present

## 2016-07-07 DIAGNOSIS — E103293 Type 1 diabetes mellitus with mild nonproliferative diabetic retinopathy without macular edema, bilateral: Secondary | ICD-10-CM | POA: Diagnosis not present

## 2016-07-16 DIAGNOSIS — E113593 Type 2 diabetes mellitus with proliferative diabetic retinopathy without macular edema, bilateral: Secondary | ICD-10-CM | POA: Diagnosis not present

## 2016-07-16 DIAGNOSIS — H35371 Puckering of macula, right eye: Secondary | ICD-10-CM | POA: Diagnosis not present

## 2016-09-24 ENCOUNTER — Encounter: Payer: Self-pay | Admitting: Podiatry

## 2016-09-24 ENCOUNTER — Ambulatory Visit (INDEPENDENT_AMBULATORY_CARE_PROVIDER_SITE_OTHER): Payer: Medicare Other | Admitting: Podiatry

## 2016-09-24 DIAGNOSIS — B351 Tinea unguium: Secondary | ICD-10-CM

## 2016-09-24 DIAGNOSIS — R0989 Other specified symptoms and signs involving the circulatory and respiratory systems: Secondary | ICD-10-CM

## 2016-09-24 DIAGNOSIS — M79676 Pain in unspecified toe(s): Secondary | ICD-10-CM | POA: Diagnosis not present

## 2016-09-24 NOTE — Progress Notes (Signed)
Patient ID: [name] Church, female   DOB: 09-02-1934, 82 y.o.   MRN: [id number]   Subjective  This patient presents for a scheduled visit complaining of painful toenails and requests toenail debridement  Objective: Orientated 3 DP pulses 2/4 bilaterally PT pulses 1/4 bilaterally Capillary reflex immediate bilaterally Sensation to 10 g monofilament wire intact 5/5 bilaterally Vibratory sensation reactive bilaterally Ankle reflex equal and reactive bilaterally Hyperpronation left (history of trauma 2 years prior) Mallet toes second bilaterally Dorsi flexion, plantar flexion 5/5 bilaterally No open skin lesions bilaterally Atrophic skin with absent hair growth bilaterally The toenails are elongated, brittle, discolored, hypertrophic and tender to direct palpation 6-10  Assessment: Type II diabetic Decrease pedal pulses Protective sensation intact bilaterally Hyperpronation left Symptomatic onychomycoses 6-10  Plan: Debridement toenails 10 mechanically and electrically without any bleeding  Reappoint 3 months

## 2016-09-24 NOTE — Patient Instructions (Signed)

## 2016-10-02 DIAGNOSIS — E1065 Type 1 diabetes mellitus with hyperglycemia: Secondary | ICD-10-CM | POA: Diagnosis not present

## 2016-10-02 DIAGNOSIS — E039 Hypothyroidism, unspecified: Secondary | ICD-10-CM | POA: Diagnosis not present

## 2016-10-08 DIAGNOSIS — N183 Chronic kidney disease, stage 3 unspecified: Secondary | ICD-10-CM | POA: Insufficient documentation

## 2016-10-08 DIAGNOSIS — E103293 Type 1 diabetes mellitus with mild nonproliferative diabetic retinopathy without macular edema, bilateral: Secondary | ICD-10-CM | POA: Diagnosis not present

## 2016-10-08 DIAGNOSIS — E1022 Type 1 diabetes mellitus with diabetic chronic kidney disease: Secondary | ICD-10-CM | POA: Diagnosis not present

## 2016-10-08 DIAGNOSIS — Z794 Long term (current) use of insulin: Secondary | ICD-10-CM | POA: Diagnosis not present

## 2016-10-08 DIAGNOSIS — E1065 Type 1 diabetes mellitus with hyperglycemia: Secondary | ICD-10-CM | POA: Diagnosis not present

## 2016-10-08 DIAGNOSIS — M810 Age-related osteoporosis without current pathological fracture: Secondary | ICD-10-CM | POA: Diagnosis not present

## 2016-10-08 DIAGNOSIS — E039 Hypothyroidism, unspecified: Secondary | ICD-10-CM | POA: Diagnosis not present

## 2016-10-28 DIAGNOSIS — Z6835 Body mass index (BMI) 35.0-35.9, adult: Secondary | ICD-10-CM | POA: Diagnosis not present

## 2016-10-28 DIAGNOSIS — R2689 Other abnormalities of gait and mobility: Secondary | ICD-10-CM | POA: Diagnosis not present

## 2016-10-28 DIAGNOSIS — I10 Essential (primary) hypertension: Secondary | ICD-10-CM | POA: Diagnosis not present

## 2016-10-28 DIAGNOSIS — Z23 Encounter for immunization: Secondary | ICD-10-CM | POA: Diagnosis not present

## 2016-12-29 ENCOUNTER — Ambulatory Visit (INDEPENDENT_AMBULATORY_CARE_PROVIDER_SITE_OTHER): Payer: Medicare Other | Admitting: Podiatry

## 2016-12-29 ENCOUNTER — Encounter: Payer: Self-pay | Admitting: Podiatry

## 2016-12-29 DIAGNOSIS — M79676 Pain in unspecified toe(s): Secondary | ICD-10-CM | POA: Diagnosis not present

## 2016-12-29 DIAGNOSIS — E1151 Type 2 diabetes mellitus with diabetic peripheral angiopathy without gangrene: Secondary | ICD-10-CM | POA: Diagnosis not present

## 2016-12-29 DIAGNOSIS — B351 Tinea unguium: Secondary | ICD-10-CM | POA: Diagnosis not present

## 2016-12-29 NOTE — Patient Instructions (Signed)

## 2016-12-29 NOTE — Progress Notes (Signed)
Patient ID: [name] Church, female   DOB: 12/30/1934, 82 y.o.   MRN: [id number]    This patient presents for a scheduled visit complaining of painful toenails and requests toenail debridement  Objective: Orientated 3 DP pulses 2/4 bilaterally PT pulses 1/4 bilaterally Capillary reflex immediate bilaterally Sensation to 10 g monofilament wire intact 5/5 bilaterally Vibratory sensation reactive bilaterally Ankle reflex equal and reactive bilaterally Hyperpronation left (history of trauma 2 years prior) Mallet toes second bilaterally Dorsi flexion, plantar flexion 5/5 bilaterally No open skin lesions bilaterally Atrophic skin with absent hair growth bilaterally The toenails are elongated, brittle, discolored, hypertrophic and tender to direct palpation 6-10  Assessment: Type II diabetic Decrease pedal pulses, peripheral arterial disease Protective sensation intact bilaterally Hyperpronation left Symptomatic onychomycoses 6-10  Plan: Debridement toenails 10 mechanically and electrically without any bleeding  Reappoint 3 months

## 2017-01-05 DIAGNOSIS — E039 Hypothyroidism, unspecified: Secondary | ICD-10-CM | POA: Diagnosis not present

## 2017-01-05 DIAGNOSIS — M810 Age-related osteoporosis without current pathological fracture: Secondary | ICD-10-CM | POA: Diagnosis not present

## 2017-01-05 DIAGNOSIS — E1065 Type 1 diabetes mellitus with hyperglycemia: Secondary | ICD-10-CM | POA: Diagnosis not present

## 2017-01-08 DIAGNOSIS — E103293 Type 1 diabetes mellitus with mild nonproliferative diabetic retinopathy without macular edema, bilateral: Secondary | ICD-10-CM | POA: Diagnosis not present

## 2017-01-08 DIAGNOSIS — E039 Hypothyroidism, unspecified: Secondary | ICD-10-CM | POA: Diagnosis not present

## 2017-01-08 DIAGNOSIS — M810 Age-related osteoporosis without current pathological fracture: Secondary | ICD-10-CM | POA: Diagnosis not present

## 2017-01-08 DIAGNOSIS — E1065 Type 1 diabetes mellitus with hyperglycemia: Secondary | ICD-10-CM | POA: Diagnosis not present

## 2017-01-08 DIAGNOSIS — E1022 Type 1 diabetes mellitus with diabetic chronic kidney disease: Secondary | ICD-10-CM | POA: Diagnosis not present

## 2017-01-08 DIAGNOSIS — N183 Chronic kidney disease, stage 3 (moderate): Secondary | ICD-10-CM | POA: Diagnosis not present

## 2017-01-08 DIAGNOSIS — R600 Localized edema: Secondary | ICD-10-CM | POA: Diagnosis not present

## 2017-01-13 DIAGNOSIS — E113593 Type 2 diabetes mellitus with proliferative diabetic retinopathy without macular edema, bilateral: Secondary | ICD-10-CM | POA: Diagnosis not present

## 2017-03-30 ENCOUNTER — Ambulatory Visit: Payer: Medicare Other | Admitting: Podiatry

## 2017-04-06 ENCOUNTER — Ambulatory Visit: Payer: Medicare Other | Admitting: Podiatry

## 2017-04-06 ENCOUNTER — Encounter: Payer: Self-pay | Admitting: Podiatry

## 2017-04-06 DIAGNOSIS — B351 Tinea unguium: Secondary | ICD-10-CM | POA: Diagnosis not present

## 2017-04-06 DIAGNOSIS — M79674 Pain in right toe(s): Secondary | ICD-10-CM

## 2017-04-06 DIAGNOSIS — M79675 Pain in left toe(s): Secondary | ICD-10-CM | POA: Diagnosis not present

## 2017-04-06 DIAGNOSIS — M216X9 Other acquired deformities of unspecified foot: Secondary | ICD-10-CM

## 2017-04-06 DIAGNOSIS — E1151 Type 2 diabetes mellitus with diabetic peripheral angiopathy without gangrene: Secondary | ICD-10-CM

## 2017-04-08 NOTE — Progress Notes (Signed)
Subjective:   Patient ID: [name] Church, female   DOB: 83 y.o.   MRN: [id number]   HPI Patient presents with chronic nail disease 1-5 both feet and also has structural deformity with prominent metatarsal right foot that is increasingly sensitive with ambulation   ROS      Objective:  Physical Exam  Neurovascular status intact with patient found to have prominent fourth metatarsal right with reactive skin formation and pain with palpation with thick yellow brittle nailbeds 1-5 both feet that are painful     Assessment:  Plantarflexed metatarsal fourth right with pain and nail disease 1-5 both feet that are thick yellow brittle and painful     Plan:  Debrided mycotic nail infections 1-5 both feet with no iatrogenic bleeding and discussed lesion we discussed possibility at one point for elevating osteotomy or a deep type orthotic made soft materials to try to offload this area

## 2017-07-06 ENCOUNTER — Ambulatory Visit: Payer: Medicare Other | Admitting: Podiatry

## 2017-07-06 ENCOUNTER — Encounter: Payer: Self-pay | Admitting: Podiatry

## 2017-07-06 DIAGNOSIS — M79674 Pain in right toe(s): Secondary | ICD-10-CM

## 2017-07-06 DIAGNOSIS — M79675 Pain in left toe(s): Secondary | ICD-10-CM | POA: Diagnosis not present

## 2017-07-06 DIAGNOSIS — B351 Tinea unguium: Secondary | ICD-10-CM

## 2017-07-08 NOTE — Progress Notes (Signed)
Subjective:   Patient ID: [name] Church, female   DOB: 83 y.o.   MRN: [id number]   HPI Patient presents with nail disease 1-5 both feet that are painful and make shoe gear difficult   ROS      Objective:  Physical Exam  Thick yellow brittle nailbeds 1-5 both feet that are painful when pressed     Assessment:  Mycotic nail infection with pain 1-5 both feet     Plan:  Debride painful nailbeds 1-5 both feet with no iatrogenic bleeding noted

## 2017-10-05 ENCOUNTER — Ambulatory Visit: Payer: Medicare Other | Admitting: Podiatry

## 2017-10-05 DIAGNOSIS — M216X9 Other acquired deformities of unspecified foot: Secondary | ICD-10-CM

## 2017-10-05 DIAGNOSIS — M79676 Pain in unspecified toe(s): Secondary | ICD-10-CM

## 2017-10-05 DIAGNOSIS — M79675 Pain in left toe(s): Secondary | ICD-10-CM

## 2017-10-05 DIAGNOSIS — R0989 Other specified symptoms and signs involving the circulatory and respiratory systems: Secondary | ICD-10-CM

## 2017-10-05 DIAGNOSIS — M79674 Pain in right toe(s): Secondary | ICD-10-CM

## 2017-10-05 DIAGNOSIS — E1151 Type 2 diabetes mellitus with diabetic peripheral angiopathy without gangrene: Secondary | ICD-10-CM | POA: Diagnosis not present

## 2017-10-05 DIAGNOSIS — B351 Tinea unguium: Secondary | ICD-10-CM | POA: Diagnosis not present

## 2017-10-07 NOTE — Progress Notes (Signed)
   SUBJECTIVE Patient presents to office today complaining of elongated, thickened nails that cause pain while ambulating in shoes. She is unable to trim her own nails. Patient is here for further evaluation and treatment.  Past Medical History:  Diagnosis Date  . Diabetes mellitus without complication ([place])   . Edema   . Elevated serum immunoglobulin free light chain level 11/29/2013  . Macrocytosis without anemia 11/29/2013  . Osteoporosis   . Thyroid disease   . Unspecified deficiency anemia 11/29/2013    OBJECTIVE General Patient is awake, alert, and oriented x 3 and in no acute distress. Derm Skin is dry and supple bilateral. Negative open lesions or macerations. Remaining integument unremarkable. Nails are tender, long, thickened and dystrophic with subungual debris, consistent with onychomycosis, 1-5 bilateral. No signs of infection noted. Vasc  DP and PT pedal pulses palpable bilaterally. Temperature gradient within normal limits.  Neuro Epicritic and protective threshold sensation grossly intact bilaterally.  Musculoskeletal Exam No symptomatic pedal deformities noted bilateral. Muscular strength within normal limits.  ASSESSMENT 1. Onychodystrophic nails 1-5 bilateral with hyperkeratosis of nails.  2. Onychomycosis of nail due to dermatophyte bilateral 3. Pain in foot bilateral  PLAN OF CARE 1. Patient evaluated today.  2. Instructed to maintain good pedal hygiene and foot care.  3. Mechanical debridement of nails 1-5 bilaterally performed using a nail nipper. Filed with dremel without incident.  4. Return to clinic in 3 mos.    Edrick Kins, DPM [facility]  Dr. Edrick Kins, [place]                                        [place], [place] [address]                Office [phone]  Fax [phone]

## 2018-01-05 ENCOUNTER — Ambulatory Visit: Payer: Medicare Other | Admitting: Podiatry

## 2018-01-05 DIAGNOSIS — M79675 Pain in left toe(s): Secondary | ICD-10-CM | POA: Diagnosis not present

## 2018-01-05 DIAGNOSIS — M79674 Pain in right toe(s): Secondary | ICD-10-CM

## 2018-01-05 DIAGNOSIS — B351 Tinea unguium: Secondary | ICD-10-CM | POA: Diagnosis not present

## 2018-01-05 NOTE — Patient Instructions (Addendum)
Diabetic Neuropathy Diabetic neuropathy is a nerve disease or nerve damage that is caused by diabetes mellitus. About half of all people with diabetes mellitus have some form of nerve damage. Nerve damage is more common in those who have had diabetes mellitus for many years and who generally have not had good control of their blood sugar (glucose) level. Diabetic neuropathy is a common complication of diabetes mellitus. There are three common types of diabetic neuropathy and a fourth type that is less common and less understood:  Peripheral neuropathy-This is the most common type of diabetic neuropathy. It causes damage to the nerves of the feet and legs first and then eventually the hands and arms. The damage affects the ability to sense touch.  Autonomic neuropathy-This type causes damage to the autonomic nervous system, which controls the following functions: ? Heartbeat. ? Body temperature. ? Blood pressure. ? Urination. ? Digestion. ? Sweating. ? Sexual function.  Focal neuropathy-Focal neuropathy can be painful and unpredictable and occurs most often in older adults with diabetes mellitus. It involves a specific nerve or one area and often comes on suddenly. It usually does not cause long-term problems.  Radiculoplexus neuropathy- Sometimes called lumbosacral radiculoplexus neuropathy, radiculoplexus neuropathy affects the nerves of the thighs, hips, buttocks, or legs. It is more common in people with type 2 diabetes mellitus and in older men. It is characterized by debilitating pain, weakness, and atrophy, usually in the thigh muscles.  What are the causes? The cause of peripheral, autonomic, and focal neuropathies is diabetes mellitus that is uncontrolled and high glucose levels. The cause of radiculoplexus neuropathy is unknown. However, it is thought to be caused by inflammation related to uncontrolled glucose levels. What are the signs or symptoms? Peripheral Neuropathy Peripheral  neuropathy develops slowly over time. When the nerves of the feet and legs no longer work there may be:  Burning, stabbing, or aching pain in the legs or feet.  Inability to feel pressure or pain in your feet. This can lead to: ? Thick calluses over pressure areas. ? Pressure sores. ? Ulcers.  Foot deformities.  Reduced ability to feel temperature changes.  Muscle weakness.  Autonomic Neuropathy The symptoms of autonomic neuropathy vary depending on which nerves are affected. Symptoms may include:  Problems with digestion, such as: ? Feeling sick to your stomach (nausea). ? Vomiting. ? Bloating. ? Constipation. ? Diarrhea. ? Abdominal pain.  Difficulty with urination. This occurs if you lose your ability to sense when your bladder is full. Problems include: ? Urine leakage (incontinence). ? Inability to empty your bladder completely (retention).  Rapid or irregular heartbeat (palpitations).  Blood pressure drops when you stand up (orthostatic hypotension). When you stand up you may feel: ? Dizzy. ? Weak. ? Faint.  In men, inability to attain and maintain an erection.  In women, vaginal dryness and problems with decreased sexual desire and arousal.  Problems with body temperature regulation.  Increased or decreased sweating.  Focal Neuropathy  Abnormal eye movements or abnormal alignment of both eyes.  Weakness in the wrist.  Foot drop. This results in an inability to lift the foot properly and abnormal walking or foot movement.  Paralysis on one side of your face (Bell palsy).  Chest or abdominal pain. Radiculoplexus Neuropathy  Sudden, severe pain in your hip, thigh, or buttocks.  Weakness and wasting of thigh muscles.  Difficulty rising from a seated position.  Abdominal swelling.  Unexplained weight loss (usually more than 10 lb [4.5 kg]). How is   this diagnosed? Peripheral Neuropathy Your senses may be tested. Sensory function testing can be  done with:  A light touch using a monofilament.  A vibration with tuning fork.  A sharp sensation with a pin prick.  Other tests that can help diagnose neuropathy are:  Nerve conduction velocity. This test checks the transmission of an electrical current through a nerve.  Electromyography. This shows how muscles respond to electrical signals transmitted by nearby nerves.  Quantitative sensory testing. This is used to assess how your nerves respond to vibrations and changes in temperature.  Autonomic Neuropathy Diagnosis is often based on reported symptoms. Tell your health care provider if you experience:  Dizziness.  Constipation.  Diarrhea.  Inappropriate urination or inability to urinate.  Inability to get or maintain an erection.  Tests that may be done include:  Electrocardiography or Holter monitor. These are tests that can help show problems with the heart rate or heart rhythm.  An X-ray exam may be done.  Focal Neuropathy Diagnosis is made based on your symptoms and what your health care provider finds during your exam. Other tests may be done. They may include:  Nerve conduction velocities. This checks the transmission of electrical current through a nerve.  Electromyography. This shows how muscles respond to electrical signals transmitted by nearby nerves.  Quantitative sensory testing. This test is used to assess how your nerves respond to vibration and changes in temperature.  Radiculoplexus Neuropathy  Often the first thing is to eliminate any other issue or problems that might be the cause, as there is no standard test for diagnosis.  X-ray exam of your spine and lumbar region.  Spinal tap to rule out cancer.  MRI to rule out other lesions. How is this treated? Once nerve damage occurs, it cannot be reversed. The goal of treatment is to keep the disease or nerve damage from getting worse and affecting more nerve fibers. Controlling your blood  glucose level is the key. Most people with radiculoplexus neuropathy see at least a partial improvement over time. You will need to keep your blood glucose and HbA1c levels in the target range determined by your health care provider. Things that help control blood glucose levels include:  Blood glucose monitoring.  Meal planning.  Physical activity.  Diabetes medicine.  Over time, maintaining lower blood glucose levels helps lessen symptoms. Sometimes, prescription pain medicine is needed. Follow these instructions at home:  Do not smoke.  Keep your blood glucose level in the range that you and your health care provider have determined acceptable for you.  Keep your blood pressure level in the range that you and your health care provider have determined acceptable for you.  Eat a well-balanced diet.  Be physically active every day. Include strength training and balance exercises.  Protect your feet. ? Check your feet every day for sores, cuts, blisters, or signs of infection. ? Wear padded socks and supportive shoes. Use orthotic inserts, if necessary. ? Regularly check the insides of your shoes for worn spots. Make sure there are no rocks or other items inside your shoes before you put them on. Contact a health care provider if:  You have burning, stabbing, or aching pain in the legs or feet.  You are unable to feel pressure or pain in your feet.  You develop problems with digestion such as: ? Nausea. ? Vomiting. ? Bloating. ? Constipation. ? Diarrhea. ? Abdominal pain.  You have difficulty with urination, such as: ? Incontinence. ? Retention.    You have palpitations.  You develop orthostatic hypotension. When you stand up you may feel: ? Dizzy. ? Weak. ? Faint.  You cannot attain and maintain an erection (in men).  You have vaginal dryness and problems with decreased sexual desire and arousal (in women).  You have severe pain in your thighs, legs, or  buttocks.  You have unexplained weight loss. This information is not intended to replace advice given to you by your health care provider. Make sure you discuss any questions you have with your health care provider. Document Released: 04/28/2001 Document Revised: 07/26/2015 Document Reviewed: 07/29/2012 Elsevier Interactive Patient Education  2017 [employer]  

## 2018-01-23 ENCOUNTER — Encounter: Payer: Self-pay | Admitting: Podiatry

## 2018-01-23 NOTE — Progress Notes (Signed)
Subjective: [name] Church presents for follow-up evaluation of painful, discolored, thick toenails which interfere with daily activities and routine tasks.  Her pain is aggravated when wearing enclosed shoe gear and relieved with periodic professional debridement.  Objective: Vascular Examination: Capillary refill time immediate x 10 digits Dorsalis pedis and posterior tibial pulses present b/l Sparse digital hair x 10 digits Skin temperature gradient within normal limits bilaterally  Dermatological Examination: Skin with normal turgor texture and tone bilaterally  Toenails 1-5 b/l discolored, thick, dystrophic with subungual debris and pain with palpation to nailbeds due to thickness of nails.  Musculoskeletal: Muscle strength 5/5 to all LE muscle groups  Neurological: Sensation intact with 10 gram monofilament. Vibratory sensation intact.  Assessment: Painful onychomycosis toenails 1-5 b/l   Plan: 1. Toenails 1-5 b/l were debrided in length and girth without iatrogenic bleeding. 2. Patient to continue soft, supportive shoe gear 3. Patient to report any pedal injuries to medical professional immediately. 4. Follow up 3 months. Patient/POA to call should there be a concern in the interim.

## 2018-04-06 ENCOUNTER — Ambulatory Visit: Payer: Medicare Other | Admitting: Podiatry

## 2018-04-06 DIAGNOSIS — M79675 Pain in left toe(s): Secondary | ICD-10-CM | POA: Diagnosis not present

## 2018-04-06 DIAGNOSIS — M79674 Pain in right toe(s): Secondary | ICD-10-CM

## 2018-04-06 DIAGNOSIS — B351 Tinea unguium: Secondary | ICD-10-CM | POA: Diagnosis not present

## 2018-04-06 NOTE — Patient Instructions (Signed)

## 2018-04-11 ENCOUNTER — Encounter: Payer: Self-pay | Admitting: Podiatry

## 2018-04-11 NOTE — Progress Notes (Signed)
Subjective: [name] Church presents today with painful, thick toenails 1-5 b/l that she cannot cut and which interfere with daily activities.  Pain is aggravated when wearing enclosed shoe gear.  Her blood sugar was 220 mg/dl this morning.  Jonathon Jordan, MD is her PCP.   Current Outpatient Medications:  .  ACCU-CHEK AVIVA PLUS test strip, USE TO TEST BLOOD SUGARS QID, Disp: , Rfl: 10 .  ACCU-CHEK FASTCLIX LANCETS MISC, , Disp: , Rfl:  .  aspirin 325 MG EC tablet, Take 325 mg by mouth daily., Disp: , Rfl:  .  BD PEN NEEDLE NANO U/F 32G X 4 MM MISC, USE AS DIRECTED 4-5 TIMES A DAY, Disp: , Rfl: 4 .  ferrous sulfate 325 (65 FE) MG tablet, Take 1 tablet once a day, Disp: , Rfl:  .  furosemide (LASIX) 20 MG tablet, Take 20 mg by mouth., Disp: , Rfl:  .  HUMALOG KWIKPEN 100 UNIT/ML KiwkPen, 2 (two) times daily., Disp: , Rfl: 5 .  hydrochlorothiazide (MICROZIDE) 12.5 MG capsule, Take 12.5 mg by mouth daily., Disp: , Rfl:  .  Iron 66 MG TABS, Take by mouth., Disp: , Rfl:  .  Lancet Devices ([employer] LANCING DEVICE) MISC, , Disp: , Rfl:  .  levothyroxine (SYNTHROID, LEVOTHROID) 137 MCG tablet, Take 137 mcg by mouth daily before breakfast., Disp: , Rfl:  .  lisinopril (PRINIVIL,ZESTRIL) 5 MG tablet, Take 5 mg by mouth daily., Disp: , Rfl:  .  loratadine (CLARITIN) 10 MG tablet, Take 1 tablet once a day as needed, Disp: , Rfl:  .  Multiple Minerals-Vitamins (CALCIUM & VIT D3 BONE HEALTH PO), Take by mouth., Disp: , Rfl:  .  Multiple Vitamin (MULTI-VITAMINS) TABS, Take 1 tablet once a day, Disp: , Rfl:  .  Multiple Vitamins-Minerals (MULTIVITAMIN WITH MINERALS) tablet, Take 1 tablet by mouth daily., Disp: , Rfl:  .  TRESIBA FLEXTOUCH 100 UNIT/ML SOPN, INJECT 20 UNITS QAM ONCE A DAY, Disp: , Rfl: 4 .  Vitamin D, Cholecalciferol, 1000 UNITS TABS, Take by mouth., Disp: , Rfl:   Allergies  Allergen Reactions  . Sulfa Antibiotics   . Sulfamethoxazole Rash    Objective:  Vascular  Examination: Capillary refill time immediate x 10 digits  Dorsalis pedis and Posterior tibial pulses palpable b/l  Sparse digital hair present x 10 digits  Skin temperature gradient WNL b/l  Dermatological Examination: Skin with normal turgor, texture and tone b/l  Toenails 1-5 b/l discolored, thick, dystrophic with subungual debris and pain with palpation to nailbeds due to thickness of nails.  Musculoskeletal: Muscle strength 5/5 to all LE muscle groups.  Neurological: Sensation intact with 10 gram monofilament.  Vibratory sensation intact.  Assessment: Painful onychomycosis toenails 1-5 b/l   Plan: 1. Toenails 1-5 b/l were debrided in length and girth without iatrogenic bleeding. 2. Patient to continue soft, supportive shoe gear 3. Patient to report any pedal injuries to medical professional immediately. 4. Follow up 3 months.  5. Patient/POA to call should there be a concern in the interim.

## 2018-07-06 ENCOUNTER — Ambulatory Visit: Payer: Medicare Other | Admitting: Podiatry

## 2018-07-30 ENCOUNTER — Ambulatory Visit: Payer: Medicare Other | Admitting: Podiatry

## 2018-07-30 ENCOUNTER — Other Ambulatory Visit: Payer: Self-pay

## 2018-07-30 VITALS — Temp 97.3°F

## 2018-07-30 DIAGNOSIS — M2141 Flat foot [pes planus] (acquired), right foot: Secondary | ICD-10-CM

## 2018-07-30 DIAGNOSIS — M79674 Pain in right toe(s): Secondary | ICD-10-CM

## 2018-07-30 DIAGNOSIS — E1022 Type 1 diabetes mellitus with diabetic chronic kidney disease: Secondary | ICD-10-CM | POA: Diagnosis not present

## 2018-07-30 DIAGNOSIS — M79675 Pain in left toe(s): Secondary | ICD-10-CM | POA: Diagnosis not present

## 2018-07-30 DIAGNOSIS — N183 Chronic kidney disease, stage 3 unspecified: Secondary | ICD-10-CM

## 2018-07-30 DIAGNOSIS — M2142 Flat foot [pes planus] (acquired), left foot: Secondary | ICD-10-CM

## 2018-07-30 DIAGNOSIS — B351 Tinea unguium: Secondary | ICD-10-CM | POA: Diagnosis not present

## 2018-07-30 NOTE — Patient Instructions (Signed)
Diabetes Mellitus and Foot Care Foot care is an important part of your health, especially when you have diabetes. Diabetes may cause you to have problems because of poor blood flow (circulation) to your feet and legs, which can cause your skin to:  Become thinner and drier.  Break more easily.  Heal more slowly.  Peel and crack. You may also have nerve damage (neuropathy) in your legs and feet, causing decreased feeling in them. This means that you may not notice minor injuries to your feet that could lead to more serious problems. Noticing and addressing any potential problems early is the best way to prevent future foot problems. How to care for your feet Foot hygiene  Wash your feet daily with warm water and mild soap. Do not use hot water. Then, pat your feet and the areas between your toes until they are completely dry. Do not soak your feet as this can dry your skin.  Trim your toenails straight across. Do not dig under them or around the cuticle. File the edges of your nails with an emery board or nail file.  Apply a moisturizing lotion or petroleum jelly to the skin on your feet and to dry, brittle toenails. Use lotion that does not contain alcohol and is unscented. Do not apply lotion between your toes. Shoes and socks  Wear clean socks or stockings every day. Make sure they are not too tight. Do not wear knee-high stockings since they may decrease blood flow to your legs.  Wear shoes that fit properly and have enough cushioning. Always look in your shoes before you put them on to be sure there are no objects inside.  To break in new shoes, wear them for just a few hours a day. This prevents injuries on your feet. Wounds, scrapes, corns, and calluses  Check your feet daily for blisters, cuts, bruises, sores, and redness. If you cannot see the bottom of your feet, use a mirror or ask someone for help.  Do not cut corns or calluses or try to remove them with medicine.  If you  find a minor scrape, cut, or break in the skin on your feet, keep it and the skin around it clean and dry. You may clean these areas with mild soap and water. Do not clean the area with peroxide, alcohol, or iodine.  If you have a wound, scrape, corn, or callus on your foot, look at it several times a day to make sure it is healing and not infected. Check for: ? Redness, swelling, or pain. ? Fluid or blood. ? Warmth. ? Pus or a bad smell. General instructions  Do not cross your legs. This may decrease blood flow to your feet.  Do not use heating pads or hot water bottles on your feet. They may burn your skin. If you have lost feeling in your feet or legs, you may not know this is happening until it is too late.  Protect your feet from hot and cold by wearing shoes, such as at the beach or on hot pavement.  Schedule a complete foot exam at least once a year (annually) or more often if you have foot problems. If you have foot problems, report any cuts, sores, or bruises to your health care provider immediately. Contact a health care provider if:  You have a medical condition that increases your risk of infection and you have any cuts, sores, or bruises on your feet.  You have an injury that is not   healing.  You have redness on your legs or feet.  You feel burning or tingling in your legs or feet.  You have pain or cramps in your legs and feet.  Your legs or feet are numb.  Your feet always feel cold.  You have pain around a toenail. Get help right away if:  You have a wound, scrape, corn, or callus on your foot and: ? You have pain, swelling, or redness that gets worse. ? You have fluid or blood coming from the wound, scrape, corn, or callus. ? Your wound, scrape, corn, or callus feels warm to the touch. ? You have pus or a bad smell coming from the wound, scrape, corn, or callus. ? You have a fever. ? You have a red line going up your leg. Summary  Check your feet every day  for cuts, sores, red spots, swelling, and blisters.  Moisturize feet and legs daily.  Wear shoes that fit properly and have enough cushioning.  If you have foot problems, report any cuts, sores, or bruises to your health care provider immediately.  Schedule a complete foot exam at least once a year (annually) or more often if you have foot problems. This information is not intended to replace advice given to you by your health care provider. Make sure you discuss any questions you have with your health care provider. Document Released: 02/15/2000 Document Revised: 04/01/2017 Document Reviewed: 03/21/2016 Elsevier Interactive Patient Education  2019 [employer].  Onychomycosis/Fungal Toenails  WHAT IS IT? An infection that lies within the keratin of your nail plate that is caused by a fungus.  WHY ME? Fungal infections affect all ages, sexes, races, and creeds.  There may be many factors that predispose you to a fungal infection such as age, coexisting medical conditions such as diabetes, or an autoimmune disease; stress, medications, fatigue, genetics, etc.  Bottom line: fungus thrives in a warm, moist environment and your shoes offer such a location.  IS IT CONTAGIOUS? Theoretically, yes.  You do not want to share shoes, nail clippers or files with someone who has fungal toenails.  Walking around barefoot in the same room or sleeping in the same bed is unlikely to transfer the organism.  It is important to realize, however, that fungus can spread easily from one nail to the next on the same foot.  HOW DO WE TREAT THIS?  There are several ways to treat this condition.  Treatment may depend on many factors such as age, medications, pregnancy, liver and kidney conditions, etc.  It is best to ask your doctor which options are available to you.  1. No treatment.   Unlike many other medical concerns, you can live with this condition.  However for many people this can be a painful condition and  may lead to ingrown toenails or a bacterial infection.  It is recommended that you keep the nails cut short to help reduce the amount of fungal nail. 2. Topical treatment.  These range from herbal remedies to prescription strength nail lacquers.  About 40-50% effective, topicals require twice daily application for approximately 9 to 12 months or until an entirely new nail has grown out.  The most effective topicals are medical grade medications available through physicians offices. 3. Oral antifungal medications.  With an 80-90% cure rate, the most common oral medication requires 3 to 4 months of therapy and stays in your system for a year as the new nail grows out.  Oral antifungal medications do require   blood work to make sure it is a safe drug for you.  A liver function panel will be performed prior to starting the medication and after the first month of treatment.  It is important to have the blood work performed to avoid any harmful side effects.  In general, this medication safe but blood work is required. 4. Laser Therapy.  This treatment is performed by applying a specialized laser to the affected nail plate.  This therapy is noninvasive, fast, and non-painful.  It is not covered by insurance and is therefore, out of pocket.  The results have been very good with a 80-95% cure rate.  The [facility] is the only practice in the area to offer this therapy. 5. Permanent Nail Avulsion.  Removing the entire nail so that a new nail will not grow back. 

## 2018-08-01 ENCOUNTER — Encounter: Payer: Self-pay | Admitting: Podiatry

## 2018-08-01 NOTE — Progress Notes (Signed)
Subjective:  [name] Church presents to clinic today with cc of  painful, thick, discolored, elongated toenails 1-5 b/l that become tender and cannot cut because of thickness.  Pain is aggravated when wearing enclosed shoe gear.  Jonathon Jordan, MD is her PCP.    Current Outpatient Medications:  .  ACCU-CHEK AVIVA PLUS test strip, USE TO TEST BLOOD SUGARS QID, Disp: , Rfl: 10 .  ACCU-CHEK FASTCLIX LANCETS MISC, , Disp: , Rfl:  .  aspirin 325 MG EC tablet, Take 325 mg by mouth daily., Disp: , Rfl:  .  BD PEN NEEDLE NANO U/F 32G X 4 MM MISC, USE AS DIRECTED 4-5 TIMES A DAY, Disp: , Rfl: 4 .  ferrous sulfate 325 (65 FE) MG tablet, Take 1 tablet once a day, Disp: , Rfl:  .  furosemide (LASIX) 20 MG tablet, Take 20 mg by mouth., Disp: , Rfl:  .  HUMALOG KWIKPEN 100 UNIT/ML KiwkPen, 2 (two) times daily., Disp: , Rfl: 5 .  hydrochlorothiazide (MICROZIDE) 12.5 MG capsule, Take 12.5 mg by mouth daily., Disp: , Rfl:  .  Iron 66 MG TABS, Take by mouth., Disp: , Rfl:  .  Lancet Devices ([employer] LANCING DEVICE) MISC, , Disp: , Rfl:  .  levothyroxine (SYNTHROID, LEVOTHROID) 137 MCG tablet, Take 137 mcg by mouth daily before breakfast., Disp: , Rfl:  .  lisinopril (PRINIVIL,ZESTRIL) 5 MG tablet, Take 5 mg by mouth daily., Disp: , Rfl:  .  loratadine (CLARITIN) 10 MG tablet, Take 1 tablet once a day as needed, Disp: , Rfl:  .  Multiple Minerals-Vitamins (CALCIUM & VIT D3 BONE HEALTH PO), Take by mouth., Disp: , Rfl:  .  Multiple Vitamin (MULTI-VITAMINS) TABS, Take 1 tablet once a day, Disp: , Rfl:  .  Multiple Vitamins-Minerals (MULTIVITAMIN WITH MINERALS) tablet, Take 1 tablet by mouth daily., Disp: , Rfl:  .  TRESIBA FLEXTOUCH 100 UNIT/ML SOPN, INJECT 20 UNITS QAM ONCE A DAY, Disp: , Rfl: 4 .  Vitamin D, Cholecalciferol, 1000 UNITS TABS, Take by mouth., Disp: , Rfl:    Allergies  Allergen Reactions  . Sulfa Antibiotics   . Sulfamethoxazole Rash     Objective: Vitals:   07/30/18 0930  Temp: (!)  97.3 F (36.3 C)    Physical Examination:  Vascular Examination: Capillary refill time immediate x 10 digits.  Palpable DP/PT pulses b/l.  Digital hair sparse b/l.  No edema noted b/l.  Skin temperature gradient WNL b/l.  Dermatological Examination: Skin with normal turgor, texture and tone b/l.  No open wounds b/l.  No interdigital macerations noted b/l.  Elongated, thick, discolored brittle toenails with subungual debris and pain on dorsal palpation of nailbeds 1-5 b/l.  Musculoskeletal Examination: Muscle strength 5/5 to all muscle groups b/l.  Pes planus b/l.  No pain, crepitus or joint discomfort with active/passive ROM.  Neurological Examination: Sensation intact 5/5 b/l with 10 gram monofilament.  Vibratory sensation intact b/l.  Proprioceptive sensation intact b/l.  Assessment: Mycotic nail infection with pain 1-5 b/l Pes planus foot type b/l IDDM with CKD stage 3  Plan: 1. Toenails 1-5 b/l were debrided in length and girth without iatrogenic laceration. 2.  Continue soft, supportive shoe gear daily. Recommneded she consider diabetic shoes due to severe pes planus foot type which could collapse at the midfoot. She will see Velora Heckler for evaluation on the same day as her next visit. Per Medicare guidelines, patient's feet need to be evaluated by an MD/DO managing patient's diabetes and  diabetic shoe certification form needs to be signed by the MD/DO. If patient's diabetes is being managed by an Endocrinologist, the Endocrinologist must evaluate patient's feet and sign the Medicare diabetic shoe certification form. 3.  Report any pedal injuries to medical professional. 4.  Follow up 3 months. 5.  Patient/POA to call should there be a question/concern in there interim.

## 2019-03-15 ENCOUNTER — Other Ambulatory Visit: Payer: Self-pay

## 2019-03-15 ENCOUNTER — Ambulatory Visit: Payer: Medicare Other | Admitting: Podiatry

## 2019-03-15 ENCOUNTER — Encounter: Payer: Self-pay | Admitting: Podiatry

## 2019-03-15 DIAGNOSIS — N183 Chronic kidney disease, stage 3 unspecified: Secondary | ICD-10-CM

## 2019-03-15 DIAGNOSIS — M79675 Pain in left toe(s): Secondary | ICD-10-CM

## 2019-03-15 DIAGNOSIS — M79674 Pain in right toe(s): Secondary | ICD-10-CM | POA: Diagnosis not present

## 2019-03-15 DIAGNOSIS — B351 Tinea unguium: Secondary | ICD-10-CM

## 2019-03-15 NOTE — Patient Instructions (Signed)
Diabetes Mellitus and Foot Care Foot care is an important part of your health, especially when you have diabetes. Diabetes may cause you to have problems because of poor blood flow (circulation) to your feet and legs, which can cause your skin to:  Become thinner and drier.  Break more easily.  Heal more slowly.  Peel and crack. You may also have nerve damage (neuropathy) in your legs and feet, causing decreased feeling in them. This means that you may not notice minor injuries to your feet that could lead to more serious problems. Noticing and addressing any potential problems early is the best way to prevent future foot problems. How to care for your feet Foot hygiene  Wash your feet daily with warm water and mild soap. Do not use hot water. Then, pat your feet and the areas between your toes until they are completely dry. Do not soak your feet as this can dry your skin.  Trim your toenails straight across. Do not dig under them or around the cuticle. File the edges of your nails with an emery board or nail file.  Apply a moisturizing lotion or petroleum jelly to the skin on your feet and to dry, brittle toenails. Use lotion that does not contain alcohol and is unscented. Do not apply lotion between your toes. Shoes and socks  Wear clean socks or stockings every day. Make sure they are not too tight. Do not wear knee-high stockings since they may decrease blood flow to your legs.  Wear shoes that fit properly and have enough cushioning. Always look in your shoes before you put them on to be sure there are no objects inside.  To break in new shoes, wear them for just a few hours a day. This prevents injuries on your feet. Wounds, scrapes, corns, and calluses  Check your feet daily for blisters, cuts, bruises, sores, and redness. If you cannot see the bottom of your feet, use a mirror or ask someone for help.  Do not cut corns or calluses or try to remove them with medicine.  If you  find a minor scrape, cut, or break in the skin on your feet, keep it and the skin around it clean and dry. You may clean these areas with mild soap and water. Do not clean the area with peroxide, alcohol, or iodine.  If you have a wound, scrape, corn, or callus on your foot, look at it several times a day to make sure it is healing and not infected. Check for: ? Redness, swelling, or pain. ? Fluid or blood. ? Warmth. ? Pus or a bad smell. General instructions  Do not cross your legs. This may decrease blood flow to your feet.  Do not use heating pads or hot water bottles on your feet. They may burn your skin. If you have lost feeling in your feet or legs, you may not know this is happening until it is too late.  Protect your feet from hot and cold by wearing shoes, such as at the beach or on hot pavement.  Schedule a complete foot exam at least once a year (annually) or more often if you have foot problems. If you have foot problems, report any cuts, sores, or bruises to your health care provider immediately. Contact a health care provider if:  You have a medical condition that increases your risk of infection and you have any cuts, sores, or bruises on your feet.  You have an injury that is not   healing.  You have redness on your legs or feet.  You feel burning or tingling in your legs or feet.  You have pain or cramps in your legs and feet.  Your legs or feet are numb.  Your feet always feel cold.  You have pain around a toenail. Get help right away if:  You have a wound, scrape, corn, or callus on your foot and: ? You have pain, swelling, or redness that gets worse. ? You have fluid or blood coming from the wound, scrape, corn, or callus. ? Your wound, scrape, corn, or callus feels warm to the touch. ? You have pus or a bad smell coming from the wound, scrape, corn, or callus. ? You have a fever. ? You have a red line going up your leg. Summary  Check your feet every day  for cuts, sores, red spots, swelling, and blisters.  Moisturize feet and legs daily.  Wear shoes that fit properly and have enough cushioning.  If you have foot problems, report any cuts, sores, or bruises to your health care provider immediately.  Schedule a complete foot exam at least once a year (annually) or more often if you have foot problems. This information is not intended to replace advice given to you by your health care provider. Make sure you discuss any questions you have with your health care provider. Document Revised: 11/10/2018 Document Reviewed: 03/21/2016 Elsevier Patient Education  2020 [employer]  

## 2019-03-15 NOTE — Progress Notes (Signed)
Subjective: [name] Church presents today for preventative diabetic foot. Patient is seen for follow up of painful, mycotic toenails which interfere with comfortable ambulation when wearing enclosed shoe gear. Pain is relieved with periodic professional debridement.  Medications reviewed in chart.  Allergies  Allergen Reactions  . Sulfa Antibiotics   . Sulfamethoxazole Rash    Objective: There were no vitals filed for this visit.  Vascular Examination: Capillary refill time immediate x 10 digits.  Dorsalis pedis present b/l.  Posterior tibial pulses present b/l.  Digital hair sparse b/l.   Skin temperature gradient WNL b/l.   Dermatological Examination: Skin with normal turgor, texture and tone b/l.  Toenails 1-5 b/l discolored, thick, dystrophic with subungual debris and pain with palpation to nailbeds due to thickness of nails.  Musculoskeletal: Muscle strength 5/5 b/l to all LE muscle groups.  Gross bony deformities:  Pes planus foot deformity b/l.Marland Kitchen  No pain, crepitus or joint limitation with passive/active ROM b/l.  Neurological Examination: Protective sensation intact 5/5 b/l with 10 gram monofilament.  Vibratory sensation intact bilaterally.   Assessment: 1. Painful onychomycosis toenails 1-5 b/l 2. IDDM with CKD stage 3   Plan: 1. Continue diabetic foot care principles. Literature dispensed on today. 2. Toenails 1-5 b/l were debrided in length and girth without iatrogenic bleeding. 3. Patient to continue soft, supportive shoe gear. 4. Patient to report any pedal injuries to medical professional. 5. Follow up 3 months.  6. Patient/POA to call should there be a concern in the interim.

## 2019-06-10 ENCOUNTER — Ambulatory Visit: Payer: Medicare Other | Admitting: Podiatry

## 2019-06-10 ENCOUNTER — Encounter: Payer: Self-pay | Admitting: Podiatry

## 2019-06-10 ENCOUNTER — Other Ambulatory Visit: Payer: Self-pay

## 2019-06-10 VITALS — Temp 97.1°F

## 2019-06-10 DIAGNOSIS — M79675 Pain in left toe(s): Secondary | ICD-10-CM

## 2019-06-10 DIAGNOSIS — B351 Tinea unguium: Secondary | ICD-10-CM | POA: Diagnosis not present

## 2019-06-10 DIAGNOSIS — E1022 Type 1 diabetes mellitus with diabetic chronic kidney disease: Secondary | ICD-10-CM | POA: Diagnosis not present

## 2019-06-10 DIAGNOSIS — N183 Chronic kidney disease, stage 3 unspecified: Secondary | ICD-10-CM

## 2019-06-10 DIAGNOSIS — M79674 Pain in right toe(s): Secondary | ICD-10-CM

## 2019-06-10 NOTE — Patient Instructions (Signed)
Diabetes Mellitus and Foot Care Foot care is an important part of your health, especially when you have diabetes. Diabetes may cause you to have problems because of poor blood flow (circulation) to your feet and legs, which can cause your skin to:  Become thinner and drier.  Break more easily.  Heal more slowly.  Peel and crack. You may also have nerve damage (neuropathy) in your legs and feet, causing decreased feeling in them. This means that you may not notice minor injuries to your feet that could lead to more serious problems. Noticing and addressing any potential problems early is the best way to prevent future foot problems. How to care for your feet Foot hygiene  Wash your feet daily with warm water and mild soap. Do not use hot water. Then, pat your feet and the areas between your toes until they are completely dry. Do not soak your feet as this can dry your skin.  Trim your toenails straight across. Do not dig under them or around the cuticle. File the edges of your nails with an emery board or nail file.  Apply a moisturizing lotion or petroleum jelly to the skin on your feet and to dry, brittle toenails. Use lotion that does not contain alcohol and is unscented. Do not apply lotion between your toes. Shoes and socks  Wear clean socks or stockings every day. Make sure they are not too tight. Do not wear knee-high stockings since they may decrease blood flow to your legs.  Wear shoes that fit properly and have enough cushioning. Always look in your shoes before you put them on to be sure there are no objects inside.  To break in new shoes, wear them for just a few hours a day. This prevents injuries on your feet. Wounds, scrapes, corns, and calluses  Check your feet daily for blisters, cuts, bruises, sores, and redness. If you cannot see the bottom of your feet, use a mirror or ask someone for help.  Do not cut corns or calluses or try to remove them with medicine.  If you  find a minor scrape, cut, or break in the skin on your feet, keep it and the skin around it clean and dry. You may clean these areas with mild soap and water. Do not clean the area with peroxide, alcohol, or iodine.  If you have a wound, scrape, corn, or callus on your foot, look at it several times a day to make sure it is healing and not infected. Check for: ? Redness, swelling, or pain. ? Fluid or blood. ? Warmth. ? Pus or a bad smell. General instructions  Do not cross your legs. This may decrease blood flow to your feet.  Do not use heating pads or hot water bottles on your feet. They may burn your skin. If you have lost feeling in your feet or legs, you may not know this is happening until it is too late.  Protect your feet from hot and cold by wearing shoes, such as at the beach or on hot pavement.  Schedule a complete foot exam at least once a year (annually) or more often if you have foot problems. If you have foot problems, report any cuts, sores, or bruises to your health care provider immediately. Contact a health care provider if:  You have a medical condition that increases your risk of infection and you have any cuts, sores, or bruises on your feet.  You have an injury that is not   healing.  You have redness on your legs or feet.  You feel burning or tingling in your legs or feet.  You have pain or cramps in your legs and feet.  Your legs or feet are numb.  Your feet always feel cold.  You have pain around a toenail. Get help right away if:  You have a wound, scrape, corn, or callus on your foot and: ? You have pain, swelling, or redness that gets worse. ? You have fluid or blood coming from the wound, scrape, corn, or callus. ? Your wound, scrape, corn, or callus feels warm to the touch. ? You have pus or a bad smell coming from the wound, scrape, corn, or callus. ? You have a fever. ? You have a red line going up your leg. Summary  Check your feet every day  for cuts, sores, red spots, swelling, and blisters.  Moisturize feet and legs daily.  Wear shoes that fit properly and have enough cushioning.  If you have foot problems, report any cuts, sores, or bruises to your health care provider immediately.  Schedule a complete foot exam at least once a year (annually) or more often if you have foot problems. This information is not intended to replace advice given to you by your health care provider. Make sure you discuss any questions you have with your health care provider. Document Revised: 11/10/2018 Document Reviewed: 03/21/2016 Elsevier Patient Education  2020 [employer]  

## 2019-06-13 NOTE — Progress Notes (Signed)
Subjective: [name] Church presents today for follow up of preventative diabetic foot care and painful mycotic nails b/l that are difficult to trim. Pain interferes with ambulation. Aggravating factors include wearing enclosed shoe gear. Pain is relieved with periodic professional debridement.   Allergies  Allergen Reactions  . Sulfa Antibiotics   . Sulfamethoxazole Rash    Objective: Vitals:   06/10/19 1115  Temp: (!) 97.1 F (36.2 C)    Pt 85 y.o. year old Caucasian female  in NAD. AAO x 3.   Vascular Examination:  Capillary refill time to digits immediate b/l. Palpable DP pulses b/l. Palpable PT pulses b/l. Pedal hair sparse b/l. Skin temperature gradient within normal limits b/l.  Dermatological Examination: Pedal skin with normal turgor, texture and tone bilaterally. No open wounds bilaterally. No interdigital macerations bilaterally. Toenails 1-5 b/l elongated, dystrophic, thickened, crumbly with subungual debris and tenderness to dorsal palpation.  Musculoskeletal: Normal muscle strength 5/5 to all lower extremity muscle groups bilaterally, no pain crepitus or joint limitation noted with ROM b/l and pes planus deformity noted  Neurological: Protective sensation intact 5/5 intact bilaterally with 10g monofilament b/l Vibratory sensation intact b/l  Assessment: 1. Pain due to onychomycosis of toenails of both feet   2. Type 1 diabetes mellitus with stage 3 chronic kidney disease, unspecified whether stage 3a or 3b CKD ([place])    Plan: -Continue diabetic foot care principles. Literature dispensed on today.  -Toenails 1-5 b/l were debrided in length and girth with sterile nail nippers and dremel without iatrogenic bleeding.  -Patient to continue soft, supportive shoe gear daily. -Patient to report any pedal injuries to medical professional immediately. -Patient/POA to call should there be question/concern in the interim.  Return in about 3 months (around 09/09/2019).

## 2019-09-12 ENCOUNTER — Other Ambulatory Visit: Payer: Self-pay

## 2019-09-12 ENCOUNTER — Encounter: Payer: Self-pay | Admitting: Podiatry

## 2019-09-12 ENCOUNTER — Ambulatory Visit: Payer: Medicare Other | Admitting: Podiatry

## 2019-09-12 DIAGNOSIS — M79674 Pain in right toe(s): Secondary | ICD-10-CM

## 2019-09-12 DIAGNOSIS — L02612 Cutaneous abscess of left foot: Secondary | ICD-10-CM

## 2019-09-12 DIAGNOSIS — E1022 Type 1 diabetes mellitus with diabetic chronic kidney disease: Secondary | ICD-10-CM

## 2019-09-12 DIAGNOSIS — N183 Chronic kidney disease, stage 3 unspecified: Secondary | ICD-10-CM | POA: Diagnosis not present

## 2019-09-12 DIAGNOSIS — L03032 Cellulitis of left toe: Secondary | ICD-10-CM | POA: Diagnosis not present

## 2019-09-12 DIAGNOSIS — B351 Tinea unguium: Secondary | ICD-10-CM | POA: Diagnosis not present

## 2019-09-12 DIAGNOSIS — L600 Ingrowing nail: Secondary | ICD-10-CM | POA: Diagnosis not present

## 2019-09-12 DIAGNOSIS — M79675 Pain in left toe(s): Secondary | ICD-10-CM | POA: Diagnosis not present

## 2019-09-12 MED ORDER — AMOXICILLIN 500 MG PO CAPS: 500.0000 mg | ORAL_CAPSULE | Freq: Two times a day (BID) | ORAL | 0 refills | Status: DC

## 2019-09-16 NOTE — Progress Notes (Signed)
Subjective: [name] Church presents today at risk foot care. Pt has h/o NIDDM with chronic kidney disease and painful thick toenails that are difficult to trim. Pain interferes with ambulation. Aggravating factors include wearing enclosed shoe gear. Pain is relieved with periodic professional debridement.  [name] Jordan, MD is patient's PCP. She also sees [name] Church at [facility] for her diabetes.  Past Medical History:  Diagnosis Date  . Diabetes mellitus without complication ([place])   . Edema   . Elevated serum immunoglobulin free light chain level 11/29/2013  . Macrocytosis without anemia 11/29/2013  . Osteoporosis   . Thyroid disease   . Unspecified deficiency anemia 11/29/2013     Patient Active Problem List   Diagnosis Date Noted  . CKD (chronic kidney disease) stage 3, GFR 30-59 ml/min 10/08/2016  . Osteoporosis, postmenopausal 04/08/2016  . Primary hypothyroidism 04/08/2016  . Type 1 diabetes mellitus with hyperglycemia ([place]) 04/08/2016  . Type 1 diabetes mellitus with mild nonproliferative retinopathy of both eyes without macular edema ([facility]) 04/08/2016  . Elevated serum immunoglobulin free light chain level 11/29/2013  . Macrocytosis without anemia 11/29/2013  . Unspecified deficiency anemia 11/29/2013  . Edema     Current Outpatient Medications on File Prior to Visit  Medication Sig Dispense Refill  . ACCU-CHEK AVIVA PLUS test strip USE TO TEST BLOOD SUGARS QID  10  . ACCU-CHEK FASTCLIX LANCETS MISC     . aspirin 325 MG EC tablet Take 325 mg by mouth daily.    . BD PEN NEEDLE NANO U/F 32G X 4 MM MISC USE AS DIRECTED 4-5 TIMES A DAY  4  . ferrous sulfate 325 (65 FE) MG tablet Take 1 tablet once a day    . furosemide (LASIX) 20 MG tablet Take 20 mg by mouth.    Marland Kitchen HUMALOG KWIKPEN 100 UNIT/ML KiwkPen 2 (two) times daily.  5  . hydrochlorothiazide (MICROZIDE) 12.5 MG capsule Take 12.5 mg by mouth daily.    . Iron 66 MG TABS Take by mouth.    [employer]  ([employer] LANCING DEVICE) MISC     . levothyroxine (SYNTHROID, LEVOTHROID) 137 MCG tablet Take 137 mcg by mouth daily before breakfast.    . lisinopril (PRINIVIL,ZESTRIL) 5 MG tablet Take 5 mg by mouth daily.    Marland Kitchen loratadine (CLARITIN) 10 MG tablet Take 1 tablet once a day as needed    . Multiple Minerals-Vitamins (CALCIUM & VIT D3 BONE HEALTH PO) Take by mouth.    . Multiple Vitamin (MULTI-VITAMINS) TABS Take 1 tablet once a day    . Multiple Vitamins-Minerals (MULTIVITAMIN WITH MINERALS) tablet Take 1 tablet by mouth daily.    . TRESIBA FLEXTOUCH 100 UNIT/ML SOPN INJECT 20 UNITS QAM ONCE A DAY  4  . Vitamin D, Cholecalciferol, 1000 UNITS TABS Take by mouth.     No current facility-administered medications on file prior to visit.     Allergies  Allergen Reactions  . Sulfa Antibiotics   . Sulfamethoxazole Rash    Objective: [name] Church is a pleasant 85 y.o. Caucasian female in NAD. AAO x 3.  There were no vitals filed for this visit.  Vascular Examination: Capillary refill time to digits immediate b/l. Palpable pedal pulses b/l LE. Pedal hair sparse. Lower extremity skin temperature gradient within normal limits. No pain with calf compression b/l.  Dermatological Examination: Pedal skin with normal turgor, texture and tone bilaterally. No open wounds bilaterally. No interdigital macerations bilaterally. Toenails 1-5 b/l elongated, discolored, dystrophic,  thickened, crumbly with subungual debris and tenderness to dorsal palpation. Left hallux noted to be erythematous around medial lateral and proximal nail borders. There is evidence of subcutaneous dried drainage. No purulence expressed..  Musculoskeletal: Normal muscle strength 5/5 to all lower extremity muscle groups bilaterally. No pain crepitus or joint limitation noted with ROM b/l. Pes planus deformity noted b/l.   Neurological Examination: Protective sensation intact 5/5 intact bilaterally with 10g monofilament b/l. Vibratory  sensation intact b/l. Proprioception intact bilaterally. Clonus negative b/l.  Last A1c: No flowsheet data found.   Assessment: 1. Pain due to onychomycosis of toenails of both feet   2. Ingrown nail of great toe of left foot   3. Cellulitis and abscess of toe of left foot   4. Type 1 diabetes mellitus with stage 3 chronic kidney disease, unspecified whether stage 3a or 3b CKD (HCC)    Plan: -Examined patient. -Toenails 1-5 b/l were debrided in length and girth with sterile nail nippers and dremel without iatrogenic bleeding.  -Patient to report any pedal injuries to medical professional immediately. -Rx written for Amoxicillin 500 mg po bid x 7 days for cellulitis of left hallux. -Patient to continue soft, supportive shoe gear daily. -Patient/POA to call should there be question/concern in the interim.  Return in about 3 months (around 12/13/2019) for diabetic nail trim.  [name] Church, DPM

## 2019-12-16 ENCOUNTER — Other Ambulatory Visit: Payer: Self-pay

## 2019-12-16 ENCOUNTER — Encounter: Payer: Self-pay | Admitting: Podiatry

## 2019-12-16 ENCOUNTER — Ambulatory Visit: Payer: Medicare Other | Admitting: Podiatry

## 2019-12-16 DIAGNOSIS — M79675 Pain in left toe(s): Secondary | ICD-10-CM

## 2019-12-16 DIAGNOSIS — N183 Chronic kidney disease, stage 3 unspecified: Secondary | ICD-10-CM

## 2019-12-16 DIAGNOSIS — M2142 Flat foot [pes planus] (acquired), left foot: Secondary | ICD-10-CM

## 2019-12-16 DIAGNOSIS — B351 Tinea unguium: Secondary | ICD-10-CM

## 2019-12-16 DIAGNOSIS — M79674 Pain in right toe(s): Secondary | ICD-10-CM | POA: Diagnosis not present

## 2019-12-16 DIAGNOSIS — M2141 Flat foot [pes planus] (acquired), right foot: Secondary | ICD-10-CM

## 2019-12-16 DIAGNOSIS — E1022 Type 1 diabetes mellitus with diabetic chronic kidney disease: Secondary | ICD-10-CM

## 2019-12-20 NOTE — Progress Notes (Signed)
Subjective: [name] Church presents today at risk foot care. Pt has h/o NIDDM with chronic kidney disease and painful thick toenails that are difficult to trim. Pain interferes with ambulation. Aggravating factors include wearing enclosed shoe gear. Pain is relieved with periodic professional debridement.  [name] Jordan, MD is patient's PCP. She also sees Dr. Legrand Como Church at [facility] for her diabetes. Last visit with Dr. Elyse Church was 11/09/2019.  Past Medical History:  Diagnosis Date  . Diabetes mellitus without complication ([place])   . Edema   . Elevated serum immunoglobulin free light chain level 11/29/2013  . Macrocytosis without anemia 11/29/2013  . Osteoporosis   . Thyroid disease   . Unspecified deficiency anemia 11/29/2013     Patient Active Problem List   Diagnosis Date Noted  . CKD (chronic kidney disease) stage 3, GFR 30-59 ml/min (HCC) 10/08/2016  . Osteoporosis, postmenopausal 04/08/2016  . Primary hypothyroidism 04/08/2016  . Type 1 diabetes mellitus with hyperglycemia ([place]) 04/08/2016  . Type 1 diabetes mellitus with mild nonproliferative retinopathy of both eyes without macular edema (HCC) 04/08/2016  . Elevated serum immunoglobulin free light chain level 11/29/2013  . Macrocytosis without anemia 11/29/2013  . Unspecified deficiency anemia 11/29/2013  . Edema     Current Outpatient Medications on File Prior to Visit  Medication Sig Dispense Refill  . ACCU-CHEK AVIVA PLUS test strip USE TO TEST BLOOD SUGARS QID  10  . ACCU-CHEK FASTCLIX LANCETS MISC     . amoxicillin (AMOXIL) 500 MG capsule Take 1 capsule (500 mg total) by mouth 2 (two) times daily. 14 capsule 0  . aspirin 325 MG EC tablet Take 325 mg by mouth daily.    . BD PEN NEEDLE NANO U/F 32G X 4 MM MISC USE AS DIRECTED 4-5 TIMES A DAY  4  . ferrous sulfate 325 (65 FE) MG tablet Take 1 tablet once a day    . furosemide (LASIX) 20 MG tablet Take 20 mg by mouth.    Marland Kitchen HUMALOG KWIKPEN 100 UNIT/ML KiwkPen 2  (two) times daily.  5  . hydrochlorothiazide (MICROZIDE) 12.5 MG capsule Take 12.5 mg by mouth daily.    . Iron 66 MG TABS Take by mouth.    [employer] ([employer] LANCING DEVICE) MISC     . levothyroxine (SYNTHROID, LEVOTHROID) 137 MCG tablet Take 137 mcg by mouth daily before breakfast.    . lisinopril (PRINIVIL,ZESTRIL) 5 MG tablet Take 5 mg by mouth daily.    Marland Kitchen loratadine (CLARITIN) 10 MG tablet Take 1 tablet once a day as needed    . Multiple Minerals-Vitamins (CALCIUM & VIT D3 BONE HEALTH PO) Take by mouth.    . Multiple Vitamin (MULTI-VITAMINS) TABS Take 1 tablet once a day    . Multiple Vitamins-Minerals (MULTIVITAMIN WITH MINERALS) tablet Take 1 tablet by mouth daily.    . TRESIBA FLEXTOUCH 100 UNIT/ML SOPN INJECT 20 UNITS QAM ONCE A DAY  4  . Vitamin D, Cholecalciferol, 1000 UNITS TABS Take by mouth.     No current facility-administered medications on file prior to visit.     Allergies  Allergen Reactions  . Sulfa Antibiotics   . Sulfamethoxazole Rash    Objective: [name] Church is a pleasant 85 y.o. Caucasian female in NAD. AAO x 3.  There were no vitals filed for this visit.  Vascular Examination: Capillary refill time to digits immediate b/l. Palpable pedal pulses b/l LE. Pedal hair sparse. Lower extremity skin temperature gradient within normal limits. No pain  with calf compression b/l.  Dermatological Examination: Pedal skin with normal turgor, texture and tone bilaterally. No open wounds bilaterally. No interdigital macerations bilaterally. Toenails 1-5 b/l elongated, discolored, dystrophic, thickened, crumbly with subungual debris and tenderness to dorsal palpation. Left hallux with no signs of infection noted today.  Musculoskeletal: Normal muscle strength 5/5 to all lower extremity muscle groups bilaterally. No pain crepitus or joint limitation noted with ROM b/l. Pes planus deformity noted b/l.   Neurological Examination: Protective sensation intact 5/5  intact bilaterally with 10g monofilament b/l. Vibratory sensation intact b/l. Proprioception intact bilaterally. Clonus negative b/l.   Assessment: 1. Pain due to onychomycosis of toenails of both feet   2. Pes planus of both feet   3. Type 1 diabetes mellitus with stage 3 chronic kidney disease, unspecified whether stage 3a or 3b CKD (HCC)   4. Stage 3 chronic kidney disease, unspecified whether stage 3a or 3b CKD ([place])    Plan: -Examined patient. -Continue diabetic foot care principles. -Toenails 1-5 b/l were debrided in length and girth with sterile nail nippers and dremel without iatrogenic bleeding.  -Patient to report any pedal injuries to medical professional immediately. -Patient to continue soft, supportive shoe gear daily. -Patient/POA to call should there be question/concern in the interim.  Return in about 3 months (around 03/17/2020) for diabetic nail trim.  [name] Church, DPM

## 2020-02-21 ENCOUNTER — Ambulatory Visit: Payer: Medicare Other | Admitting: Podiatry

## 2020-02-21 ENCOUNTER — Encounter: Payer: Self-pay | Admitting: Podiatry

## 2020-02-21 ENCOUNTER — Other Ambulatory Visit: Payer: Self-pay

## 2020-02-21 DIAGNOSIS — E1022 Type 1 diabetes mellitus with diabetic chronic kidney disease: Secondary | ICD-10-CM

## 2020-02-21 DIAGNOSIS — L02612 Cutaneous abscess of left foot: Secondary | ICD-10-CM | POA: Diagnosis not present

## 2020-02-21 DIAGNOSIS — D692 Other nonthrombocytopenic purpura: Secondary | ICD-10-CM | POA: Insufficient documentation

## 2020-02-21 DIAGNOSIS — R2689 Other abnormalities of gait and mobility: Secondary | ICD-10-CM | POA: Insufficient documentation

## 2020-02-21 DIAGNOSIS — D7589 Other specified diseases of blood and blood-forming organs: Secondary | ICD-10-CM | POA: Insufficient documentation

## 2020-02-21 DIAGNOSIS — B351 Tinea unguium: Secondary | ICD-10-CM | POA: Diagnosis not present

## 2020-02-21 DIAGNOSIS — N183 Chronic kidney disease, stage 3 unspecified: Secondary | ICD-10-CM

## 2020-02-21 DIAGNOSIS — M79675 Pain in left toe(s): Secondary | ICD-10-CM

## 2020-02-21 DIAGNOSIS — M79674 Pain in right toe(s): Secondary | ICD-10-CM

## 2020-02-21 DIAGNOSIS — I872 Venous insufficiency (chronic) (peripheral): Secondary | ICD-10-CM | POA: Insufficient documentation

## 2020-02-21 DIAGNOSIS — L03032 Cellulitis of left toe: Secondary | ICD-10-CM | POA: Diagnosis not present

## 2020-02-21 DIAGNOSIS — I10 Essential (primary) hypertension: Secondary | ICD-10-CM | POA: Insufficient documentation

## 2020-02-21 DIAGNOSIS — M5136 Other intervertebral disc degeneration, lumbar region: Secondary | ICD-10-CM | POA: Insufficient documentation

## 2020-02-21 DIAGNOSIS — E1121 Type 2 diabetes mellitus with diabetic nephropathy: Secondary | ICD-10-CM | POA: Insufficient documentation

## 2020-02-21 DIAGNOSIS — Z6834 Body mass index (BMI) 34.0-34.9, adult: Secondary | ICD-10-CM | POA: Insufficient documentation

## 2020-02-21 DIAGNOSIS — L989 Disorder of the skin and subcutaneous tissue, unspecified: Secondary | ICD-10-CM | POA: Insufficient documentation

## 2020-02-21 DIAGNOSIS — M2141 Flat foot [pes planus] (acquired), right foot: Secondary | ICD-10-CM

## 2020-02-21 DIAGNOSIS — E113299 Type 2 diabetes mellitus with mild nonproliferative diabetic retinopathy without macular edema, unspecified eye: Secondary | ICD-10-CM | POA: Insufficient documentation

## 2020-02-21 DIAGNOSIS — M8440XA Pathological fracture, unspecified site, initial encounter for fracture: Secondary | ICD-10-CM | POA: Insufficient documentation

## 2020-02-21 DIAGNOSIS — L84 Corns and callosities: Secondary | ICD-10-CM | POA: Insufficient documentation

## 2020-02-21 DIAGNOSIS — J309 Allergic rhinitis, unspecified: Secondary | ICD-10-CM | POA: Insufficient documentation

## 2020-02-21 DIAGNOSIS — E559 Vitamin D deficiency, unspecified: Secondary | ICD-10-CM | POA: Insufficient documentation

## 2020-02-21 MED ORDER — MUPIROCIN CALCIUM 2 % EX CREA: TOPICAL_CREAM | CUTANEOUS | 0 refills | Status: DC

## 2020-02-21 MED ORDER — CEPHALEXIN 500 MG PO CAPS: 500.0000 mg | ORAL_CAPSULE | Freq: Three times a day (TID) | ORAL | 1 refills | Status: DC

## 2020-02-21 NOTE — Patient Instructions (Addendum)
Apply Mupirocin Ointment to left great toe once daily.   Monitor for any signs/symptoms of infection such as redness, swelling, odor, drainage, increased pain, or non-healing of digit.   Please do not hesitate to call the office and speak to a Nurse or Doctor if you have questions.   If you experience fever, chills, nightsweats, nausea or vomiting with worsening of digit, please go to the emergency room.

## 2020-02-22 NOTE — Progress Notes (Signed)
Subjective: [name] Church presents today at risk foot care. Pt has h/o NIDDM with chronic kidney disease and painful thick toenails that are difficult to trim. Pain interferes with ambulation. Aggravating factors include wearing enclosed shoe gear. Pain is relieved with periodic professional debridement.  [name] Jordan, MD is patient's PCP. She also sees Dr. Legrand Como Church at [facility] for her diabetes. Last visit with Dr. Elyse Church was 02/17/2020.  Today, she is concerned about her left hallux. She has had cellulitis of this digit in July.  She relates it is red again. She denies any drainage or pain.   Past Medical History:  Diagnosis Date  . Diabetes mellitus without complication ([place])   . Edema   . Elevated serum immunoglobulin free light chain level 11/29/2013  . Macrocytosis without anemia 11/29/2013  . Osteoporosis   . Thyroid disease   . Unspecified deficiency anemia 11/29/2013     Patient Active Problem List   Diagnosis Date Noted  . Allergic rhinitis 02/21/2020  . Body mass index (BMI) 34.0-34.9, adult 02/21/2020  . Callosity 02/21/2020  . Diabetic renal disease ([place]) 02/21/2020  . Disorder of the skin and subcutaneous tissue, unspecified 02/21/2020  . Essential hypertension 02/21/2020  . Impairment of balance 02/21/2020  . Macrocytosis 02/21/2020  . Onychomycosis 02/21/2020  . Other intervertebral disc degeneration, lumbar region 02/21/2020  . Pathological fracture 02/21/2020  . Vitamin D deficiency 02/21/2020  . Background diabetic retinopathy ([place]) 02/21/2020  . Senile purpura ([place]) 02/21/2020  . Stasis dermatitis 02/21/2020  . CKD (chronic kidney disease) stage 3, GFR 30-59 ml/min (HCC) 10/08/2016  . Osteoporosis, postmenopausal 04/08/2016  . Primary hypothyroidism 04/08/2016  . Type 1 diabetes mellitus with hyperglycemia ([place]) 04/08/2016  . Type 1 diabetes mellitus with mild nonproliferative retinopathy of both eyes without macular edema (HCC) 04/08/2016   . Elevated serum immunoglobulin free light chain level 11/29/2013  . Macrocytosis without anemia 11/29/2013  . Unspecified deficiency anemia 11/29/2013  . Edema     Current Outpatient Medications on File Prior to Visit  Medication Sig Dispense Refill  . ACCU-CHEK AVIVA PLUS test strip USE TO TEST BLOOD SUGARS QID  10  . ACCU-CHEK FASTCLIX LANCETS MISC     . aspirin 325 MG EC tablet Take 325 mg by mouth daily.    . BD PEN NEEDLE NANO U/F 32G X 4 MM MISC USE AS DIRECTED 4-5 TIMES A DAY  4  . ferrous sulfate 325 (65 FE) MG tablet Take 1 tablet once a day    . furosemide (LASIX) 20 MG tablet Take 20 mg by mouth.    Marland Kitchen HUMALOG KWIKPEN 100 UNIT/ML KiwkPen 2 (two) times daily.  5  . hydrochlorothiazide (MICROZIDE) 12.5 MG capsule Take 12.5 mg by mouth daily.    . Iron 66 MG TABS Take by mouth.    Elmore Guise [employer] ([employer] LANCING DEVICE) MISC     . latanoprost (XALATAN) 0.005 % ophthalmic solution SMARTSIG:In Eye(s)    . levothyroxine (SYNTHROID, LEVOTHROID) 137 MCG tablet Take 137 mcg by mouth daily before breakfast.    . lisinopril (PRINIVIL,ZESTRIL) 5 MG tablet Take 5 mg by mouth daily.    Marland Kitchen loratadine (CLARITIN) 10 MG tablet Take 1 tablet once a day as needed    . Multiple Minerals-Vitamins (CALCIUM & VIT D3 BONE HEALTH PO) Take by mouth.    . Multiple Vitamin (MULTI-VITAMINS) TABS Take 1 tablet once a day    . Multiple Vitamins-Minerals (MULTIVITAMIN WITH MINERALS) tablet Take 1 tablet by mouth daily.    Marland Kitchen  Oyster Shell Calcium 500 MG TABS 1 tablet with meals    . TRESIBA FLEXTOUCH 100 UNIT/ML SOPN INJECT 20 UNITS QAM ONCE A DAY  4  . Vitamin D, Cholecalciferol, 1000 UNITS TABS Take by mouth.     No current facility-administered medications on file prior to visit.     Allergies  Allergen Reactions  . Sulfa Antibiotics   . Sulfamethoxazole Rash    Objective: [name] Church is a pleasant 85 y.o. Caucasian female in NAD. AAO x 3.  There were no vitals filed for this  visit.  Vascular Examination: Capillary refill time to digits immediate b/l. Palpable pedal pulses b/l LE. Pedal hair sparse. Lower extremity skin temperature gradient within normal limits. No pain with calf compression b/l.  Dermatological Examination: Pedal skin with normal turgor, texture and tone bilaterally. No open wounds bilaterally. No interdigital macerations bilaterally. Toenails 1-5 b/l elongated, discolored, dystrophic, thickened, crumbly with subungual debris and tenderness to dorsal palpation.   Left hallux is erythematous on medial border and nail is loose at distal 2/3. Nailbed remains intact. There is malodor I suspect from moisture. No purulence noted. No edema of digit. No pain on palpation.  Musculoskeletal: Normal muscle strength 5/5 to all lower extremity muscle groups bilaterally. No pain crepitus or joint limitation noted with ROM b/l. Pes planus deformity noted b/l.   Neurological Examination: Protective sensation intact 5/5 intact bilaterally with 10g monofilament b/l. Vibratory sensation intact b/l. Proprioception intact bilaterally. Clonus negative b/l.   Assessment: 1. Cellulitis and abscess of toe of left foot   2. Pain due to onychomycosis of toenails of both feet   3. Pes planus of both feet   4. Type 1 diabetes mellitus with stage 3 chronic kidney disease, unspecified whether stage 3a or 3b CKD ([place])    Plan: -Examined patient. -Discussed need for nail avulsion to allow time for skin around digit to heal. She does not want to soak her foot for the Christmas Holiday. Trimmed loose toenail to level of adherence. Patient given written instructions for care of left hallux. -Rx sent to pharmacy for Keflex 500 mg po tid for 7 days. -Rx sent to pharmacy for Mupirocin Ointment to be applied to left hallux once daily. -Continue diabetic foot care principles. -Toenails 1-5 b/l were debrided in length and girth with sterile nail nippers and dremel without iatrogenic  bleeding.  -Patient to report any pedal injuries to medical professional immediately. -Patient to continue soft, supportive shoe gear daily. -Patient/POA to call should there be question/concern in the interim.  Return in about 1 week (around 02/28/2020) for left great toe check and possible temporary total nail avulsion.  Marzetta Board, DPM

## 2020-02-29 ENCOUNTER — Encounter: Payer: Self-pay | Admitting: Podiatry

## 2020-02-29 ENCOUNTER — Ambulatory Visit: Payer: Medicare Other | Admitting: Podiatry

## 2020-02-29 ENCOUNTER — Other Ambulatory Visit: Payer: Self-pay

## 2020-02-29 DIAGNOSIS — L02612 Cutaneous abscess of left foot: Secondary | ICD-10-CM

## 2020-02-29 DIAGNOSIS — L03032 Cellulitis of left toe: Secondary | ICD-10-CM

## 2020-03-02 NOTE — Progress Notes (Signed)
Subjective: [name] Church presents today follow up of cellulitis of left hallux. She states she has one antibiotic capsule left. She has been applying the Mupirocin Ointment daily.  Jonathon Jordan, MD is patient's PCP. She also sees Dr. Legrand Como [place]imer at [facility] for her diabetes. Last visit with Dr. Elyse Hsu was 02/17/2020.  Past Medical History:  Diagnosis Date  . Diabetes mellitus without complication ([place])   . Edema   . Elevated serum immunoglobulin free light chain level 11/29/2013  . Macrocytosis without anemia 11/29/2013  . Osteoporosis   . Thyroid disease   . Unspecified deficiency anemia 11/29/2013     Patient Active Problem List   Diagnosis Date Noted  . Allergic rhinitis 02/21/2020  . Body mass index (BMI) 34.0-34.9, adult 02/21/2020  . Callosity 02/21/2020  . Diabetic renal disease ([place]) 02/21/2020  . Disorder of the skin and subcutaneous tissue, unspecified 02/21/2020  . Essential hypertension 02/21/2020  . Impairment of balance 02/21/2020  . Macrocytosis 02/21/2020  . Onychomycosis 02/21/2020  . Other intervertebral disc degeneration, lumbar region 02/21/2020  . Pathological fracture 02/21/2020  . Vitamin D deficiency 02/21/2020  . Background diabetic retinopathy ([place]) 02/21/2020  . Senile purpura ([place]) 02/21/2020  . Stasis dermatitis 02/21/2020  . CKD (chronic kidney disease) stage 3, GFR 30-59 ml/min (HCC) 10/08/2016  . Osteoporosis, postmenopausal 04/08/2016  . Primary hypothyroidism 04/08/2016  . Type 1 diabetes mellitus with hyperglycemia ([place]) 04/08/2016  . Type 1 diabetes mellitus with mild nonproliferative retinopathy of both eyes without macular edema (HCC) 04/08/2016  . Elevated serum immunoglobulin free light chain level 11/29/2013  . Macrocytosis without anemia 11/29/2013  . Unspecified deficiency anemia 11/29/2013  . Edema     Current Outpatient Medications on File Prior to Visit  Medication Sig Dispense Refill  . ACCU-CHEK AVIVA PLUS  test strip USE TO TEST BLOOD SUGARS QID  10  . ACCU-CHEK FASTCLIX LANCETS MISC     . aspirin 325 MG EC tablet Take 325 mg by mouth daily.    . BD PEN NEEDLE NANO U/F 32G X 4 MM MISC USE AS DIRECTED 4-5 TIMES A DAY  4  . cephALEXin (KEFLEX) 500 MG capsule Take 1 capsule (500 mg total) by mouth 3 (three) times daily. 21 capsule 1  . ferrous sulfate 325 (65 FE) MG tablet Take 1 tablet once a day    . furosemide (LASIX) 20 MG tablet Take 20 mg by mouth.    Marland Kitchen HUMALOG KWIKPEN 100 UNIT/ML KiwkPen 2 (two) times daily.  5  . hydrochlorothiazide (MICROZIDE) 12.5 MG capsule Take 12.5 mg by mouth daily.    . Iron 66 MG TABS Take by mouth.    Elmore Gu[employer] ([employer] LANCING DEVICE) MISC     . latanoprost (XALATAN) 0.005 % ophthalmic solution SMARTSIG:In Eye(s)    . levothyroxine (SYNTHROID, LEVOTHROID) 137 MCG tablet Take 137 mcg by mouth daily before breakfast.    . lisinopril (PRINIVIL,ZESTRIL) 5 MG tablet Take 5 mg by mouth daily.    Marland Kitchen loratadine (CLARITIN) 10 MG tablet Take 1 tablet once a day as needed    . Multiple Minerals-Vitamins (CALCIUM & VIT D3 BONE HEALTH PO) Take by mouth.    . Multiple Vitamin (MULTI-VITAMINS) TABS Take 1 tablet once a day    . Multiple Vitamins-Minerals (MULTIVITAMIN WITH MINERALS) tablet Take 1 tablet by mouth daily.    . mupirocin cream (BACTROBAN) 2 % Apply to left great toe once daily. 30 g 0  . Oyster Shell Calcium 500 MG  TABS 1 tablet with meals    . TRESIBA FLEXTOUCH 100 UNIT/ML SOPN INJECT 20 UNITS QAM ONCE A DAY  4  . Vitamin D, Cholecalciferol, 1000 UNITS TABS Take by mouth.     No current facility-administered medications on file prior to visit.     Allergies  Allergen Reactions  . Sulfa Antibiotics   . Sulfamethoxazole Rash    Objective: [name] Church is a pleasant 85 y.o. Caucasian female in NAD. AAO x 3.  There were no vitals filed for this visit.  Vascular Examination: Capillary refill time to digits immediate b/l. Palpable pedal pulses b/l LE.  Pedal hair sparse. Lower extremity skin temperature gradient within normal limits. No pain with calf compression b/l.  Dermatological Examination: Pedal skin with normal turgor, texture and tone bilaterally. No open wounds bilaterally. No interdigital macerations bilaterally. Toenails recently debrided.   Left hallux cellulitis resolved. Remaining toenail adheredt. Malodor resolved. No purulence, no erythema, no edema.   Musculoskeletal: Normal muscle strength 5/5 to all lower extremity muscle groups bilaterally. No pain crepitus or joint limitation noted with ROM b/l. Pes planus deformity noted b/l.   Neurological Examination: Protective sensation intact 5/5 intact bilaterally with 10g monofilament b/l. Vibratory sensation intact b/l. Proprioception intact bilaterally. Clonus negative b/l.   Assessment: 1. Cellulitis and abscess of toe of left foot    Plan: -Examined patient. -Cleansed nailbed left hallux. She may let digit get some air.  -Resume normal daily activity. -Patient to report any pedal injuries to medical professional immediately. -Patient to continue soft, supportive shoe gear daily. -Patient/POA to call should there be question/concern in the interim.  Return in about 9 weeks (around 05/02/2020) for Nail care.  Freddie Breech, DPM

## 2020-03-23 ENCOUNTER — Ambulatory Visit: Payer: Medicare Other | Admitting: Podiatry

## 2020-04-27 DIAGNOSIS — Z79899 Other long term (current) drug therapy: Secondary | ICD-10-CM | POA: Diagnosis not present

## 2020-04-27 DIAGNOSIS — M8000XA Age-related osteoporosis with current pathological fracture, unspecified site, initial encounter for fracture: Secondary | ICD-10-CM | POA: Diagnosis not present

## 2020-05-01 DIAGNOSIS — I10 Essential (primary) hypertension: Secondary | ICD-10-CM | POA: Diagnosis not present

## 2020-05-01 DIAGNOSIS — D692 Other nonthrombocytopenic purpura: Secondary | ICD-10-CM | POA: Diagnosis not present

## 2020-05-01 DIAGNOSIS — Z0000 Encounter for general adult medical examination without abnormal findings: Secondary | ICD-10-CM | POA: Diagnosis not present

## 2020-05-01 DIAGNOSIS — D7589 Other specified diseases of blood and blood-forming organs: Secondary | ICD-10-CM | POA: Diagnosis not present

## 2020-05-01 DIAGNOSIS — E113599 Type 2 diabetes mellitus with proliferative diabetic retinopathy without macular edema, unspecified eye: Secondary | ICD-10-CM | POA: Diagnosis not present

## 2020-05-01 DIAGNOSIS — Z794 Long term (current) use of insulin: Secondary | ICD-10-CM | POA: Diagnosis not present

## 2020-05-01 DIAGNOSIS — N183 Chronic kidney disease, stage 3 unspecified: Secondary | ICD-10-CM | POA: Diagnosis not present

## 2020-05-01 DIAGNOSIS — E039 Hypothyroidism, unspecified: Secondary | ICD-10-CM | POA: Diagnosis not present

## 2020-05-01 DIAGNOSIS — I499 Cardiac arrhythmia, unspecified: Secondary | ICD-10-CM | POA: Diagnosis not present

## 2020-05-03 ENCOUNTER — Telehealth: Payer: Self-pay

## 2020-05-03 NOTE — Telephone Encounter (Signed)
NOTES ON FILE FROM [facility] [phone], SENT REFERRAL TO SCHEDULING 

## 2020-05-08 ENCOUNTER — Other Ambulatory Visit: Payer: Self-pay

## 2020-05-08 ENCOUNTER — Ambulatory Visit: Payer: Medicare Other | Admitting: Podiatry

## 2020-05-08 DIAGNOSIS — M79674 Pain in right toe(s): Secondary | ICD-10-CM | POA: Diagnosis not present

## 2020-05-08 DIAGNOSIS — E1022 Type 1 diabetes mellitus with diabetic chronic kidney disease: Secondary | ICD-10-CM

## 2020-05-08 DIAGNOSIS — N183 Chronic kidney disease, stage 3 unspecified: Secondary | ICD-10-CM

## 2020-05-08 DIAGNOSIS — B351 Tinea unguium: Secondary | ICD-10-CM

## 2020-05-08 DIAGNOSIS — M79675 Pain in left toe(s): Secondary | ICD-10-CM | POA: Diagnosis not present

## 2020-05-08 DIAGNOSIS — M2141 Flat foot [pes planus] (acquired), right foot: Secondary | ICD-10-CM

## 2020-05-08 DIAGNOSIS — M2142 Flat foot [pes planus] (acquired), left foot: Secondary | ICD-10-CM

## 2020-05-13 ENCOUNTER — Encounter: Payer: Self-pay | Admitting: Podiatry

## 2020-05-13 NOTE — Progress Notes (Signed)
Subjective: [name] Church presents today at risk foot care. Pt has h/o NIDDM with chronic kidney disease and painful thick toenails that are difficult to trim. Pain interferes with ambulation. Aggravating factors include wearing enclosed shoe gear. Pain is relieved with periodic professional debridement.. She states she has one antibiotic capsule left. She has been applying the Mupirocin Ointment daily.  Jonathon Jordan, MD is patient's PCP. She also sees Dr. Legrand Como Altheimer at [facility] for her diabetes. Last visit with Dr. Elyse Hsu was 02/17/2020.  Allergies  Allergen Reactions  . Sulfa Antibiotics   . Sulfamethoxazole Rash   Objective: [name] Church is a pleasant 86 y.o. Caucasian female in NAD. AAO x 3.  There were no vitals filed for this visit.  Vascular Examination: Capillary refill time to digits immediate b/l. Palpable pedal pulses b/l LE. Pedal hair sparse. Lower extremity skin temperature gradient within normal limits. No pain with calf compression b/l.  Dermatological Examination: Pedal skin with normal turgor, texture and tone bilaterally. No open wounds bilaterally. No interdigital macerations bilaterally. Toenails 1-5 b/l discolored, elongated with subungual debris with tenderness to dorsal palpation.  Musculoskeletal: Normal muscle strength 5/5 to all lower extremity muscle groups bilaterally. No pain crepitus or joint limitation noted with ROM b/l. Pes planus deformity noted b/l. Elevatus of left hallux.  Neurological Examination: Protective sensation intact 5/5 intact bilaterally with 10g monofilament b/l. Vibratory sensation intact b/l. Proprioception intact bilaterally. Clonus negative b/l.   Assessment: 1. Pain due to onychomycosis of toenails of both feet   2. Pes planus of both feet   3. Type 1 diabetes mellitus with stage 3 chronic kidney disease, unspecified whether stage 3a or 3b CKD ([place])    Plan: -Examined patient. Continue diabetic foot care  principles. -Toenails 1-5 b/l  debrided in length and girth without iatrogenic laceration. -Patient to report any pedal injuries to medical professional immediately. -Patient to continue soft, supportive shoe gear daily. -Patient/POA to call should there be question/concern in the interim.  Return in about 3 months (around 08/08/2020).  Marzetta Board, DPM

## 2020-05-22 DIAGNOSIS — E039 Hypothyroidism, unspecified: Secondary | ICD-10-CM | POA: Diagnosis not present

## 2020-05-22 DIAGNOSIS — N1832 Chronic kidney disease, stage 3b: Secondary | ICD-10-CM | POA: Diagnosis not present

## 2020-05-22 DIAGNOSIS — E1065 Type 1 diabetes mellitus with hyperglycemia: Secondary | ICD-10-CM | POA: Diagnosis not present

## 2020-05-24 ENCOUNTER — Other Ambulatory Visit: Payer: Self-pay

## 2020-05-24 ENCOUNTER — Encounter: Payer: Self-pay | Admitting: Cardiology

## 2020-05-24 ENCOUNTER — Encounter: Payer: Self-pay | Admitting: *Deleted

## 2020-05-24 ENCOUNTER — Ambulatory Visit: Payer: Medicare Other | Admitting: Cardiology

## 2020-05-24 ENCOUNTER — Ambulatory Visit (INDEPENDENT_AMBULATORY_CARE_PROVIDER_SITE_OTHER): Payer: Medicare Other

## 2020-05-24 ENCOUNTER — Encounter (INDEPENDENT_AMBULATORY_CARE_PROVIDER_SITE_OTHER): Payer: Self-pay

## 2020-05-24 VITALS — BP 136/62 | HR 93 | Ht 63.5 in | Wt 194.8 lb

## 2020-05-24 DIAGNOSIS — I10 Essential (primary) hypertension: Secondary | ICD-10-CM

## 2020-05-24 DIAGNOSIS — E7800 Pure hypercholesterolemia, unspecified: Secondary | ICD-10-CM

## 2020-05-24 DIAGNOSIS — I493 Ventricular premature depolarization: Secondary | ICD-10-CM

## 2020-05-24 NOTE — Addendum Note (Signed)
Addended by: Antonieta Iba on: 05/24/2020 01:06 PM   Modules accepted: Orders

## 2020-05-24 NOTE — Progress Notes (Signed)
Cardiology Consult Note    Date:  05/24/2020   ID:  [name] Church, DOB 10/05/1934, MRN [id number]  PCP:  Jonathon Jordan, MD  Cardiologist:  Fransico Him, MD   Chief Complaint  Patient presents with  . New Patient (Initial Visit)    PVCs    History of Present Illness:  [name] Church is a 86 y.o. female who is being seen today for the evaluation of irregular heart beat at the request of Jonathon Jordan, MD.  This is an 86yo female with a hx of DM on Insulin, HTN, CKD stage 3, thyroid disease and edema.  She was recently seen by her PCP and was found to have bigeminal PVCs on EKG.    She recently went in for a wellness visit and was found to have bigeminal PVCs.  She says that she has never had any problems with palpitations or skipped heart beats in the past.  She denies any chest pain or pressure, SOB, DOE (but she is sedentary), PND, orthopnea, dizziness or syncope. She broke her leg and ankle in the past and has had chronic LLE edema since then.  She is compliant with her meds and is tolerating meds with no SE.    Past Medical History:  Diagnosis Date  . Diabetes mellitus without complication ([place])   . Edema   . Elevated serum immunoglobulin free light chain level 11/29/2013  . Macrocytosis without anemia 11/29/2013  . Osteoporosis   . Thyroid disease   . Unspecified deficiency anemia 11/29/2013    Past Surgical History:  Procedure Laterality Date  . APPENDECTOMY    . EYE SURGERY Left 03/2013   eye bleeds internally  . EYE SURGERY Right 03/2013  . FRACTURE SURGERY Left 2012   leg with metal fixation    Current Medications: Current Meds  Medication Sig  . ACCU-CHEK AVIVA PLUS test strip USE TO TEST BLOOD SUGARS QID  . ACCU-CHEK FASTCLIX LANCETS MISC   . aspirin 325 MG EC tablet Take 325 mg by mouth daily.  . BD PEN NEEDLE NANO U/F 32G X 4 MM MISC USE AS DIRECTED 4-5 TIMES A DAY  . cephALEXin (KEFLEX) 500 MG capsule Take 1 capsule (500 mg total) by mouth 3 (three)  times daily.  . ferrous sulfate 325 (65 FE) MG tablet Take 1 tablet once a day  . furosemide (LASIX) 20 MG tablet Take 20 mg by mouth.  Marland Kitchen HUMALOG KWIKPEN 100 UNIT/ML KiwkPen 2 (two) times daily.  . hydrochlorothiazide (MICROZIDE) 12.5 MG capsule Take 12.5 mg by mouth daily.  . Iron 66 MG TABS Take by mouth.  Elmore Gu[employer] ([employer] LANCING DEVICE) MISC   . latanoprost (XALATAN) 0.005 % ophthalmic solution SMARTSIG:In Eye(s)  . levothyroxine (SYNTHROID, LEVOTHROID) 137 MCG tablet Take 137 mcg by mouth daily before breakfast.  . lisinopril (ZESTRIL) 5 MG tablet Take 1 tablet by mouth daily.  Marland Kitchen loratadine (CLARITIN) 10 MG tablet Take 1 tablet once a day as needed  . Multiple Minerals-Vitamins (CALCIUM & VIT D3 BONE HEALTH PO) Take by mouth.  . Multiple Vitamin (MULTI-VITAMINS) TABS Take 1 tablet once a day  . Multiple Vitamins-Minerals (MULTIVITAMIN WITH MINERALS) tablet Take 1 tablet by mouth daily.  . mupirocin cream (BACTROBAN) 2 % Apply to left great toe once daily.  [place] Calcium 500 MG TABS 1 tablet with meals  . TRESIBA FLEXTOUCH 200 UNIT/ML FlexTouch Pen SMARTSIG:14 Unit(s) SUB-Q Every Morning  . Vitamin D, Cholecalciferol, 1000 UNITS TABS Take  by mouth.    Allergies:   Sulfa antibiotics and Sulfamethoxazole   Social History   Socioeconomic History  . Marital status: Married    Spouse name: Not on file  . Number of children: Not on file  . Years of education: Not on file  . Highest education level: Not on file  Occupational History  . Not on file  Tobacco Use  . Smoking status: Never Smoker  . Smokeless tobacco: Never Used  Substance and Sexual Activity  . Alcohol use: No  . Drug use: No  . Sexual activity: Not on file  Other Topics Concern  . Not on file  Social History Narrative  . Not on file   Social Determinants of Health   Financial Resource Strain: Not on file  Food Insecurity: Not on file  Transportation Needs: Not on file  Physical Activity: Not  on file  Stress: Not on file  Social Connections: Not on file     Family History:  The patient's family history includes Cancer in her brother, brother, and mother.   ROS:   Please see the history of present illness.    ROS All other systems reviewed and are negative.  No flowsheet data found.  PHYSICAL EXAM:   VS:  BP 136/62   Pulse 93   Ht 5' 3.5" (1.613 m)   Wt 194 lb 12.8 oz (88.4 kg)   SpO2 98%   BMI 33.97 kg/m    GEN: Well nourished, well developed, in no acute distress  HEENT: normal  Neck: no JVD, carotid bruits, or masses Cardiac: RRR; no murmurs, rubs, or gallops,no edema.  Intact distal pulses bilaterally.  Respiratory:  clear to auscultation bilaterally, normal work of breathing GI: soft, nontender, nondistended, + BS MS: no deformity or atrophy  Skin: warm and dry, no rash Neuro:  Alert and Oriented x 3, Strength and sensation are intact Psych: euthymic mood, full affect  Wt Readings from Last 3 Encounters:  05/24/20 194 lb 12.8 oz (88.4 kg)  05/30/14 186 lb 14.4 oz (84.8 kg)  03/01/14 192 lb 1.6 oz (87.1 kg)      Studies/Labs Reviewed:   EKG:  EKG isordered today and showed NSR with bigeminal PVCs Recent Labs: No results found for requested labs within last 8760 hours.   Lipid Panel No results found for: CHOL, TRIG, HDL, CHOLHDL, VLDL, LDLCALC, LDLDIRECT  Additional studies/ records that were reviewed today include:  OV notes and EKG from PCP    ASSESSMENT:    1. PVC (premature ventricular contraction)   2. Essential hypertension   3. Pure hypercholesterolemia      PLAN:  In order of problems listed above:  PVCs -noted on EKG with a bigeminal pattern at times -check Mag  -TSH was normal at 2.21 and K+ 4.5 on 3/22/202 -check 2D echo to assess LVF -will get a 2 week ziopatch to assess PVC load and rule out other ventricular arrhythmias -she does have CRFs including HTN, HLD and DM insulin requiring and may have silent ischemia -check  Lexiscan myoview to rule out ischemia -Shared Decision Making/Informed Consent The risks [chest pain, shortness of breath, cardiac arrhythmias, dizziness, blood pressure fluctuations, myocardial infarction, stroke/transient ischemic attack, nausea, vomiting, allergic reaction, radiation exposure, metallic taste sensation and life-threatening complications (estimated to be 1 in 10,000)], benefits (risk stratification, diagnosing coronary artery disease, treatment guidance) and alternatives of a nuclear stress test were discussed in detail with Ms. [facility] and she agrees to proceed.  HTN -  BP controlled on exam -continue Lisinopril 5mg  daily  HLD -followup by PCP -she has refused statin therapy   Medication Adjustments/Labs and Tests Ordered: Current medicines are reviewed at length with the patient today.  Concerns regarding medicines are outlined above.  Medication changes, Labs and Tests ordered today are listed in the Patient Instructions below.  There are no Patient Instructions on file for this visit.   Signed, Fransico Him, MD  05/24/2020 12:54 PM    [address], [place], [place]  [address] Phone: [phone]; Fax: [phone]

## 2020-05-24 NOTE — Addendum Note (Signed)
Addended by: Sueanne Margarita on: 05/24/2020 01:49 PM   Modules accepted: Orders

## 2020-05-24 NOTE — Progress Notes (Signed)
Patient ID: [name] Church, female   DOB: 1934-09-19, 86 y.o.   MRN: [id number] Patient enrolled for [employer] to ship a 14 day ZIO XT long term holter monitor to her home.

## 2020-05-24 NOTE — Patient Instructions (Signed)
Medication Instructions:  Your physician recommends that you continue on your current medications as directed. Please refer to the Current Medication list given to you today.  *If you need a refill on your cardiac medications before your next appointment, please call your pharmacy*  Lab Work: TODAY: Magnesium If you have labs (blood work) drawn today and your tests are completely normal, you will receive your results only by: Marland Kitchen MyChart Message (if you have MyChart) OR . A paper copy in the mail If you have any lab test that is abnormal or we need to change your treatment, we will call you to review the results.   Testing/Procedures: Your physician has requested that you have a lexiscan myoview. For further information please visit [email or web address]. Please follow instruction sheet, as given.  Your physician has requested that you have an echocardiogram. Echocardiography is a painless test that uses sound waves to create images of your heart. It provides your doctor with information about the size and shape of your heart and how well your heart's chambers and valves are working. This procedure takes approximately one hour. There are no restrictions for this procedure.  Your physician has recommended that you wear an event monitor. Event monitors are medical devices that record the heart's electrical activity. Doctors most often [place] these monitors to diagnose arrhythmias. Arrhythmias are problems with the speed or rhythm of the heartbeat. The monitor is a small, portable device. You can wear one while you do your normal daily activities. This is usually used to diagnose what is causing palpitations/syncope (passing out).  Follow-Up: At [facility], you and your health needs are our priority.  As part of our continuing mission to provide you with exceptional heart care, we have created designated Provider Care Teams.  These Care Teams include your primary Cardiologist (physician) and Advanced  Practice Providers (APPs -  Physician Assistants and Nurse Practitioners) who all work together to provide you with the care you need, when you need it.  Follow up with Dr. Radford Pax as needed based on results of testing   Other Instructions ZIO XT- Long Term Monitor Instructions   Your physician has requested you wear your ZIO patch monitor_14_days.   This is a single patch monitor.  [employer] supplies one patch monitor per enrollment.  Additional stickers are not available.   Please do not apply patch if you will be having a Nuclear Stress Test, Echocardiogram, Cardiac CT, MRI, or Chest Xray during the time frame you would be wearing the monitor. The patch cannot be worn during these tests.  You cannot remove and re-apply the ZIO XT patch monitor.   Your ZIO patch monitor will be sent USPS Priority mail from [facility] directly to your home address. The monitor may also be mailed to a PO BOX if home delivery is not available.   It may take 3-5 days to receive your monitor after you have been enrolled.   Once you have received you monitor, please review enclosed instructions.  Your monitor has already been registered assigning a specific monitor serial # to you.   Applying the monitor   Shave hair from upper left chest.   Hold abrader disc by orange tab.  Rub abrader in 40 strokes over left upper chest as indicated in your monitor instructions.   Clean area with 4 enclosed alcohol pads .  Use all pads to assure are is cleaned thoroughly.  Let dry.   Apply patch as indicated in monitor instructions.  Patch will  be place under collarbone on left side of chest with arrow pointing upward.   Rub patch adhesive wings for 2 minutes.Remove white label marked "1".  Remove white label marked "2".  Rub patch adhesive wings for 2 additional minutes.   While looking in a mirror, press and release button in center of patch.  A small green light will flash 3-4 times .  This will be your only  indicator the monitor has been turned on.     Do not shower for the first 24 hours.  You may shower after the first 24 hours.   Press button if you feel a symptom. You will hear a small click.  Record Date, Time and Symptom in the Patient Log Book.   When you are ready to remove patch, follow instructions on last 2 pages of Patient Log Book.  Stick patch monitor onto last page of Patient Log Book.   Place Patient Log Book in [place] box.  Use locking tab on box and tape box closed securely.  The [employer] and [employer] has [employer] on it.  Please place in mailbox as soon as possible.  Your physician should have your test results approximately 7 days after the monitor has been mailed back to [facility].   Call [place] at [phone] if you have questions regarding your ZIO XT patch monitor.  Call them immediately if you see an orange light blinking on your monitor.   If your monitor falls off in less than 4 days contact our Monitor department at [phone].  If your monitor becomes loose or falls off after 4 days call [employer] at [phone] for suggestions on securing your monitor.

## 2020-05-25 DIAGNOSIS — E1065 Type 1 diabetes mellitus with hyperglycemia: Secondary | ICD-10-CM | POA: Diagnosis not present

## 2020-05-25 DIAGNOSIS — Z794 Long term (current) use of insulin: Secondary | ICD-10-CM | POA: Diagnosis not present

## 2020-05-25 DIAGNOSIS — E1022 Type 1 diabetes mellitus with diabetic chronic kidney disease: Secondary | ICD-10-CM | POA: Diagnosis not present

## 2020-05-25 DIAGNOSIS — N1832 Chronic kidney disease, stage 3b: Secondary | ICD-10-CM | POA: Diagnosis not present

## 2020-05-25 DIAGNOSIS — E039 Hypothyroidism, unspecified: Secondary | ICD-10-CM | POA: Diagnosis not present

## 2020-05-25 DIAGNOSIS — E103293 Type 1 diabetes mellitus with mild nonproliferative diabetic retinopathy without macular edema, bilateral: Secondary | ICD-10-CM | POA: Diagnosis not present

## 2020-05-25 DIAGNOSIS — M810 Age-related osteoporosis without current pathological fracture: Secondary | ICD-10-CM | POA: Diagnosis not present

## 2020-05-25 LAB — MAGNESIUM: Magnesium: 2.1 mg/dL (ref 1.6–2.3)

## 2020-05-30 DIAGNOSIS — I493 Ventricular premature depolarization: Secondary | ICD-10-CM

## 2020-06-15 DIAGNOSIS — I493 Ventricular premature depolarization: Secondary | ICD-10-CM | POA: Diagnosis not present

## 2020-06-16 ENCOUNTER — Encounter: Payer: Self-pay | Admitting: Cardiology

## 2020-06-20 ENCOUNTER — Telehealth: Payer: Self-pay

## 2020-06-20 DIAGNOSIS — I472 Ventricular tachycardia, unspecified: Secondary | ICD-10-CM

## 2020-06-20 MED ORDER — METOPROLOL SUCCINATE ER 25 MG PO TB24: 25.0000 mg | ORAL_TABLET | Freq: Every day | ORAL | 3 refills | Status: DC

## 2020-06-20 NOTE — Telephone Encounter (Signed)
The patient has been notified of the result and verbalized understanding.  All questions (if any) were answered. Antonieta Iba, RN 06/20/2020 11:59 AM  Referral has been placed and Rx has been sent in.

## 2020-06-20 NOTE — Telephone Encounter (Signed)
-----   Message from Sueanne Margarita, MD sent at 06/16/2020  6:50 PM EDT ----- Heart monitor showed significant runs of a fast heart beat from the bottom of her heart called ventricular tachycardia.  Also with fast heart beat from the top of the heart called atrial tachycardia.  The VT is more concerning and she has significant PVCs.  Stress test and echo are pending.  Please refer to Dr. Quentin Ore with EP ASAP.  Start Toprol XL 25mg  daily .  Please see if we can get Lexiscan myoivew and echo moved up to this week.  Also have her come in for TSH.

## 2020-06-25 ENCOUNTER — Telehealth (HOSPITAL_COMMUNITY): Payer: Self-pay

## 2020-06-25 NOTE — Telephone Encounter (Signed)
Spoke with the patient, she stated that she understood and would be here for her test. Asked to call back with any questions. [name] EMTP 

## 2020-06-26 ENCOUNTER — Encounter (INDEPENDENT_AMBULATORY_CARE_PROVIDER_SITE_OTHER): Payer: Self-pay

## 2020-06-26 ENCOUNTER — Ambulatory Visit (HOSPITAL_COMMUNITY): Payer: Medicare Other | Attending: Cardiology

## 2020-06-26 ENCOUNTER — Other Ambulatory Visit: Payer: Self-pay

## 2020-06-26 ENCOUNTER — Ambulatory Visit (HOSPITAL_BASED_OUTPATIENT_CLINIC_OR_DEPARTMENT_OTHER): Payer: Medicare Other

## 2020-06-26 DIAGNOSIS — I493 Ventricular premature depolarization: Secondary | ICD-10-CM

## 2020-06-26 DIAGNOSIS — I340 Nonrheumatic mitral (valve) insufficiency: Secondary | ICD-10-CM | POA: Diagnosis not present

## 2020-06-26 DIAGNOSIS — I361 Nonrheumatic tricuspid (valve) insufficiency: Secondary | ICD-10-CM

## 2020-06-26 LAB — ECHOCARDIOGRAM COMPLETE
Area-P 1/2: 4.21 cm2
Height: 63.5 in
P 1/2 time: 289 msec
S' Lateral: 2.5 cm
Weight: 3104 oz

## 2020-06-26 LAB — MYOCARDIAL PERFUSION IMAGING
LV dias vol: 55 mL (ref 46–106)
LV sys vol: 12 mL
Peak HR: 102 {beats}/min
Rest HR: 89 {beats}/min
SDS: 0
SRS: 0
SSS: 0
TID: 1.13

## 2020-06-26 MED ORDER — TECHNETIUM TC 99M TETROFOSMIN IV KIT
31.7000 | PACK | Freq: Once | INTRAVENOUS | Status: AC | PRN
  Administered 2020-06-26: 31.7 via INTRAVENOUS
  Filled 2020-06-26: qty 32

## 2020-06-26 MED ORDER — PERFLUTREN LIPID MICROSPHERE
1.0000 mL | INTRAVENOUS | Status: AC | PRN
  Administered 2020-06-26: 3 mL via INTRAVENOUS

## 2020-06-26 MED ORDER — TECHNETIUM TC 99M TETROFOSMIN IV KIT
10.1000 | PACK | Freq: Once | INTRAVENOUS | Status: AC | PRN
Start: 2020-06-26 — End: 2020-06-26
  Administered 2020-06-26: 10.1 via INTRAVENOUS
  Filled 2020-06-26: qty 11

## 2020-06-26 MED ORDER — REGADENOSON 0.4 MG/5ML IV SOLN
0.4000 mg | Freq: Once | INTRAVENOUS | Status: AC
  Administered 2020-06-26: 0.4 mg via INTRAVENOUS

## 2020-06-27 ENCOUNTER — Telehealth: Payer: Self-pay | Admitting: Cardiology

## 2020-06-27 ENCOUNTER — Telehealth: Payer: Self-pay

## 2020-06-27 DIAGNOSIS — I272 Pulmonary hypertension, unspecified: Secondary | ICD-10-CM

## 2020-06-27 NOTE — Telephone Encounter (Signed)
The patient has been notified of the result and verbalized understanding.  All questions (if any) were answered. [name] Iba, RN 06/27/2020 4:42 PM  Orders have been placed. Patient is aware that schedulers will reach out to her to set up appointments for testing.

## 2020-06-27 NOTE — Telephone Encounter (Signed)
See above phone note.  

## 2020-06-27 NOTE — Telephone Encounter (Signed)
-----   Message from Sueanne Margarita, MD sent at 06/26/2020  2:49 PM EDT ----- Normal LVF with mild pulmonary HTN and mild MR and mildly increased pressures in the LV.  Please get VQ scan to rule out PE, PFTs with DLCO and PSG to rule out sleep apnea

## 2020-06-27 NOTE — Telephone Encounter (Signed)
Follow Up:      Pt is returning Carlyle's call, concerning her Echo results.

## 2020-06-28 ENCOUNTER — Telehealth: Payer: Self-pay | Admitting: *Deleted

## 2020-06-28 NOTE — Telephone Encounter (Signed)
Prior Authorization for NSPG  sent to [facility] via web portal.  No PA is required. Decision [id number].

## 2020-06-28 NOTE — Telephone Encounter (Signed)
-----   Message from Carlyle Overbey, RN sent at 06/27/2020  4:42 PM EDT ----- PSG sleep study has been ordered.  Thanks!  

## 2020-06-28 NOTE — Telephone Encounter (Signed)
-----   Message from Antonieta Iba, RN sent at 06/27/2020  4:42 PM EDT ----- PSG sleep study has been ordered.  Thanks!

## 2020-07-04 ENCOUNTER — Encounter: Payer: Self-pay | Admitting: Internal Medicine

## 2020-07-04 ENCOUNTER — Other Ambulatory Visit: Payer: Self-pay

## 2020-07-04 ENCOUNTER — Ambulatory Visit: Payer: Medicare Other | Admitting: Internal Medicine

## 2020-07-04 DIAGNOSIS — I493 Ventricular premature depolarization: Secondary | ICD-10-CM | POA: Diagnosis not present

## 2020-07-04 MED ORDER — MEXILETINE HCL 150 MG PO CAPS: 150.0000 mg | ORAL_CAPSULE | Freq: Two times a day (BID) | ORAL | 11 refills | Status: DC

## 2020-07-04 NOTE — Patient Instructions (Addendum)
Medication Instructions:  Your physician has recommended you make the following change in your medication:   1.  START taking mexiletine 150 mg- Take one tablet by mouth twice a day.  Labwork: None ordered.  Testing/Procedures: None ordered.  Follow-Up:  Nurse visit for EKG in 2 weeks- please schedule  Your physician wants you to follow-up in: 3 months with Cristopher Peru, MD    Any Other Special Instructions Will Be Listed Below (If Applicable).  If you need a refill on your cardiac medications before your next appointment, please call your pharmacy.   Mexiletine capsules What is this medicine? MEXILETINE (mex IL e teen) is an antiarrhythmic agent. This medicine is used to treat irregular heart rhythm and can slow rapid heartbeats. It can help your heart to return to and maintain a normal rhythm. Because of the side effects caused by this medicine, it is usually used for heartbeat problems that may be life-threatening. This medicine may be used for other purposes; ask your health care provider or pharmacist if you have questions. COMMON BRAND NAME(S): Mexitil What should I tell my health care provider before I take this medicine? They need to know if you have any of these conditions:  liver disease  other heart problems  previous heart attack  an unusual or allergic reaction to mexiletine, other medicines, foods, dyes, or preservatives  pregnant or trying to get pregnant  breast-feeding How should I use this medicine? Take this medicine by mouth with a glass of water. Follow the directions on the prescription label. It is recommended that you take this medicine with food or an antacid. Take your doses at regular intervals. Do not take your medicine more often than directed. Do not stop taking except on the advice of your doctor or health care professional. Talk to your pediatrician regarding the use of this medicine in children. Special care may be needed. Overdosage: If  you think you have taken too much of this medicine contact a poison control center or emergency room at once. NOTE: This medicine is only for you. Do not share this medicine with others. What if I miss a dose? If you miss a dose, take it as soon as you can. If it is almost time for your next dose, take only that dose. Do not take double or extra doses. What may interact with this medicine? Do not take this medicine with any of the following medications:  dofetilide This medicine may also interact with the following medications:  caffeine  cimetidine  medicines for depression, anxiety, or psychotic disturbances  medicines to control heart rhythm  phenobarbital  phenytoin  rifampin  theophylline This list may not describe all possible interactions. Give your health care provider a list of all the medicines, herbs, non-prescription drugs, or dietary supplements you use. Also tell them if you smoke, drink alcohol, or use illegal drugs. Some items may interact with your medicine. What should I watch for while using this medicine? Your condition will be monitored closely when you first begin therapy. Often, this drug is first started in a hospital or other monitored health care setting. Once you are on maintenance therapy, visit your doctor or health care provider for regular checks on your progress. Because your condition and use of this medicine carry some risk, it is a good idea to carry an identification card, necklace or bracelet with details of your condition, medications, and doctor or health care provider. You may get drowsy or dizzy. Do not drive, use  machinery, or do anything that needs mental alertness until you know how this medicine affects you. Do not stand or sit up quickly, especially if you are an older patient. This reduces the risk of dizzy or fainting spells. Alcohol can make you more dizzy, increase flushing and rapid heartbeats. Avoid alcoholic drinks. This medicine may  cause serious skin reactions. They can happen weeks to months after starting the medicine. Contact your health care provider right away if you notice fevers or flu-like symptoms with a rash. The rash may be red or purple and then turn into blisters or peeling of the skin. Or, you might notice a red rash with swelling of the face, lips or lymph nodes in your neck or under your arms. What side effects may I notice from receiving this medicine? Side effects that you should report to your doctor or health care professional as soon as possible:  allergic reactions like skin rash, itching or hives, swelling of the face, lips, or tongue  breathing problems  chest pain, continued irregular heartbeats  rash, fever, and swollen lymph nodes  redness, blistering, peeling or loosening of the skin, including inside the mouth  seizures  skin rash  trembling, shaking  unusual bleeding or bruising  unusually weak or tired Side effects that usually do not require medical attention (report to your doctor or health care professional if they continue or are bothersome):  blurred vision  difficulty walking  heartburn  nausea, vomiting  nervousness  numbness, or tingling in the fingers or toes This list may not describe all possible side effects. Call your doctor for medical advice about side effects. You may report side effects to FDA at 1-800-FDA-1088. Where should I keep my medicine? Keep out of reach of children. Store at room temperature between 15 and 30 degrees C (59 and 86 degrees F). Throw away any unused medicine after the expiration date. NOTE: This sheet is a summary. It may not cover all possible information. If you have questions about this medicine, talk to your doctor, pharmacist, or health care provider.  2021 Elsevier/Gold Standard (2018-05-26 09:25:42)

## 2020-07-04 NOTE — Progress Notes (Signed)
HPI Ms. Zabawa is referred by Dr. Radford Pax for evaluation of PVC's. She is a pleasant 86 yo woman with a h/o HTN, obesity and dyslipidemia who was found to have bigeminal PVC's and a cardiac monitor obtained demonstrated NSVT and 30% PVC's. She has normal LV function and a low risk perfusion scan. She notes minimal palpitations when she lies down at night but otherwise only fatigue. She has not had syncope. No chest pain or sob.  She was placed on low dose toprol.  Allergies  Allergen Reactions  . Sulfa Antibiotics   . Sulfamethoxazole Rash     Current Outpatient Medications  Medication Sig Dispense Refill  . ACCU-CHEK AVIVA PLUS test strip USE TO TEST BLOOD SUGARS QID  10  . ACCU-CHEK FASTCLIX LANCETS MISC     . aspirin 325 MG EC tablet Take 325 mg by mouth daily.    . BD PEN NEEDLE NANO U/F 32G X 4 MM MISC USE AS DIRECTED 4-5 TIMES A DAY  4  . ferrous sulfate 325 (65 FE) MG tablet Take 1 tablet once a day    . furosemide (LASIX) 20 MG tablet Take 20 mg by mouth.    Marland Kitchen HUMALOG KWIKPEN 100 UNIT/ML KiwkPen 2 (two) times daily.  5  . hydrochlorothiazide (MICROZIDE) 12.5 MG capsule Take 12.5 mg by mouth daily.    . Iron 66 MG TABS Take by mouth.    Elmore Guise Devices ([employer] LANCING DEVICE) MISC     . latanoprost (XALATAN) 0.005 % ophthalmic solution SMARTSIG:In Eye(s)    . levothyroxine (SYNTHROID, LEVOTHROID) 137 MCG tablet Take 137 mcg by mouth daily before breakfast.    . lisinopril (ZESTRIL) 5 MG tablet Take 1 tablet by mouth daily.    Marland Kitchen loratadine (CLARITIN) 10 MG tablet Take 1 tablet once a day as needed    . metoprolol succinate (TOPROL XL) 25 MG 24 hr tablet Take 1 tablet (25 mg total) by mouth daily. 90 tablet 3  . Multiple Minerals-Vitamins (CALCIUM & VIT D3 BONE HEALTH PO) Take by mouth.    . Multiple Vitamin (MULTI-VITAMINS) TABS Take 1 tablet once a day    . Multiple Vitamins-Minerals (MULTIVITAMIN WITH MINERALS) tablet Take 1 tablet by mouth daily.    . mupirocin cream  (BACTROBAN) 2 % Apply to left great toe once daily. 30 g 0  . Oyster Shell Calcium 500 MG TABS 1 tablet with meals    . TRESIBA FLEXTOUCH 200 UNIT/ML FlexTouch Pen SMARTSIG:14 Unit(s) SUB-Q Every Morning    . Vitamin D, Cholecalciferol, 1000 UNITS TABS Take by mouth.     No current facility-administered medications for this visit.     Past Medical History:  Diagnosis Date  . Diabetes mellitus without complication ([place])   . Edema   . Elevated serum immunoglobulin free light chain level 11/29/2013  . Macrocytosis without anemia 11/29/2013  . Nonsustained ventricular tachycardia ([facility])    115 episodes of NSVT on mointor up to 6 beats in a row with maximum HR of 197bpm on heart monitor 06/2020  . Osteoporosis   . PAT (paroxysmal atrial tachycardia) ([facility])    nonsustained atrial tachycardia up to 23 seconds at a time on heart monitor 06/2020  . Thyroid disease   . Unspecified deficiency anemia 11/29/2013    ROS:   All systems reviewed and negative except as noted in the HPI.   Past Surgical History:  Procedure Laterality Date  . APPENDECTOMY    . EYE SURGERY  Left 03/2013   eye bleeds internally  . EYE SURGERY Right 03/2013  . FRACTURE SURGERY Left 2012   leg with metal fixation     Family History  Problem Relation Age of Onset  . Cancer Mother        colon ca  . Cancer Brother        prostate ca  . Cancer Brother        lung ca     Social History   Socioeconomic History  . Marital status: Married    Spouse name: Not on file  . Number of children: Not on file  . Years of education: Not on file  . Highest education level: Not on file  Occupational History  . Not on file  Tobacco Use  . Smoking status: Never Smoker  . Smokeless tobacco: Never Used  Substance and Sexual Activity  . Alcohol use: No  . Drug use: No  . Sexual activity: Not on file  Other Topics Concern  . Not on file  Social History Narrative  . Not on file   Social Determinants of Health    Financial Resource Strain: Not on file  Food Insecurity: Not on file  Transportation Needs: Not on file  Physical Activity: Not on file  Stress: Not on file  Social Connections: Not on file  Intimate Partner Violence: Not on file     BP 132/68   Pulse (!) 42   Ht 5' 3.5" (1.613 m)   Wt 194 lb 6.4 oz (88.2 kg)   SpO2 97%   BMI 33.90 kg/m   Physical Exam:  Morbidly obese appearing NAD HEENT: Unremarkable Neck:  No JVD, no thyromegally Lymphatics:  No adenopathy Back:  No CVA tenderness Lungs:  Clear HEART:  Regular rate rhythm, no murmurs, no rubs, no clicks Abd:  soft, positive bowel sounds, no organomegally, no rebound, no guarding Ext:  2 plus pulses, no edema, no cyanosis, no clubbing Skin:  No rashes no nodules Neuro:  CN II through XII intact, motor grossly intact  EKG - nsr with bigeminy  2D echo, cardiac monitor and perfusion scan reviewed  Assess/Plan: 1. PVC' s - we discussed the treatment options in detail. I have reviewed medical therapy and catheter ablation. Her PVC's are coming from high in the LV out flow tract or perhaps the aortic root. We will start mextil. I might consider flecainide.  2. Obesity - she is encouraged to lose weight.   Carleene Overlie Trino Higinbotham,MD

## 2020-07-05 ENCOUNTER — Encounter (HOSPITAL_COMMUNITY): Payer: Medicare Other

## 2020-07-06 DIAGNOSIS — H401133 Primary open-angle glaucoma, bilateral, severe stage: Secondary | ICD-10-CM | POA: Diagnosis not present

## 2020-07-12 ENCOUNTER — Ambulatory Visit (HOSPITAL_COMMUNITY)
Admission: RE | Admit: 2020-07-12 | Discharge: 2020-07-12 | Disposition: A | Discharge status: Home / Self Care | Payer: Medicare Other | Source: Ambulatory Visit | Attending: Cardiology | Admitting: Cardiology

## 2020-07-12 ENCOUNTER — Other Ambulatory Visit: Payer: Self-pay

## 2020-07-12 ENCOUNTER — Encounter (HOSPITAL_COMMUNITY)
Admission: RE | Admit: 2020-07-12 | Discharge: 2020-07-12 | Disposition: A | Discharge status: Home / Self Care | Payer: Medicare Other | Source: Ambulatory Visit | Attending: Cardiology | Admitting: Cardiology

## 2020-07-12 DIAGNOSIS — I272 Pulmonary hypertension, unspecified: Secondary | ICD-10-CM | POA: Insufficient documentation

## 2020-07-12 DIAGNOSIS — I270 Primary pulmonary hypertension: Secondary | ICD-10-CM | POA: Diagnosis not present

## 2020-07-12 DIAGNOSIS — I10 Essential (primary) hypertension: Secondary | ICD-10-CM | POA: Diagnosis not present

## 2020-07-12 MED ORDER — TECHNETIUM TO 99M ALBUMIN AGGREGATED
4.3000 | Freq: Once | INTRAVENOUS | Status: AC | PRN
  Administered 2020-07-12: 4.3 via INTRAVENOUS

## 2020-07-18 ENCOUNTER — Other Ambulatory Visit: Payer: Self-pay

## 2020-07-18 ENCOUNTER — Ambulatory Visit (INDEPENDENT_AMBULATORY_CARE_PROVIDER_SITE_OTHER): Payer: Medicare Other

## 2020-07-18 VITALS — BP 146/82 | HR 63 | Resp 16 | Ht 63.0 in | Wt 192.0 lb

## 2020-07-18 DIAGNOSIS — I493 Ventricular premature depolarization: Secondary | ICD-10-CM

## 2020-07-18 DIAGNOSIS — I472 Ventricular tachycardia, unspecified: Secondary | ICD-10-CM

## 2020-07-18 NOTE — Patient Instructions (Signed)
Medication Instructions:  Stop Mexilitine  Labwork: None  Testing/Procedures: None  Follow-Up:  Please call Dr. Tanna Furry nurse Sonia Baller on Monday May 23 to discuss your balance. At that time, Dr Lovena Le may consider putting you on a new medication for your heart palpitations.   Any Other Special Instructions Will Be Listed Below (If Applicable).     If you need a refill on your cardiac medications before your next appointment, please call your pharmacy.

## 2020-07-18 NOTE — Progress Notes (Signed)
1.) Reason for visit: New start Mexilitine  2.) Name of MD requesting visit: Lovena Le  3.) H&P: Pt presents today for an EKG for a new start Mexiletine. She has a hx of bigeminy PVC's with palpitations.   4.) ROS related to problem: Since beginning mexiletine, she has had increased issues with balance including a fall last week. Her fall was related to balance. She continues to feel some palpitations.   5.) Assessment and plan per MD: EKG was NSR with HR of 62. Per DOD, Dr. Caryl Comes, pt is to stop Mexiletine due to her balance and recent fall. She was instructed to call the office on Monday to report if her balance has improved or remained the same. At that time, Dr. Lovena Le may want to begin Flecainide per his OV note on 5/04

## 2020-07-19 NOTE — Addendum Note (Signed)
Addended by: Loren Racer on: 07/19/2020 04:03 PM   Modules accepted: Orders

## 2020-07-20 DIAGNOSIS — H401133 Primary open-angle glaucoma, bilateral, severe stage: Secondary | ICD-10-CM | POA: Diagnosis not present

## 2020-07-23 ENCOUNTER — Telehealth: Payer: Self-pay | Admitting: Internal Medicine

## 2020-07-23 MED ORDER — FLECAINIDE ACETATE 50 MG PO TABS: 50.0000 mg | ORAL_TABLET | Freq: Two times a day (BID) | ORAL | 11 refills | Status: DC

## 2020-07-23 NOTE — Telephone Encounter (Signed)
Pt states she was in our office on  07/18/20 and was asked to stop taking the medication Mexilitine because of balance issures and was told to callback today to give an update of her progress, pt states she is doing a lot better. Please advise

## 2020-07-23 NOTE — Telephone Encounter (Signed)
Patient is scheduled for lab study on 09-11-20. Patient understands her sleep study will be done at [facility] sleep lab. Patient understands she will receive a sleep packet in a week or so. Patient understands to call if she does not receive the sleep packet in a timely manner. Patient agrees with treatment and thanked me for call.

## 2020-07-23 NOTE — Telephone Encounter (Signed)
Call returned to Pt.  Per Pt she is feeling back to normal after stopping mexiletine.  States she fell when taking mexiletine d/t balance issues.  Per Dr. Raiford Noble start flecainide 50 mg PO BID.  Will have Pt come in for nurse visit on August 02, 2020 at 11:00 am  Advised Pt to call office if she has recurrent balance issues on new medicine.

## 2020-08-02 ENCOUNTER — Other Ambulatory Visit: Payer: Self-pay

## 2020-08-02 ENCOUNTER — Ambulatory Visit (INDEPENDENT_AMBULATORY_CARE_PROVIDER_SITE_OTHER): Payer: Medicare Other

## 2020-08-02 VITALS — HR 67 | Ht 63.5 in

## 2020-08-02 DIAGNOSIS — I493 Ventricular premature depolarization: Secondary | ICD-10-CM | POA: Diagnosis not present

## 2020-08-02 NOTE — Patient Instructions (Signed)
Continue flecainide 50 mg PO BID  Follow up as scheduled

## 2020-08-02 NOTE — Progress Notes (Signed)
1.) Reason for visit: EKG for new start flecainide  2.) Name of MD requesting visit: Dr. Lovena Le  3.) H&P: Pt with PVC's.  Started on flecainide 50 mg PO BID after Pt did not tolerate mexiletine.  4.) ROS related to problem: Pt states she is doing much better on the flecainide.  States her palpitations are better.    5.) Assessment and plan per MD: EKG reviewed by Dr. Lovena Le.  OK to continue flecainide 50 mg PO BID and follow up as scheduled.

## 2020-08-20 DIAGNOSIS — E1065 Type 1 diabetes mellitus with hyperglycemia: Secondary | ICD-10-CM | POA: Diagnosis not present

## 2020-08-20 DIAGNOSIS — N1832 Chronic kidney disease, stage 3b: Secondary | ICD-10-CM | POA: Diagnosis not present

## 2020-08-21 ENCOUNTER — Encounter: Payer: Self-pay | Admitting: Podiatry

## 2020-08-21 ENCOUNTER — Other Ambulatory Visit: Payer: Self-pay

## 2020-08-21 ENCOUNTER — Ambulatory Visit: Payer: Medicare Other | Admitting: Podiatry

## 2020-08-21 DIAGNOSIS — M79674 Pain in right toe(s): Secondary | ICD-10-CM

## 2020-08-21 DIAGNOSIS — M79675 Pain in left toe(s): Secondary | ICD-10-CM | POA: Diagnosis not present

## 2020-08-21 DIAGNOSIS — N183 Chronic kidney disease, stage 3 unspecified: Secondary | ICD-10-CM | POA: Diagnosis not present

## 2020-08-21 DIAGNOSIS — E1022 Type 1 diabetes mellitus with diabetic chronic kidney disease: Secondary | ICD-10-CM | POA: Diagnosis not present

## 2020-08-21 DIAGNOSIS — B351 Tinea unguium: Secondary | ICD-10-CM | POA: Diagnosis not present

## 2020-08-21 DIAGNOSIS — I499 Cardiac arrhythmia, unspecified: Secondary | ICD-10-CM | POA: Insufficient documentation

## 2020-08-21 DIAGNOSIS — L858 Other specified epidermal thickening: Secondary | ICD-10-CM | POA: Diagnosis not present

## 2020-08-22 DIAGNOSIS — E039 Hypothyroidism, unspecified: Secondary | ICD-10-CM | POA: Diagnosis not present

## 2020-08-22 DIAGNOSIS — Z794 Long term (current) use of insulin: Secondary | ICD-10-CM | POA: Diagnosis not present

## 2020-08-22 DIAGNOSIS — E103293 Type 1 diabetes mellitus with mild nonproliferative diabetic retinopathy without macular edema, bilateral: Secondary | ICD-10-CM | POA: Diagnosis not present

## 2020-08-22 DIAGNOSIS — E1022 Type 1 diabetes mellitus with diabetic chronic kidney disease: Secondary | ICD-10-CM | POA: Diagnosis not present

## 2020-08-22 DIAGNOSIS — N1832 Chronic kidney disease, stage 3b: Secondary | ICD-10-CM | POA: Diagnosis not present

## 2020-08-22 DIAGNOSIS — E1065 Type 1 diabetes mellitus with hyperglycemia: Secondary | ICD-10-CM | POA: Diagnosis not present

## 2020-08-22 DIAGNOSIS — M810 Age-related osteoporosis without current pathological fracture: Secondary | ICD-10-CM | POA: Diagnosis not present

## 2020-08-24 DIAGNOSIS — R2689 Other abnormalities of gait and mobility: Secondary | ICD-10-CM | POA: Diagnosis not present

## 2020-08-24 DIAGNOSIS — I493 Ventricular premature depolarization: Secondary | ICD-10-CM | POA: Diagnosis not present

## 2020-08-25 NOTE — Progress Notes (Signed)
  Subjective:  Patient ID: [name] Church, female    DOB: Dec 13, 1934,  MRN: [id number]  86 y.o. female presents at risk foot care. Pt has h/o NIDDM with chronic kidney disease and thick, elongated toenails b/l lower extremities which are tender when wearing enclosed shoe gear.  Patient states she does not remember her blood glucose reading today.  She states she has a painful lesion on the bottom of her left foot. States it gets caught on things and is painful.   PCP is Jonathon Jordan, MD , and last visit was 05/01/2020.  Allergies  Allergen Reactions   Mexiletine     Other reaction(s): Other (See Comments) Off balance    Sulfa Antibiotics    Sulfamethoxazole Rash    Review of Systems: Negative except as noted in the HPI.   Objective:   Constitutional Pt is a pleasant 86 y.o. Caucasian female in NAD. AAO x 3.   Vascular Capillary refill time to digits immediate b/l. Palpable DP pulse(s) b/l lower extremities Palpable PT pulse(s) b/l lower extremities Pedal hair sparse. Lower extremity skin temperature gradient within normal limits. No pain with calf compression b/l.  Neurologic Protective sensation intact 5/5 intact bilaterally with 10g monofilament b/l.  Dermatologic Pedal skin with normal turgor, texture and tone b/l lower extremities No open wounds b/l lower extremities No interdigital macerations b/l lower extremities Toenails 1-5 b/l elongated, discolored, dystrophic, thickened, crumbly with subungual debris and tenderness to dorsal palpation. Small thin cutaneous horn noted mid plantar left midfoot.  Orthopedic: Normal muscle strength 5/5 to all lower extremity muscle groups bilaterally. Elevatus of left hallux. No pain crepitus or joint limitation noted with ROM b/l. Pes planus deformity noted b/l.    Radiographs: None Assessment:   1. Pain due to onychomycosis of toenails of both feet   2. Cutaneous horn   3. Type 1 diabetes mellitus with stage 3 chronic kidney disease,  unspecified whether stage 3a or 3b CKD ([place])    Plan:  Patient was evaluated and treated and all questions answered.  Onychomycosis with pain -Nails palliatively debridement as below. -Educated on self-care  Procedure: Nail Debridement Rationale: Pain Type of Debridement: manual, sharp debridement. Instrumentation: Nail nipper, rotary burr. Number of Nails: 10  -Examined patient. -Small cutaneous horn plantar aspect left foot filed gently with dremel. -Patient to continue soft, supportive shoe gear daily. -Toenails 1-5 b/l were debrided in length and girth with sterile nail nippers and dremel without iatrogenic bleeding.  -Patient to report any pedal injuries to medical professional immediately. -Patient/POA to call should there be question/concern in the interim.  Return in about 3 months (around 11/21/2020).  Marzetta Board, DPM

## 2020-09-11 ENCOUNTER — Encounter (HOSPITAL_BASED_OUTPATIENT_CLINIC_OR_DEPARTMENT_OTHER): Payer: Medicare Other | Admitting: Cardiology

## 2020-09-14 ENCOUNTER — Ambulatory Visit (HOSPITAL_BASED_OUTPATIENT_CLINIC_OR_DEPARTMENT_OTHER): Payer: Medicare Other | Attending: Family Medicine | Admitting: Physical Therapy

## 2020-09-14 ENCOUNTER — Other Ambulatory Visit: Payer: Self-pay

## 2020-09-14 ENCOUNTER — Encounter (HOSPITAL_BASED_OUTPATIENT_CLINIC_OR_DEPARTMENT_OTHER): Payer: Self-pay | Admitting: Physical Therapy

## 2020-09-14 DIAGNOSIS — R2681 Unsteadiness on feet: Secondary | ICD-10-CM | POA: Diagnosis not present

## 2020-09-14 DIAGNOSIS — R262 Difficulty in walking, not elsewhere classified: Secondary | ICD-10-CM | POA: Diagnosis not present

## 2020-09-14 DIAGNOSIS — M6281 Muscle weakness (generalized): Secondary | ICD-10-CM | POA: Insufficient documentation

## 2020-09-14 NOTE — Patient Instructions (Signed)
Access Code: [email or web address] URL: [email or web address] Date: 09/14/2020 Prepared by: Daleen Bo  Exercises Sit to Stand Without Arm Support - 2 x daily - 7 x weekly - 3 sets - 5 reps Heel toe Rocking - 1 x daily - 7 x weekly - 2 sets - 10 reps

## 2020-09-17 NOTE — Therapy (Signed)
[facility] [address] [place], [place], [address] Phone: [phone]   Fax:  [phone]  Physical Therapy Evaluation  Patient Details  Name: [name] Church MRN: [id number] Date of Birth: 1935/02/28 Referring Provider (PT): Jonathon Jordan, MD   Encounter Date: 09/14/2020    Past Medical History:  Diagnosis Date   Diabetes mellitus without complication ([place])    Edema    Elevated serum immunoglobulin free light chain level 11/29/2013   Macrocytosis without anemia 11/29/2013   Nonsustained ventricular tachycardia (HCC)    115 episodes of NSVT on mointor up to 6 beats in a row with maximum HR of 197bpm on heart monitor 06/2020   Osteoporosis    PAT (paroxysmal atrial tachycardia) (HCC)    nonsustained atrial tachycardia up to 23 seconds at a time on heart monitor 06/2020   Thyroid disease    Unspecified deficiency anemia 11/29/2013    Past Surgical History:  Procedure Laterality Date   APPENDECTOMY     EYE SURGERY Left 03/2013   eye bleeds internally   EYE SURGERY Right 03/2013   FRACTURE SURGERY Left 2012   leg with metal fixation    There were no vitals filed for this visit.        [facility] PT Assessment - 09/17/20 0001       Assessment   Medical Diagnosis R26.89 (ICD-10-CM) - Other abnormalities of gait and mobility    Referring Provider (PT) Jonathon Jordan, MD    Prior Therapy N/A      Precautions   Precautions None      Restrictions   Weight Bearing Restrictions No      Balance Screen   Has the patient fallen in the past 6 months Yes    How many times? 1    Has the patient had a decrease in activity level because of a fear of falling?  Yes    Is the patient reluctant to leave their home because of a fear of falling?  No      Home Environment   Living Environment Private residence    Living Arrangements Alone    Type of Encampment One level      Prior Function   Level of  Independence Independent    Leisure TV reruns      Cognition   Overall Cognitive Status Within Functional Limits for tasks assessed      Coordination   Gross Motor Movements are Fluid and Coordinated Yes      Functional Tests   Functional tests Sit to Stand      Sit to Stand   Comments 5XSTS 28.2      Posture/Postural Control   Posture/Postural Control Postural limitations    Postural Limitations Weight shift right;Flexed trunk    Posture Comments L toe out position      ROM / Strength   AROM / PROM / Strength AROM;Strength      AROM   Overall AROM Comments LE and trunk ROM grossly limited in all directions, stiffness of bilateral hips and knees, fear of movement without UE support or seated support      Strength   Overall Strength Comments generalized deconditioning and strength deficits; difficulty with anti gravity hip and LE movements, intolerance to standing for more than 2 mins without need for rest break      Ambulation/Gait   Ambulation/Gait Yes    Ambulation/Gait Assistance 5: Supervision    Ambulation Distance (  Feet) 30 Feet    Assistive device Straight cane    Gait Pattern Step-to pattern;Shuffle;Poor foot clearance - left;Poor foot clearance - right;Abducted - left    Ambulation Surface Level    Stairs No      Balance   Balance Assessed Yes      Standardized Balance Assessment   Standardized Balance Assessment Berg Balance Test;Timed Up and Go Test      Berg Balance Test   Sit to Stand Able to stand using hands after several tries    Standing Unsupported Able to stand safely 2 minutes    Sitting with Back Unsupported but Feet Supported on Floor or Stool Able to sit safely and securely 2 minutes    Stand to Sit Uses backs of legs against chair to control descent    Transfers Able to transfer safely, definite need of hands    Standing Unsupported with Eyes Closed Able to stand 10 seconds with supervision    Standing Unsupported with Feet Together Able to  place feet together independently and stand 1 minute safely    From Standing, Reach Forward with Outstretched Arm Can reach forward >5 cm safely (2")    From Standing Position, Pick up Object from Floor Unable to try/needs assist to keep balance    From Standing Position, Turn to Look Behind Over each Shoulder Needs supervision when turning    Turn 360 Degrees Able to turn 360 degrees safely but slowly    Standing Unsupported, Alternately Place Feet on Step/Stool Needs assistance to keep from falling or unable to try    Standing Unsupported, One Foot in [employer] balance while stepping or standing    Standing on One Leg Unable to try or needs assist to prevent fall    Total Score 27      Timed Up and Go Test   Normal TUG (seconds) 21.3                        Objective measurements completed on examination: See above findings.       [facility] Adult PT Treatment/Exercise - 09/17/20 0001       Exercises   Exercises Knee/Hip      Knee/Hip Exercises: Standing   Other Standing Knee Exercises heel toe rocking with UE support 10x each      Knee/Hip Exercises: Seated   Sit to Sand 3 sets;5 reps;without UE support                      PT Short Term Goals - 09/14/20 1323       PT SHORT TERM GOAL #1   Title Pt will become independent with HEP in order to demonstrate synthesis of PT education.    Time 4    Period Weeks    Status New      PT SHORT TERM GOAL #2   Title Pt will be able to perform 5XSTS in under 12  in order to demonstrate functional improvement above the cut off score for older adults.    Time 4    Period Weeks    Status New               PT Long Term Goals - 09/17/20 0932       PT LONG TERM GOAL #1   Title Pt will become independent with final HEP in order to demonstrate synthesis of PT education.    Time 8  Period Weeks    Status New      PT LONG TERM GOAL #2   Title Pt will be able to demonstrate TUG in under 10 sec in  order to demonstrate functional improvement in LE function, strength, balance, and mobility for safety with community ambulation.    Time 8    Period Weeks    Status New      PT LONG TERM GOAL #3   Title Berg balance >/= 48/56 to reduce risk for falls    Time 8    Period Weeks    Status New                    Plan - 09/17/20 0910     Clinical Impression Statement Pt is an 86 y.o. female presenting to PT with CC of balance deficits and unsteadiness. Pt presents with generalized deconditioning, LE weakness, gait deviations, and static/dynamic balance deficits. Pt is currently at a signficiantly increase risk for falls as demonstrated by low power output, decreased gait speed, and Berg score of 27/56. Pt has significant hesisitation and apprehenion of movement without UE support or AD. Pt appears motivated to participate with skilled therapy in order to improve her current functional status. Pt's impairments limit participation with ADL, community mobility, and household activity. Pt would benefit from continued skilled therapy in order to reach goals and maximize static and dynamic balance for prevention of future falls.    Personal Factors and Comorbidities Age;Comorbidity 1;Comorbidity 2;Comorbidity 3+;Behavior Pattern;Fitness    Examination-Activity Limitations Locomotion Level;Transfers;Bed Mobility;Stairs;Stand;Lift;Squat;Carry;Bend;Other    Examination-Participation Restrictions Community Activity;Driving;Yard Work;Cleaning;Interpersonal Relationship;Laundry    Stability/Clinical Decision Making Evolving/Moderate complexity    Rehab Potential Good    PT Frequency 2x / week    PT Duration 8 weeks    PT Treatment/Interventions ADLs/Self Care Home Management;Aquatic Therapy;Cryotherapy;Iontophoresis 4mg /ml Dexamethasone;Moist Heat;Traction;Ultrasound;Gait training;Stair training;Functional mobility training;Therapeutic activities;Therapeutic exercise;Balance training;Neuromuscular  re-education;Cognitive remediation;Patient/family education;Orthotic Fit/Training;Manual techniques;Scar mobilization;Passive range of motion;Dry needling;Energy conservation;Vasopneumatic Device;Spinal Manipulations;Joint Manipulations;Taping    PT Next Visit Plan review HEP, static standing balance, LE strength    PT Home Exercise Plan [email or web address]    Consulted and Agree with Plan of Care Patient             Patient will benefit from skilled therapeutic intervention in order to improve the following deficits and impairments:  Abnormal gait, Improper body mechanics, Decreased coordination, Decreased mobility, Postural dysfunction, Decreased activity tolerance, Decreased endurance, Decreased range of motion, Decreased strength, Obesity, Impaired flexibility, Difficulty walking, Decreased balance  Visit Diagnosis: Difficulty walking  Unsteadiness on feet  Muscle weakness (generalized)     Problem List Patient Active Problem List   Diagnosis Date Noted   Irregular heart beat 08/21/2020   PVC's (premature ventricular contractions) 07/04/2020   Allergic rhinitis 02/21/2020   Body mass index (BMI) 34.0-34.9, adult 02/21/2020   Callosity 02/21/2020   Diabetic renal disease ([place]) 02/21/2020   Disorder of the skin and subcutaneous tissue, unspecified 02/21/2020   Essential hypertension 02/21/2020   Impairment of balance 02/21/2020   Macrocytosis 02/21/2020   Onychomycosis 02/21/2020   Other intervertebral disc degeneration, lumbar region 02/21/2020   Pathological fracture 02/21/2020   Vitamin D deficiency 02/21/2020   Background diabetic retinopathy ([place]) 02/21/2020   Senile purpura ([place]) 02/21/2020   Stasis dermatitis 02/21/2020   CKD (chronic kidney disease) stage 3, GFR 30-59 ml/min (HCC) 10/08/2016   Osteoporosis, postmenopausal 04/08/2016   Primary hypothyroidism 04/08/2016   Type 1 diabetes mellitus with hyperglycemia ([place]) 04/08/2016  Type 1 diabetes mellitus with mild  nonproliferative retinopathy of both eyes without macular edema (HCC) 04/08/2016   Elevated serum immunoglobulin free light chain level 11/29/2013   Macrocytosis without anemia 11/29/2013   Unspecified deficiency anemia 11/29/2013   Edema    Daleen Bo PT, DPT 09/17/20 9:19 AM   [facility] [place], [place], [address] Phone: [phone]   Fax:  [phone]  Name: [name] Church MRN: [id number] Date of Birth: Feb 09, 1935

## 2020-09-19 ENCOUNTER — Ambulatory Visit (HOSPITAL_BASED_OUTPATIENT_CLINIC_OR_DEPARTMENT_OTHER): Payer: Medicare Other | Admitting: Physical Therapy

## 2020-09-19 ENCOUNTER — Other Ambulatory Visit: Payer: Self-pay

## 2020-09-19 DIAGNOSIS — R2681 Unsteadiness on feet: Secondary | ICD-10-CM | POA: Diagnosis not present

## 2020-09-19 DIAGNOSIS — R262 Difficulty in walking, not elsewhere classified: Secondary | ICD-10-CM

## 2020-09-19 DIAGNOSIS — M6281 Muscle weakness (generalized): Secondary | ICD-10-CM | POA: Diagnosis not present

## 2020-09-19 NOTE — Therapy (Signed)
[facility] [address] [place], [place], [address] Phone: [phone]   Fax:  [phone]  Physical Therapy Treatment  Patient Details  Name: [name] Church MRN: [id number] Date of Birth: 1934/05/27 Referring Provider (PT): Jonathon Jordan, MD   Encounter Date: 09/19/2020   PT End of Session - 09/19/20 1004     Visit Number 2    Number of Visits 17    Date for PT Re-Evaluation 12/13/20    Authorization Type U[employer]    PT Start Time 9528    PT Stop Time 1100    PT Time Calculation (min) 45 min             Past Medical History:  Diagnosis Date   Diabetes mellitus without complication ([place])    Edema    Elevated serum immunoglobulin free light chain level 11/29/2013   Macrocytosis without anemia 11/29/2013   Nonsustained ventricular tachycardia (HCC)    115 episodes of NSVT on mointor up to 6 beats in a row with maximum HR of 197bpm on heart monitor 06/2020   Osteoporosis    PAT (paroxysmal atrial tachycardia) (HCC)    nonsustained atrial tachycardia up to 23 seconds at a time on heart monitor 06/2020   Thyroid disease    Unspecified deficiency anemia 11/29/2013    Past Surgical History:  Procedure Laterality Date   APPENDECTOMY     EYE SURGERY Left 03/2013   eye bleeds internally   EYE SURGERY Right 03/2013   FRACTURE SURGERY Left 2012   leg with metal fixation    There were no vitals filed for this visit.   Subjective Assessment - 09/19/20 1005     Subjective Pt states she is doing well. She states she has been compliant with HEP.    Pertinent History DM1, CKD, OP, HTN, irregular heart rate    Patient Stated Goals Pt states she would like to be able to step up without feeling wobbly. Pt states she would like to walk without the cane and feel stable.    Currently in Pain? No/denies    Multiple Pain Sites No                               [facility] Adult PT Treatment/Exercise - 09/19/20 0001        Ambulation/Gait   Ambulation/Gait Yes    Ambulation/Gait Assistance 5: Supervision    Ambulation Distance (Feet) 50 Feet    Assistive device Straight cane    Gait Pattern Step-to pattern;Shuffle;Poor foot clearance - left;Poor foot clearance - right;Abducted - left              Posture/Postural Control   Posture/Postural Control Postural limitations    Postural Limitations Weight shift right;Flexed trunk    Posture Comments L toe out position      Exercises   Exercises Knee/Hip      Knee/Hip Exercises: Aerobic   Nustep L2 25min      Knee/Hip Exercises: Standing   Other Standing Knee Exercises heel toe rocking with UE support 10x each    Other Standing Knee Exercises NBOS balance EO 30s 3x                 Marching Limitations 20x 2x   Sit to Sand without UE support;10 reps;2 sets      Knee/Hip Exercises: Supine   Bridges Limitations 2x10  PT Education - 09/19/20 1004     Education Details balance systems, exercise progression, balance deficits, daily activity, exam findings, HEP, increasing daily physical activity    Person(s) Educated Patient    Methods Explanation;Demonstration;Tactile cues;Verbal cues    Comprehension Verbalized understanding;Returned demonstration;Verbal cues required;Tactile cues required              PT Short Term Goals - 09/14/20 1323       PT SHORT TERM GOAL #1   Title Pt will become independent with HEP in order to demonstrate synthesis of PT education.    Time 4    Period Weeks    Status New      PT SHORT TERM GOAL #2   Title Pt will be able to perform 5XSTS in under 12  in order to demonstrate functional improvement above the cut off score for older adults.    Time 4    Period Weeks    Status New               PT Long Term Goals - 09/17/20 0630       PT LONG TERM GOAL #1   Title Pt will become independent with final HEP in order to demonstrate synthesis of PT education.    Time 8     Period Weeks    Status New      PT LONG TERM GOAL #2   Title Pt will be able to demonstrate TUG in under 10 sec in order to demonstrate functional improvement in LE function, strength, balance, and mobility for safety with community ambulation.    Time 8    Period Weeks    Status New      PT LONG TERM GOAL #3   Title Berg balance >/= 48/56 to reduce risk for falls    Time 8    Period Weeks    Status New                   Plan - 09/19/20 1002     Clinical Impression Statement Pt able to progress functional balance and LE strength exercise at today's session. Pt able to increase repetitions with STS transfers with cuing for set up and alignment. However, pt has endurance deficits and fatigue that required increased frequency and duration of seated rest breaks. Pt continues to have hesisitation and apprehenion with gait and transfers without UE support or AD. Pt HEP was progressed to include posterior chain strengthening exercise as well as safe static balance at kitchen counter. Pt given continued edu about safe set up for home exercise. Pt gave verbal understanding. Pt would benefit from continued skilled therapy in order to reach goals and maximize static and dynamic balance for prevention of future falls.    Personal Factors and Comorbidities Age;Comorbidity 1;Comorbidity 2;Comorbidity 3+;Behavior Pattern;Fitness    Examination-Activity Limitations Locomotion Level;Transfers;Bed Mobility;Stairs;Stand;Lift;Squat;Carry;Bend;Other    Examination-Participation Restrictions Community Activity;Driving;Yard Work;Cleaning;Interpersonal Relationship;Laundry    Stability/Clinical Decision Making Evolving/Moderate complexity    Rehab Potential Good    PT Frequency 2x / week    PT Duration 8 weeks    PT Treatment/Interventions ADLs/Self Care Home Management;Aquatic Therapy;Cryotherapy;Iontophoresis 4mg /ml Dexamethasone;Moist Heat;Traction;Ultrasound;Gait training;Stair training;Functional  mobility training;Therapeutic activities;Therapeutic exercise;Balance training;Neuromuscular re-education;Cognitive remediation;Patient/family education;Orthotic Fit/Training;Manual techniques;Scar mobilization;Passive range of motion;Dry needling;Energy conservation;Vasopneumatic Device;Spinal Manipulations;Joint Manipulations;Taping    PT Next Visit Plan review HEP, static standing balance, LE strength    PT Home Exercise Plan [email or web address]    Consulted and Agree with Plan of Care Patient  Patient will benefit from skilled therapeutic intervention in order to improve the following deficits and impairments:  Abnormal gait, Improper body mechanics, Decreased coordination, Decreased mobility, Postural dysfunction, Decreased activity tolerance, Decreased endurance, Decreased range of motion, Decreased strength, Obesity, Impaired flexibility, Difficulty walking, Decreased balance  Visit Diagnosis: Difficulty walking  Unsteadiness on feet  Muscle weakness (generalized)     Problem List Patient Active Problem List   Diagnosis Date Noted   Irregular heart beat 08/21/2020   PVC's (premature ventricular contractions) 07/04/2020   Allergic rhinitis 02/21/2020   Body mass index (BMI) 34.0-34.9, adult 02/21/2020   Callosity 02/21/2020   Diabetic renal disease ([place]) 02/21/2020   Disorder of the skin and subcutaneous tissue, unspecified 02/21/2020   Essential hypertension 02/21/2020   Impairment of balance 02/21/2020   Macrocytosis 02/21/2020   Onychomycosis 02/21/2020   Other intervertebral disc degeneration, lumbar region 02/21/2020   Pathological fracture 02/21/2020   Vitamin D deficiency 02/21/2020   Background diabetic retinopathy ([place]) 02/21/2020   Senile purpura ([place]) 02/21/2020   Stasis dermatitis 02/21/2020   CKD (chronic kidney disease) stage 3, GFR 30-59 ml/min (HCC) 10/08/2016   Osteoporosis, postmenopausal 04/08/2016   Primary hypothyroidism 04/08/2016   Type 1  diabetes mellitus with hyperglycemia ([place]) 04/08/2016   Type 1 diabetes mellitus with mild nonproliferative retinopathy of both eyes without macular edema (HCC) 04/08/2016   Elevated serum immunoglobulin free light chain level 11/29/2013   Macrocytosis without anemia 11/29/2013   Unspecified deficiency anemia 11/29/2013   Edema    Daleen Bo PT, DPT 09/19/20 11:33 AM   [facility] [place], [place], [address] Phone: [phone]   Fax:  [phone]  Name: [name] Church MRN: [id number] Date of Birth: Aug 10, 1934

## 2020-09-21 ENCOUNTER — Ambulatory Visit (HOSPITAL_BASED_OUTPATIENT_CLINIC_OR_DEPARTMENT_OTHER): Payer: Medicare Other | Admitting: Physical Therapy

## 2020-10-03 ENCOUNTER — Ambulatory Visit (HOSPITAL_BASED_OUTPATIENT_CLINIC_OR_DEPARTMENT_OTHER): Payer: Medicare Other | Admitting: Physical Therapy

## 2020-10-05 ENCOUNTER — Ambulatory Visit (HOSPITAL_BASED_OUTPATIENT_CLINIC_OR_DEPARTMENT_OTHER): Payer: Medicare Other | Admitting: Physical Therapy

## 2020-10-08 ENCOUNTER — Telehealth: Payer: Self-pay | Admitting: Internal Medicine

## 2020-10-08 NOTE — Telephone Encounter (Signed)
Returned call to Pt.  Rescheduled follow up for 10/25/20.  Pt states she has fatigue, feels like she just "doesn't want to do anything".  States she doesn't have much of an appetite.  Not sure if it is the medication or "just old age"  Per Pt her recent BP checks have been "higher than normal"  Advised Pt to continue medications and will have Pt discuss with Dr. Lovena Le.

## 2020-10-08 NOTE — Telephone Encounter (Signed)
Pt called , she had to resch her aug appt with Dr Lovena Le she is in PT because she had fallen.  She would like to speak to nurse about meds she is taking.  She stated she does not have any energy , pt denies any other symptoms.     Best number [phone]

## 2020-10-10 ENCOUNTER — Encounter (HOSPITAL_BASED_OUTPATIENT_CLINIC_OR_DEPARTMENT_OTHER): Payer: Self-pay | Admitting: Physical Therapy

## 2020-10-10 ENCOUNTER — Other Ambulatory Visit: Payer: Self-pay

## 2020-10-10 ENCOUNTER — Ambulatory Visit (HOSPITAL_BASED_OUTPATIENT_CLINIC_OR_DEPARTMENT_OTHER): Payer: Medicare Other | Attending: Family Medicine | Admitting: Physical Therapy

## 2020-10-10 DIAGNOSIS — R2681 Unsteadiness on feet: Secondary | ICD-10-CM | POA: Diagnosis not present

## 2020-10-10 DIAGNOSIS — M6281 Muscle weakness (generalized): Secondary | ICD-10-CM | POA: Insufficient documentation

## 2020-10-10 DIAGNOSIS — R262 Difficulty in walking, not elsewhere classified: Secondary | ICD-10-CM | POA: Diagnosis not present

## 2020-10-10 NOTE — Therapy (Addendum)
[place], [place], [address] Phone: [phone]   Fax:  [phone]  Physical Therapy Treatment/ Discharge  Patient Details  Name: [name] Church MRN: [id number] Date of Birth: 03/22/1934 Referring Provider (PT): Jonathon Jordan, MD   Encounter Date: 10/10/2020   PT End of Session - 10/10/20 1110     Visit Number 3    Number of Visits 17    Date for PT Re-Evaluation 12/13/20    Authorization Type [employer]    PT Start Time 1105    PT Stop Time 1145    PT Time Calculation (min) 40 min    Equipment Utilized During Treatment Gait belt    Activity Tolerance Patient tolerated treatment well;Patient limited by fatigue    Behavior During Therapy [facility] for tasks assessed/performed             Past Medical History:  Diagnosis Date   Diabetes mellitus without complication ([place])    Edema    Elevated serum immunoglobulin free light chain level 11/29/2013   Macrocytosis without anemia 11/29/2013   Nonsustained ventricular tachycardia (HCC)    115 episodes of NSVT on mointor up to 6 beats in a row with maximum HR of 197bpm on heart monitor 06/2020   Osteoporosis    PAT (paroxysmal atrial tachycardia) (HCC)    nonsustained atrial tachycardia up to 23 seconds at a time on heart monitor 06/2020   Thyroid disease    Unspecified deficiency anemia 11/29/2013    Past Surgical History:  Procedure Laterality Date   APPENDECTOMY     EYE SURGERY Left 03/2013   eye bleeds internally   EYE SURGERY Right 03/2013   FRACTURE SURGERY Left 2012   leg with metal fixation    There were no vitals filed for this visit.   Subjective Assessment - 10/10/20 1107     Subjective Pt states that she has been feeling unwell and unmotivated with physical activity in general. She continues to feels uneasy and anxious about walking bc of the fear of falling.    Pertinent History DM1, CKD, OP, HTN, irregular heart rate    Patient Stated  Goals Pt states she would like to be able to step up without feeling wobbly. Pt states she would like to walk without the cane and feel stable.    Currently in Pain? No/denies    Multiple Pain Sites No                    [facility] Adult PT Treatment/Exercise - 10/10/20 0001       Ambulation/Gait   Ambulation/Gait Yes    Ambulation/Gait Assistance 5: Supervision    Ambulation Distance (Feet) 50 Feet    Assistive device Straight cane    Gait Pattern Step-to pattern;Shuffle;Poor foot clearance - left;Poor foot clearance - right;Abducted - left      Posture/Postural Control   Posture/Postural Control Postural limitations    Postural Limitations Weight shift right;Flexed trunk    Posture Comments L toe out position      Exercises   Exercises Knee/Hip      Knee/Hip Exercises: Aerobic   Nustep L2 11mn      Knee/Hip Exercises: Standing   Other Standing Knee Exercises heel toe rocking with UE support 10x each    Other Standing Knee Exercises NBOS balance 30s 3x- CGA      Knee/Hip Exercises: Seated   Marching Limitations 20x    Sit to Sand without UE support;10  reps;2 sets      Knee/Hip Exercises: Supine   Bridges Limitations 3x10                    PT Education - 10/10/20 1109     Education Details exercise progression, balance deficits, daily activity, exam findings, HEP, increasing daily physical activity, compliance    Person(s) Educated Patient    Methods Explanation;Demonstration;Tactile cues;Verbal cues;Handout    Comprehension Verbalized understanding;Returned demonstration;Verbal cues required;Tactile cues required              PT Short Term Goals - 09/14/20 1323       PT SHORT TERM GOAL #1   Title Pt will become independent with HEP in order to demonstrate synthesis of PT education.    Time 4    Period Weeks    Status New      PT SHORT TERM GOAL #2   Title Pt will be able to perform 5XSTS in under 12  in order to demonstrate functional  improvement above the cut off score for older adults.    Time 4    Period Weeks    Status New               PT Long Term Goals - 09/17/20 2353       PT LONG TERM GOAL #1   Title Pt will become independent with final HEP in order to demonstrate synthesis of PT education.    Time 8    Period Weeks    Status New      PT LONG TERM GOAL #2   Title Pt will be able to demonstrate TUG in under 10 sec in order to demonstrate functional improvement in LE function, strength, balance, and mobility for safety with community ambulation.    Time 8    Period Weeks    Status New      PT LONG TERM GOAL #3   Title Berg balance >/= 48/56 to reduce risk for falls    Time 8    Period Weeks    Status New                   Plan - 10/10/20 1110     Clinical Impression Statement Pt continues to have endurance deficits and fatigue that required increased frequency and duration of seated rest breaks. Pt also voices concerns about falling and anxiety with standing exercise. Pt with continued apprehenion with transfers without UE support or AD. Pt requires CGA-supervision for all standing exercise. Pt with complaints of dizzniess without movement that may be related to polypharmacy. Pt to consult MD about medication management. Pt Pt given continued edu about safe set up for home exercise. Pt gave verbal understanding. Pt would benefit from continued skilled therapy in order to reach goals and maximize static and dynamic balance for prevention of future falls.    Personal Factors and Comorbidities Age;Comorbidity 1;Comorbidity 2;Comorbidity 3+;Behavior Pattern;Fitness    Examination-Activity Limitations Locomotion Level;Transfers;Bed Mobility;Stairs;Stand;Lift;Squat;Carry;Bend;Other    Examination-Participation Restrictions Community Activity;Driving;Yard Work;Cleaning;Interpersonal Relationship;Laundry    Stability/Clinical Decision Making Evolving/Moderate complexity    Rehab Potential Good     PT Frequency 2x / week    PT Duration 8 weeks    PT Treatment/Interventions ADLs/Self Care Home Management;Aquatic Therapy;Cryotherapy;Iontophoresis 67m/ml Dexamethasone;Moist Heat;Traction;Ultrasound;Gait training;Stair training;Functional mobility training;Therapeutic activities;Therapeutic exercise;Balance training;Neuromuscular re-education;Cognitive remediation;Patient/family education;Orthotic Fit/Training;Manual techniques;Scar mobilization;Passive range of motion;Dry needling;Energy conservation;Vasopneumatic Device;Spinal Manipulations;Joint Manipulations;Taping    PT Next Visit Plan review HEP, static standing balance, LE  strength    PT Home Exercise Plan [id number]    Consulted and Agree with Plan of Care Patient             Patient will benefit from skilled therapeutic intervention in order to improve the following deficits and impairments:  Abnormal gait, Improper body mechanics, Decreased coordination, Decreased mobility, Postural dysfunction, Decreased activity tolerance, Decreased endurance, Decreased range of motion, Decreased strength, Obesity, Impaired flexibility, Difficulty walking, Decreased balance  Visit Diagnosis: Difficulty walking  Unsteadiness on feet  Muscle weakness (generalized)     Problem List Patient Active Problem List   Diagnosis Date Noted   Irregular heart beat 08/21/2020   PVC's (premature ventricular contractions) 07/04/2020   Allergic rhinitis 02/21/2020   Body mass index (BMI) 34.0-34.9, adult 02/21/2020   Callosity 02/21/2020   Diabetic renal disease ([place]) 02/21/2020   Disorder of the skin and subcutaneous tissue, unspecified 02/21/2020   Essential hypertension 02/21/2020   Impairment of balance 02/21/2020   Macrocytosis 02/21/2020   Onychomycosis 02/21/2020   Other intervertebral disc degeneration, lumbar region 02/21/2020   Pathological fracture 02/21/2020   Vitamin D deficiency 02/21/2020   Background diabetic retinopathy ([place])  02/21/2020   Senile purpura ([place]) 02/21/2020   Stasis dermatitis 02/21/2020   CKD (chronic kidney disease) stage 3, GFR 30-59 ml/min (HCC) 10/08/2016   Osteoporosis, postmenopausal 04/08/2016   Primary hypothyroidism 04/08/2016   Type 1 diabetes mellitus with hyperglycemia ([place]) 04/08/2016   Type 1 diabetes mellitus with mild nonproliferative retinopathy of both eyes without macular edema (HCC) 04/08/2016   Elevated serum immunoglobulin free light chain level 11/29/2013   Macrocytosis without anemia 11/29/2013   Unspecified deficiency anemia 11/29/2013   Edema    Daleen Bo PT, DPT 10/10/20 11:43 AM   [place], [place], [address] Phone: [phone]   Fax:  [phone]  Name: [name] Church MRN: [id number] Date of Birth: 07-Aug-1934   PHYSICAL THERAPY DISCHARGE SUMMARY  Visits from Start of Care: 3  Plan: Patient agrees to discharge.  Patient goals were not met. Patient is being discharged due to not returning to PT since last visit.

## 2020-10-12 ENCOUNTER — Ambulatory Visit: Payer: Medicare Other | Admitting: Internal Medicine

## 2020-10-17 ENCOUNTER — Ambulatory Visit (HOSPITAL_BASED_OUTPATIENT_CLINIC_OR_DEPARTMENT_OTHER): Payer: Medicare Other | Admitting: Physical Therapy

## 2020-10-19 ENCOUNTER — Ambulatory Visit (HOSPITAL_BASED_OUTPATIENT_CLINIC_OR_DEPARTMENT_OTHER): Payer: Medicare Other | Admitting: Physical Therapy

## 2020-10-24 ENCOUNTER — Encounter (HOSPITAL_BASED_OUTPATIENT_CLINIC_OR_DEPARTMENT_OTHER): Payer: Medicare Other | Admitting: Physical Therapy

## 2020-10-25 ENCOUNTER — Other Ambulatory Visit: Payer: Self-pay

## 2020-10-25 ENCOUNTER — Ambulatory Visit: Payer: Medicare Other | Admitting: Internal Medicine

## 2020-10-25 VITALS — BP 154/68 | HR 63 | Ht 64.0 in | Wt 188.2 lb

## 2020-10-25 DIAGNOSIS — I493 Ventricular premature depolarization: Secondary | ICD-10-CM | POA: Diagnosis not present

## 2020-10-25 NOTE — Patient Instructions (Signed)
Medication Instructions:  Your physician has recommended you make the following change in your medication:   ** Stop Flecainide and if in one week your dizziness is not any better restart Flecainide.  *If you need a refill on your cardiac medications before your next appointment, please call your pharmacy*   Lab Work: None ordered.  If you have labs (blood work) drawn today and your tests are completely normal, you will receive your results only by: [place] (if you have MyChart) OR A paper copy in the mail If you have any lab test that is abnormal or we need to change your treatment, we will call you to review the results.   Testing/Procedures: None ordered.    Follow-Up: At [facility], you and your health needs are our priority.  As part of our continuing mission to provide you with exceptional heart care, we have created designated Provider Care Teams.  These Care Teams include your primary Cardiologist (physician) and Advanced Practice Providers (APPs -  Physician Assistants and Nurse Practitioners) who all work together to provide you with the care you need, when you need it.  We recommend signing up for the patient portal called "MyChart".  Sign up information is provided on this After Visit Summary.  MyChart is used to connect with patients for Virtual Visits (Telemedicine).  Patients are able to view lab/test results, encounter notes, upcoming appointments, etc.  Non-urgent messages can be sent to your provider as well.   To learn more about what you can do with MyChart, go to [email or web address].    Your next appointment:    ** Tuesday, October 4,2022 at 9:15am   Other Instructions  ** Check your blood pressure when you are dizzy

## 2020-10-25 NOTE — Progress Notes (Signed)
HPI [name] returns today for followup. She is a pleasant 86 yo woman with a h/o HTN, who was seen by me for evaluation of PVC's. She was initially placed on Mexitil which was ineffective and then placed on low dose flecainide. The patient has a reduction in her symptoms of palpitations. However, she has c/o dizziness especially when she stands up. She does not check her bp at home. She wonders if her dizziness is due to the flecainide. She has not had syncope.  Allergies  Allergen Reactions   Mexiletine     Other reaction(s): Other (See Comments) Off balance    Sulfa Antibiotics    Sulfamethoxazole Rash     Current Outpatient Medications  Medication Sig Dispense Refill   ACCU-CHEK AVIVA PLUS test strip USE TO TEST BLOOD SUGARS QID  10   ACCU-CHEK FASTCLIX LANCETS MISC      aspirin 325 MG EC tablet Take 325 mg by mouth daily.     BD PEN NEEDLE NANO U/F 32G X 4 MM MISC USE AS DIRECTED 4-5 TIMES A DAY  4   dorzolamide-timolol (COSOPT) 22.3-6.8 MG/ML ophthalmic solution Place 1 drop into both eyes 2 (two) times daily.     ferrous sulfate 325 (65 FE) MG tablet Take 1 tablet once a day     flecainide (TAMBOCOR) 50 MG tablet Take 1 tablet (50 mg total) by mouth 2 (two) times daily. 60 tablet 11   furosemide (LASIX) 20 MG tablet Take 20 mg by mouth.     HUMALOG KWIKPEN 100 UNIT/ML KiwkPen 2 (two) times daily.  5   hydrochlorothiazide (MICROZIDE) 12.5 MG capsule Take 12.5 mg by mouth daily.     Iron 66 MG TABS Take by mouth.     Lancet Devices ([employer] LANCING DEVICE) MISC      latanoprost (XALATAN) 0.005 % ophthalmic solution SMARTSIG:In Eye(s)     levothyroxine (SYNTHROID, LEVOTHROID) 137 MCG tablet Take 137 mcg by mouth daily before breakfast.     lisinopril (ZESTRIL) 5 MG tablet Take 1 tablet by mouth daily.     loratadine (CLARITIN) 10 MG tablet Take 1 tablet once a day as needed     metoprolol succinate (TOPROL XL) 25 MG 24 hr tablet Take 1 tablet (25 mg total) by mouth daily. 90  tablet 3   Multiple Minerals-Vitamins (CALCIUM & VIT D3 BONE HEALTH PO) Take by mouth.     Multiple Vitamin (MULTI-VITAMINS) TABS Take 1 tablet once a day     Multiple Vitamins-Minerals (MULTIVITAMIN WITH MINERALS) tablet Take 1 tablet by mouth daily.     mupirocin cream (BACTROBAN) 2 % Apply to left great toe once daily. 30 g 0   Oyster Shell Calcium 500 MG TABS 1 tablet with meals     TRESIBA FLEXTOUCH 200 UNIT/ML FlexTouch Pen SMARTSIG:14 Unit(s) SUB-Q Every Morning     Vitamin D, Cholecalciferol, 1000 UNITS TABS Take by mouth.     No current facility-administered medications for this visit.     Past Medical History:  Diagnosis Date   Diabetes mellitus without complication (HCC)    Edema    Elevated serum immunoglobulin free light chain level 11/29/2013   Macrocytosis without anemia 11/29/2013   Nonsustained ventricular tachycardia (HCC)    115 episodes of NSVT on mointor up to 6 beats in a row with maximum HR of 197bpm on heart monitor 06/2020   Osteoporosis    PAT (paroxysmal atrial tachycardia) (HCC)    nonsustained atrial tachycardia  up to 23 seconds at a time on heart monitor 06/2020   Thyroid disease    Unspecified deficiency anemia 11/29/2013    ROS:   All systems reviewed and negative except as noted in the HPI.   Past Surgical History:  Procedure Laterality Date   APPENDECTOMY     EYE SURGERY Left 03/2013   eye bleeds internally   EYE SURGERY Right 03/2013   FRACTURE SURGERY Left 2012   leg with metal fixation     Family History  Problem Relation Age of Onset   Cancer Mother        colon ca   Cancer Brother        prostate ca   Cancer Brother        lung ca     Social History   Socioeconomic History   Marital status: Married    Spouse name: Not on file   Number of children: Not on file   Years of education: Not on file   Highest education level: Not on file  Occupational History   Not on file  Tobacco Use   Smoking status: Never   Smokeless  tobacco: Never  Substance and Sexual Activity   Alcohol use: No   Drug use: No   Sexual activity: Not on file  Other Topics Concern   Not on file  Social History Narrative   Not on file   Social Determinants of Health   Financial Resource Strain: Not on file  Food Insecurity: Not on file  Transportation Needs: Not on file  Physical Activity: Not on file  Stress: Not on file  Social Connections: Not on file  Intimate Partner Violence: Not on file     BP (!) 154/68   Pulse 63   Ht '5\' 4"'$  (1.626 m)   Wt 188 lb 3.2 oz (85.4 kg)   SpO2 98%   BMI 32.30 kg/m   Physical Exam:  Well appearing NAD HEENT: Unremarkable Neck:  No JVD, no thyromegally Lymphatics:  No adenopathy Back:  No CVA tenderness Lungs:  Clear with no wheezes HEART:  Regular rate rhythm, no murmurs, no rubs, no clicks Abd:  soft, positive bowel sounds, no organomegally, no rebound, no guarding Ext:  2 plus pulses, no edema, no cyanosis, no clubbing Skin:  No rashes no nodules Neuro:  CN II through XII intact, motor grossly intact  EKG - NSR  DEVICE  Normal device function.  See PaceArt for details.   Assess/Plan:  Dizziness - the patient has dizziness and we do not know why. I asked her to hold the flecainide for 7 days. If her dizziness resolves then she will let us know and we will switch to another drug, likely rhythmol. If no improvement then she will restart the flecainide and I have asked her to check her bp when she gets dizzy.  HTN - her bp is controlled.  Obesity - her weight is down 6 lbs.  PVC's/NSVT - we will repeat her Zio monitor if she stays on flecainide. We will follow.  Carleene Overlie Damione Robideau,MD

## 2020-10-26 ENCOUNTER — Ambulatory Visit (HOSPITAL_BASED_OUTPATIENT_CLINIC_OR_DEPARTMENT_OTHER): Payer: Medicare Other | Admitting: Physical Therapy

## 2020-10-26 ENCOUNTER — Telehealth (HOSPITAL_BASED_OUTPATIENT_CLINIC_OR_DEPARTMENT_OTHER): Payer: Self-pay | Admitting: Physical Therapy

## 2020-10-26 NOTE — Telephone Encounter (Signed)
Spoke with pt regarding missed/canceled visits. Pt reports getting medication changes from MD and is waiting to see effects on her dizziness and balance. Pt is worried about falling trying to get to PT appt. Pt to return to PT when she is medically managed.   Daleen Bo PT, DPT 10/26/20 10:49 AM

## 2020-10-30 ENCOUNTER — Ambulatory Visit: Payer: Medicare Other | Admitting: Podiatry

## 2020-10-30 ENCOUNTER — Encounter: Payer: Self-pay | Admitting: Podiatry

## 2020-10-30 ENCOUNTER — Other Ambulatory Visit: Payer: Self-pay

## 2020-10-30 DIAGNOSIS — E1022 Type 1 diabetes mellitus with diabetic chronic kidney disease: Secondary | ICD-10-CM

## 2020-10-30 DIAGNOSIS — B351 Tinea unguium: Secondary | ICD-10-CM

## 2020-10-30 DIAGNOSIS — M79675 Pain in left toe(s): Secondary | ICD-10-CM

## 2020-10-30 DIAGNOSIS — N183 Chronic kidney disease, stage 3 unspecified: Secondary | ICD-10-CM

## 2020-10-30 DIAGNOSIS — S90112A Contusion of left great toe without damage to nail, initial encounter: Secondary | ICD-10-CM | POA: Diagnosis not present

## 2020-10-30 DIAGNOSIS — M79674 Pain in right toe(s): Secondary | ICD-10-CM

## 2020-10-31 ENCOUNTER — Encounter (HOSPITAL_BASED_OUTPATIENT_CLINIC_OR_DEPARTMENT_OTHER): Payer: Medicare Other | Admitting: Physical Therapy

## 2020-11-01 DIAGNOSIS — Z23 Encounter for immunization: Secondary | ICD-10-CM | POA: Diagnosis not present

## 2020-11-01 DIAGNOSIS — R001 Bradycardia, unspecified: Secondary | ICD-10-CM | POA: Diagnosis not present

## 2020-11-02 ENCOUNTER — Telehealth: Payer: Self-pay | Admitting: Internal Medicine

## 2020-11-02 ENCOUNTER — Encounter (HOSPITAL_BASED_OUTPATIENT_CLINIC_OR_DEPARTMENT_OTHER): Payer: Medicare Other | Admitting: Physical Therapy

## 2020-11-02 NOTE — Telephone Encounter (Signed)
Returned call to Pt.  Advised per Dr. Leona Singleton flecainide.  Will discuss further tx at upcoming appointment.

## 2020-11-02 NOTE — Telephone Encounter (Signed)
Pt states that she has completed 1 week off of Slecainide and is calling back in to report that her dizziness has stopped. Pt would like to know if she should start back taking this or stay off of it.. please advise
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
## 2020-11-07 ENCOUNTER — Telehealth: Payer: Self-pay

## 2020-11-07 ENCOUNTER — Encounter (HOSPITAL_BASED_OUTPATIENT_CLINIC_OR_DEPARTMENT_OTHER): Payer: Medicare Other | Admitting: Physical Therapy

## 2020-11-07 NOTE — Telephone Encounter (Signed)
Call taken from [facility].  Wanted to advise Pt's heart rate in the 30-40's when she is sleeping.  Advised this was normal.

## 2020-11-09 ENCOUNTER — Encounter (HOSPITAL_BASED_OUTPATIENT_CLINIC_OR_DEPARTMENT_OTHER): Payer: Medicare Other | Admitting: Physical Therapy

## 2020-11-09 DIAGNOSIS — H401133 Primary open-angle glaucoma, bilateral, severe stage: Secondary | ICD-10-CM | POA: Diagnosis not present

## 2020-11-09 DIAGNOSIS — H0100B Unspecified blepharitis left eye, upper and lower eyelids: Secondary | ICD-10-CM | POA: Diagnosis not present

## 2020-11-09 DIAGNOSIS — H0100A Unspecified blepharitis right eye, upper and lower eyelids: Secondary | ICD-10-CM | POA: Diagnosis not present

## 2020-11-09 DIAGNOSIS — H04123 Dry eye syndrome of bilateral lacrimal glands: Secondary | ICD-10-CM | POA: Diagnosis not present

## 2020-11-14 ENCOUNTER — Encounter (HOSPITAL_BASED_OUTPATIENT_CLINIC_OR_DEPARTMENT_OTHER): Payer: Medicare Other | Admitting: Physical Therapy

## 2020-11-16 ENCOUNTER — Encounter (HOSPITAL_BASED_OUTPATIENT_CLINIC_OR_DEPARTMENT_OTHER): Payer: Medicare Other | Admitting: Physical Therapy

## 2020-11-21 ENCOUNTER — Encounter (HOSPITAL_BASED_OUTPATIENT_CLINIC_OR_DEPARTMENT_OTHER): Payer: Medicare Other | Admitting: Physical Therapy

## 2020-11-21 DIAGNOSIS — E1065 Type 1 diabetes mellitus with hyperglycemia: Secondary | ICD-10-CM | POA: Diagnosis not present

## 2020-11-21 DIAGNOSIS — N1832 Chronic kidney disease, stage 3b: Secondary | ICD-10-CM | POA: Diagnosis not present

## 2020-11-23 ENCOUNTER — Encounter (HOSPITAL_BASED_OUTPATIENT_CLINIC_OR_DEPARTMENT_OTHER): Payer: Medicare Other | Admitting: Physical Therapy

## 2020-11-26 DIAGNOSIS — Z85828 Personal history of other malignant neoplasm of skin: Secondary | ICD-10-CM | POA: Diagnosis not present

## 2020-11-26 DIAGNOSIS — L821 Other seborrheic keratosis: Secondary | ICD-10-CM | POA: Diagnosis not present

## 2020-11-26 DIAGNOSIS — I872 Venous insufficiency (chronic) (peripheral): Secondary | ICD-10-CM | POA: Diagnosis not present

## 2020-11-26 DIAGNOSIS — I8311 Varicose veins of right lower extremity with inflammation: Secondary | ICD-10-CM | POA: Diagnosis not present

## 2020-11-26 DIAGNOSIS — D2262 Melanocytic nevi of left upper limb, including shoulder: Secondary | ICD-10-CM | POA: Diagnosis not present

## 2020-11-26 DIAGNOSIS — I8312 Varicose veins of left lower extremity with inflammation: Secondary | ICD-10-CM | POA: Diagnosis not present

## 2020-11-26 DIAGNOSIS — L820 Inflamed seborrheic keratosis: Secondary | ICD-10-CM | POA: Diagnosis not present

## 2020-11-27 DIAGNOSIS — E1065 Type 1 diabetes mellitus with hyperglycemia: Secondary | ICD-10-CM | POA: Diagnosis not present

## 2020-11-27 DIAGNOSIS — E039 Hypothyroidism, unspecified: Secondary | ICD-10-CM | POA: Diagnosis not present

## 2020-11-27 DIAGNOSIS — E1069 Type 1 diabetes mellitus with other specified complication: Secondary | ICD-10-CM | POA: Diagnosis not present

## 2020-11-27 DIAGNOSIS — E10649 Type 1 diabetes mellitus with hypoglycemia without coma: Secondary | ICD-10-CM | POA: Diagnosis not present

## 2020-11-27 DIAGNOSIS — N1832 Chronic kidney disease, stage 3b: Secondary | ICD-10-CM | POA: Diagnosis not present

## 2020-11-27 DIAGNOSIS — M810 Age-related osteoporosis without current pathological fracture: Secondary | ICD-10-CM | POA: Diagnosis not present

## 2020-11-27 DIAGNOSIS — E1022 Type 1 diabetes mellitus with diabetic chronic kidney disease: Secondary | ICD-10-CM | POA: Diagnosis not present

## 2020-11-27 DIAGNOSIS — E785 Hyperlipidemia, unspecified: Secondary | ICD-10-CM | POA: Diagnosis not present

## 2020-11-27 DIAGNOSIS — I129 Hypertensive chronic kidney disease with stage 1 through stage 4 chronic kidney disease, or unspecified chronic kidney disease: Secondary | ICD-10-CM | POA: Diagnosis not present

## 2020-12-04 ENCOUNTER — Encounter: Payer: Self-pay | Admitting: Internal Medicine

## 2020-12-04 ENCOUNTER — Ambulatory Visit: Payer: Medicare Other | Admitting: Internal Medicine

## 2020-12-04 ENCOUNTER — Other Ambulatory Visit: Payer: Self-pay

## 2020-12-04 VITALS — BP 160/60 | HR 75 | Ht 64.0 in | Wt 190.0 lb

## 2020-12-04 DIAGNOSIS — I10 Essential (primary) hypertension: Secondary | ICD-10-CM | POA: Diagnosis not present

## 2020-12-04 DIAGNOSIS — I493 Ventricular premature depolarization: Secondary | ICD-10-CM

## 2020-12-04 NOTE — Patient Instructions (Addendum)
Medication Instructions:  Your physician recommends that you continue on your current medications as directed. Please refer to the Current Medication list given to you today.  Labwork: None ordered.  Testing/Procedures: None ordered.  Follow-Up: Your physician wants you to follow-up in: 4 months with Cristopher Peru, MD      Any Other Special Instructions Will Be Listed Below (If Applicable).  If you need a refill on your cardiac medications before your next appointment, please call your pharmacy.

## 2020-12-04 NOTE — Progress Notes (Signed)
HPI [name] returns today for followup. She is a pleasant 86 yo woman with a h/o HTN, who was seen by me for evaluation of PVC's. She was initially placed on Mexitil which was ineffective and then placed on low dose flecainide. She stopped flecainide after it made her feel bad. She still feels her heart beating irregularly when she lays down to go to bed but her daughter who is with her thinks that she is doing better.  Allergies  Allergen Reactions   Mexiletine     Other reaction(s): Other (See Comments) Off balance    Sulfa Antibiotics    Sulfamethoxazole Rash     Current Outpatient Medications  Medication Sig Dispense Refill   ACCU-CHEK AVIVA PLUS test strip USE TO TEST BLOOD SUGARS QID  10   ACCU-CHEK FASTCLIX LANCETS MISC      aspirin 325 MG EC tablet Take 325 mg by mouth daily.     BD PEN NEEDLE NANO U/F 32G X 4 MM MISC USE AS DIRECTED 4-5 TIMES A DAY  4   dorzolamide-timolol (COSOPT) 22.3-6.8 MG/ML ophthalmic solution Place 1 drop into both eyes 2 (two) times daily.     ferrous sulfate 325 (65 FE) MG tablet Take 1 tablet once a day     furosemide (LASIX) 20 MG tablet Take 20 mg by mouth.     HUMALOG KWIKPEN 100 UNIT/ML KiwkPen 2 (two) times daily.  5   hydrochlorothiazide (MICROZIDE) 12.5 MG capsule Take 12.5 mg by mouth daily.     Iron 66 MG TABS Take by mouth.     Lancet Devices ([employer] LANCING DEVICE) MISC      latanoprost (XALATAN) 0.005 % ophthalmic solution SMARTSIG:In Eye(s)     levothyroxine (SYNTHROID, LEVOTHROID) 137 MCG tablet Take 137 mcg by mouth daily before breakfast.     lisinopril (ZESTRIL) 5 MG tablet Take 1 tablet by mouth daily.     loratadine (CLARITIN) 10 MG tablet Take 1 tablet once a day as needed     metoprolol succinate (TOPROL XL) 25 MG 24 hr tablet Take 1 tablet (25 mg total) by mouth daily. 90 tablet 3   Multiple Minerals-Vitamins (CALCIUM & VIT D3 BONE HEALTH PO) Take by mouth.     Multiple Vitamin (MULTI-VITAMINS) TABS Take 1 tablet once a  day     Multiple Vitamins-Minerals (MULTIVITAMIN WITH MINERALS) tablet Take 1 tablet by mouth daily.     mupirocin cream (BACTROBAN) 2 % Apply to left great toe once daily. 30 g 0   Oyster Shell Calcium 500 MG TABS 1 tablet with meals     TRESIBA FLEXTOUCH 200 UNIT/ML FlexTouch Pen SMARTSIG:14 Unit(s) SUB-Q Every Morning     Vitamin D, Cholecalciferol, 1000 UNITS TABS Take by mouth.     No current facility-administered medications for this visit.     Past Medical History:  Diagnosis Date   Diabetes mellitus without complication (HCC)    Edema    Elevated serum immunoglobulin free light chain level 11/29/2013   Macrocytosis without anemia 11/29/2013   Nonsustained ventricular tachycardia    115 episodes of NSVT on mointor up to 6 beats in a row with maximum HR of 197bpm on heart monitor 06/2020   Osteoporosis    PAT (paroxysmal atrial tachycardia) (HCC)    nonsustained atrial tachycardia up to 23 seconds at a time on heart monitor 06/2020   Thyroid disease    Unspecified deficiency anemia 11/29/2013    ROS:   All  systems reviewed and negative except as noted in the HPI.   Past Surgical History:  Procedure Laterality Date   APPENDECTOMY     EYE SURGERY Left 03/2013   eye bleeds internally   EYE SURGERY Right 03/2013   FRACTURE SURGERY Left 2012   leg with metal fixation     Family History  Problem Relation Age of Onset   Cancer Mother        colon ca   Cancer Brother        prostate ca   Cancer Brother        lung ca     Social History   Socioeconomic History   Marital status: Married    Spouse name: Not on file   Number of children: Not on file   Years of education: Not on file   Highest education level: Not on file  Occupational History   Not on file  Tobacco Use   Smoking status: Never   Smokeless tobacco: Never  Substance and Sexual Activity   Alcohol use: No   Drug use: No   Sexual activity: Not on file  Other Topics Concern   Not on file  Social  History Narrative   Not on file   Social Determinants of Health   Financial Resource Strain: Not on file  Food Insecurity: Not on file  Transportation Needs: Not on file  Physical Activity: Not on file  Stress: Not on file  Social Connections: Not on file  Intimate Partner Violence: Not on file     BP (!) 160/60 (BP Location: Left Arm, Patient Position: Sitting, Cuff Size: Normal)   Pulse 75   Ht 5\' 4"  (1.626 m)   Wt 190 lb (86.2 kg)   BMI 32.61 kg/m   Physical Exam:  Well appearing 86 yo woman, NAD HEENT: Unremarkable Neck:  No JVD, no thyromegally Lymphatics:  No adenopathy Back:  No CVA tenderness Lungs:  Clear with no wheezes HEART:  IRegular rate rhythm, no murmurs, no rubs, no clicks Abd:  soft, positive bowel sounds, no organomegally, no rebound, no guarding Ext:  2 plus pulses, no edema, no cyanosis, no clubbing Skin:  No rashes no nodules Neuro:  CN II through XII intact, motor grossly intact  EKG - NSR with bigeminal PVC's.    Assess/Plan:   Dizziness - this has improved since stopping her flecainide. HTN - her bp is controlled.  Obesity - her weight is unchanged.  PVC's/NSVT - she is still having a lot of ventricular ectopy but she feels better. We discussed the treatment options. She has felt bad taking mexitil and flecainide and I am reluctant to try another AA drug or recommend ablation due to her advanced age. She will undergo watchful waiting and I will see her back in 4 months.   Carleene Overlie Jehan Ranganathan,MD

## 2020-12-28 ENCOUNTER — Ambulatory Visit: Payer: Medicare Other | Admitting: Internal Medicine

## 2021-02-06 ENCOUNTER — Ambulatory Visit: Payer: Medicare Other | Admitting: Podiatry

## 2021-02-13 ENCOUNTER — Ambulatory Visit: Payer: Medicare Other | Admitting: Podiatry

## 2021-02-26 ENCOUNTER — Ambulatory Visit: Payer: Medicare Other | Admitting: Podiatry

## 2021-02-26 ENCOUNTER — Other Ambulatory Visit: Payer: Self-pay

## 2021-02-26 ENCOUNTER — Encounter: Payer: Self-pay | Admitting: Podiatry

## 2021-02-26 DIAGNOSIS — M79675 Pain in left toe(s): Secondary | ICD-10-CM

## 2021-02-26 DIAGNOSIS — E1022 Type 1 diabetes mellitus with diabetic chronic kidney disease: Secondary | ICD-10-CM | POA: Diagnosis not present

## 2021-02-26 DIAGNOSIS — B351 Tinea unguium: Secondary | ICD-10-CM | POA: Diagnosis not present

## 2021-02-26 DIAGNOSIS — M79674 Pain in right toe(s): Secondary | ICD-10-CM | POA: Diagnosis not present

## 2021-02-26 DIAGNOSIS — N183 Chronic kidney disease, stage 3 unspecified: Secondary | ICD-10-CM | POA: Diagnosis not present

## 2021-02-26 NOTE — Progress Notes (Signed)
°  Subjective:  Patient ID: [name] Church, female    DOB: June 14, 1934,  MRN: [id number]  86 y.o. female presents with at risk foot care. Pt has h/o NIDDM with chronic kidney disease and painful thick toenails that are difficult to trim. Pain interferes with ambulation. Aggravating factors include wearing enclosed shoe gear. Pain is relieved with periodic professional debridement..     Last A1c was 7.1%.  PCP: Jonathon Jordan, MD and last visit was: 05/01/2020. She is also followed by Dr. Legrand Como Altheimer with Endocrinology. Her last visit was 08/22/2020.  Review of Systems: Negative except as noted in the HPI.   Allergies  Allergen Reactions   Mexiletine     Other reaction(s): Other (See Comments) Off balance    Sulfa Antibiotics    Sulfamethoxazole Rash    Objective:  There were no vitals filed for this visit. Constitutional Patient is a pleasant 86 y.o. Caucasian female in NAD. AAO x 3.  Vascular Capillary fill time to digits <3 seconds b/l lower extremities. Palpable pedal pulses b/l. Pedal hair sparse b/l. Lower extremity skin temperature gradient within normal limits. No pain with calf compression b/l. Mild edema left great toe. No warmth or abnormal coolness. No cyanosis or clubbing noted.  Neurologic Normal speech. Protective sensation intact 5/5 intact bilaterally with 10g monofilament b/l. Vibratory sensation intact b/l.  Dermatologic Skin with normal turgor, texture and tone b/l. Small area of ecchymosis dorsal aspect of left hallux.  No open wounds b/l lower extremities. No interdigital macerations b/l lower extremities. Toenails 1-5 b/l elongated, discolored, dystrophic, thickened, crumbly with subungual debris and tenderness to dorsal palpation.  Orthopedic: Normal muscle strength 5/5 to all lower extremity muscle groups bilaterally. No pain with passive/active ROM left hallux. Patient ambulates independent of any assistive aids.     Assessment:   1. Pain due to  onychomycosis of toenails of both feet   2. Type 1 diabetes mellitus with stage 3 chronic kidney disease, unspecified whether stage 3a or 3b CKD ([place])     Plan:  Patient was evaluated and treated and all questions answered. Consent given for treatment as described below: -Contusion left hallux appears to be resolving. Monitor. -Patient to continue soft, supportive shoe gear daily. -Toenails 1-5 b/l were debrided in length and girth with sterile nail nippers and dremel without iatrogenic bleeding.  -Patient to report any pedal injuries to medical professional immediately. -Patient/POA to call should there be question/concern in the interim.  Return in about 3 months (around 05/27/2021) for rfc with Dr. Elisha Ponder.  Lorenda Peck, MD

## 2021-03-08 DIAGNOSIS — M810 Age-related osteoporosis without current pathological fracture: Secondary | ICD-10-CM | POA: Diagnosis not present

## 2021-03-08 DIAGNOSIS — E039 Hypothyroidism, unspecified: Secondary | ICD-10-CM | POA: Diagnosis not present

## 2021-03-08 DIAGNOSIS — N1832 Chronic kidney disease, stage 3b: Secondary | ICD-10-CM | POA: Diagnosis not present

## 2021-03-08 DIAGNOSIS — E1069 Type 1 diabetes mellitus with other specified complication: Secondary | ICD-10-CM | POA: Diagnosis not present

## 2021-03-08 DIAGNOSIS — E1022 Type 1 diabetes mellitus with diabetic chronic kidney disease: Secondary | ICD-10-CM | POA: Diagnosis not present

## 2021-03-08 DIAGNOSIS — I129 Hypertensive chronic kidney disease with stage 1 through stage 4 chronic kidney disease, or unspecified chronic kidney disease: Secondary | ICD-10-CM | POA: Diagnosis not present

## 2021-03-08 DIAGNOSIS — E785 Hyperlipidemia, unspecified: Secondary | ICD-10-CM | POA: Diagnosis not present

## 2021-03-08 DIAGNOSIS — E1065 Type 1 diabetes mellitus with hyperglycemia: Secondary | ICD-10-CM | POA: Diagnosis not present

## 2021-03-12 DIAGNOSIS — R2689 Other abnormalities of gait and mobility: Secondary | ICD-10-CM | POA: Diagnosis not present

## 2021-03-12 DIAGNOSIS — H401133 Primary open-angle glaucoma, bilateral, severe stage: Secondary | ICD-10-CM | POA: Diagnosis not present

## 2021-03-12 DIAGNOSIS — E785 Hyperlipidemia, unspecified: Secondary | ICD-10-CM | POA: Diagnosis not present

## 2021-03-12 DIAGNOSIS — E10649 Type 1 diabetes mellitus with hypoglycemia without coma: Secondary | ICD-10-CM | POA: Diagnosis not present

## 2021-03-12 DIAGNOSIS — E1069 Type 1 diabetes mellitus with other specified complication: Secondary | ICD-10-CM | POA: Diagnosis not present

## 2021-03-12 DIAGNOSIS — E039 Hypothyroidism, unspecified: Secondary | ICD-10-CM | POA: Diagnosis not present

## 2021-03-12 DIAGNOSIS — E113593 Type 2 diabetes mellitus with proliferative diabetic retinopathy without macular edema, bilateral: Secondary | ICD-10-CM | POA: Diagnosis not present

## 2021-03-12 DIAGNOSIS — E1065 Type 1 diabetes mellitus with hyperglycemia: Secondary | ICD-10-CM | POA: Diagnosis not present

## 2021-03-12 DIAGNOSIS — I129 Hypertensive chronic kidney disease with stage 1 through stage 4 chronic kidney disease, or unspecified chronic kidney disease: Secondary | ICD-10-CM | POA: Diagnosis not present

## 2021-03-12 DIAGNOSIS — E1022 Type 1 diabetes mellitus with diabetic chronic kidney disease: Secondary | ICD-10-CM | POA: Diagnosis not present

## 2021-03-12 DIAGNOSIS — N1832 Chronic kidney disease, stage 3b: Secondary | ICD-10-CM | POA: Diagnosis not present

## 2021-03-12 DIAGNOSIS — H04123 Dry eye syndrome of bilateral lacrimal glands: Secondary | ICD-10-CM | POA: Diagnosis not present

## 2021-03-12 DIAGNOSIS — H524 Presbyopia: Secondary | ICD-10-CM | POA: Diagnosis not present

## 2021-03-20 DIAGNOSIS — E1042 Type 1 diabetes mellitus with diabetic polyneuropathy: Secondary | ICD-10-CM | POA: Insufficient documentation

## 2021-04-04 ENCOUNTER — Ambulatory Visit: Payer: Medicare Other | Admitting: Internal Medicine

## 2021-04-22 DIAGNOSIS — H401133 Primary open-angle glaucoma, bilateral, severe stage: Secondary | ICD-10-CM | POA: Diagnosis not present

## 2021-05-06 DIAGNOSIS — I493 Ventricular premature depolarization: Secondary | ICD-10-CM | POA: Diagnosis not present

## 2021-05-06 DIAGNOSIS — E113591 Type 2 diabetes mellitus with proliferative diabetic retinopathy without macular edema, right eye: Secondary | ICD-10-CM | POA: Diagnosis not present

## 2021-05-06 DIAGNOSIS — N183 Chronic kidney disease, stage 3 unspecified: Secondary | ICD-10-CM | POA: Diagnosis not present

## 2021-05-06 DIAGNOSIS — I10 Essential (primary) hypertension: Secondary | ICD-10-CM | POA: Diagnosis not present

## 2021-05-06 DIAGNOSIS — M8000XD Age-related osteoporosis with current pathological fracture, unspecified site, subsequent encounter for fracture with routine healing: Secondary | ICD-10-CM | POA: Diagnosis not present

## 2021-05-06 DIAGNOSIS — E039 Hypothyroidism, unspecified: Secondary | ICD-10-CM | POA: Diagnosis not present

## 2021-05-06 DIAGNOSIS — Z0000 Encounter for general adult medical examination without abnormal findings: Secondary | ICD-10-CM | POA: Diagnosis not present

## 2021-05-06 DIAGNOSIS — M5136 Other intervertebral disc degeneration, lumbar region: Secondary | ICD-10-CM | POA: Diagnosis not present

## 2021-05-06 DIAGNOSIS — R2689 Other abnormalities of gait and mobility: Secondary | ICD-10-CM | POA: Diagnosis not present

## 2021-05-06 DIAGNOSIS — E1122 Type 2 diabetes mellitus with diabetic chronic kidney disease: Secondary | ICD-10-CM | POA: Diagnosis not present

## 2021-05-06 DIAGNOSIS — E559 Vitamin D deficiency, unspecified: Secondary | ICD-10-CM | POA: Diagnosis not present

## 2021-05-28 ENCOUNTER — Ambulatory Visit: Payer: Medicare Other | Admitting: Podiatry

## 2021-05-28 ENCOUNTER — Other Ambulatory Visit: Payer: Self-pay

## 2021-05-28 ENCOUNTER — Encounter: Payer: Self-pay | Admitting: Podiatry

## 2021-05-28 DIAGNOSIS — M2142 Flat foot [pes planus] (acquired), left foot: Secondary | ICD-10-CM

## 2021-05-28 DIAGNOSIS — M2141 Flat foot [pes planus] (acquired), right foot: Secondary | ICD-10-CM

## 2021-05-28 DIAGNOSIS — B351 Tinea unguium: Secondary | ICD-10-CM

## 2021-05-28 DIAGNOSIS — M79675 Pain in left toe(s): Secondary | ICD-10-CM

## 2021-05-28 DIAGNOSIS — E119 Type 2 diabetes mellitus without complications: Secondary | ICD-10-CM

## 2021-05-28 DIAGNOSIS — M79674 Pain in right toe(s): Secondary | ICD-10-CM

## 2021-05-28 DIAGNOSIS — E1042 Type 1 diabetes mellitus with diabetic polyneuropathy: Secondary | ICD-10-CM | POA: Diagnosis not present

## 2021-05-29 ENCOUNTER — Ambulatory Visit: Payer: Medicare Other | Attending: Family Medicine

## 2021-05-29 DIAGNOSIS — R2681 Unsteadiness on feet: Secondary | ICD-10-CM | POA: Insufficient documentation

## 2021-05-29 DIAGNOSIS — M6281 Muscle weakness (generalized): Secondary | ICD-10-CM | POA: Insufficient documentation

## 2021-05-29 DIAGNOSIS — R262 Difficulty in walking, not elsewhere classified: Secondary | ICD-10-CM | POA: Diagnosis not present

## 2021-05-29 DIAGNOSIS — Z9181 History of falling: Secondary | ICD-10-CM | POA: Insufficient documentation

## 2021-05-29 NOTE — Therapy (Signed)
?OUTPATIENT PHYSICAL THERAPY LOWER EXTREMITY EVALUATION ? ? ?Patient Name: [name] Church ?MRN: [id number] ?DOB:1934-12-31, 87 y.o., female ?Today's Date: 05/29/2021 ? ? PT End of Session - 05/29/21 1411   ? ? Visit Number 1   ? Authorization Type [employer] Medicare   ? PT Start Time 1402   ? PT Stop Time 1455   ? PT Time Calculation (min) 53 min   ? Activity Tolerance Patient tolerated treatment well   ? Behavior During Therapy [facility] for tasks assessed/performed   ? ?  ?  ? ?  ? ? ?Past Medical History:  ?Diagnosis Date  ? Diabetes mellitus without complication ([place])   ? Edema   ? Elevated serum immunoglobulin free light chain level 11/29/2013  ? Macrocytosis without anemia 11/29/2013  ? Nonsustained ventricular tachycardia   ? 115 episodes of NSVT on mointor up to 6 beats in a row with maximum HR of 197bpm on heart monitor 06/2020  ? Osteoporosis   ? PAT (paroxysmal atrial tachycardia) ([place])   ? nonsustained atrial tachycardia up to 23 seconds at a time on heart monitor 06/2020  ? Thyroid disease   ? Unspecified deficiency anemia 11/29/2013  ? ?Past Surgical History:  ?Procedure Laterality Date  ? APPENDECTOMY    ? EYE SURGERY Left 03/2013  ? eye bleeds internally  ? EYE SURGERY Right 03/2013  ? FRACTURE SURGERY Left 2012  ? leg with metal fixation  ? ?Patient Active Problem List  ? Diagnosis Date Noted  ? History of falling 05/29/2021  ? Diabetic peripheral neuropathy associated with type 1 diabetes mellitus ([place]) 03/20/2021  ? Irregular heart beat 08/21/2020  ? PVC's (premature ventricular contractions) 07/04/2020  ? Allergic rhinitis 02/21/2020  ? Body mass index (BMI) 34.0-34.9, adult 02/21/2020  ? Callosity 02/21/2020  ? Diabetic renal disease ([place]) 02/21/2020  ? Disorder of the skin and subcutaneous tissue, unspecified 02/21/2020  ? Essential hypertension 02/21/2020  ? Impairment of balance 02/21/2020  ? Macrocytosis 02/21/2020  ? Onychomycosis 02/21/2020  ? Other intervertebral disc degeneration, lumbar region 02/21/2020   ? Pathological fracture 02/21/2020  ? Vitamin D deficiency 02/21/2020  ? Background diabetic retinopathy ([place]) 02/21/2020  ? Senile purpura ([place]) 02/21/2020  ? Stasis dermatitis 02/21/2020  ? CKD (chronic kidney disease) stage 3, GFR 30-59 ml/min (HCC) 10/08/2016  ? Osteoporosis, postmenopausal 04/08/2016  ? Primary hypothyroidism 04/08/2016  ? Type 1 diabetes mellitus with hyperglycemia ([place]) 04/08/2016  ? Type 1 diabetes mellitus with mild nonproliferative retinopathy of both eyes without macular edema (HCC) 04/08/2016  ? Elevated serum immunoglobulin free light chain level 11/29/2013  ? Macrocytosis without anemia 11/29/2013  ? Unspecified deficiency anemia 11/29/2013  ? Edema   ? ? ?PCP: Jonathon Jordan, MD ? ?REFERRING PROVIDER: Jonathon Jordan, MD ? ?REFERRING DIAG: R26.89 (ICD-10-CM) - Other abnormalities of gait and mobility  ? ?THERAPY DIAG:  ?Difficulty walking - Plan: PT plan of care cert/re-cert ? ?History of falling - Plan: PT plan of care cert/re-cert ? ?Unsteadiness on feet - Plan: PT plan of care cert/re-cert ? ?Muscle weakness (generalized) - Plan: PT plan of care cert/re-cert ? ?ONSET DATE: 03/03/21 ? ?SUBJECTIVE:  ? ?SUBJECTIVE STATEMENT: ?Patient states she suffered a left femur fracture 11 years ago.  She then fell about one year after that going down steps and fractured left foot.  She still has some issues with the left foot and feels unsteady.  She is using a rolling walker today but states she typically uses a cane or no assistive device  any other time.  She orders her groceries and just picks these up.  She is still driving.  She doesn't need to go anywhere else other than drug store to pick up meds.  She did say that she went to visit some relatives and had to walk a long way using her cane but usually when she does something like this her dtr is with her.  She just moved here from [place] to be near her dtr.  She lives alone in a single story home.  She states she is independent with  dressing and bathing and does have grab bars and shower stool if needed. She is able to do basic cooking and has a cleaning lady to come to do heavy cleaning.    ? ?PERTINENT HISTORY: ?See above; high fall risk ? ?PAIN:  ?Are you having pain? No ? ?PRECAUTIONS: Fall ? ?WEIGHT BEARING RESTRICTIONS No ? ?FALLS:  ?Has patient fallen in last 6 months? Yes. Number of falls 2 ? ?LIVING ENVIRONMENT: ?Lives with: lives alone ?Lives in: House/apartment ?Stairs: Yes: External: 1 steps; none ?Has following equipment at home: Single point cane, Walker - 2 wheeled, shower chair, and Grab bars ? ?OCCUPATION: retired ? ?PLOF: Independent ? ?PATIENT GOALS To be able to walk without any assistive device and to feel comfortable and legs not get weak.   ? ? ?OBJECTIVE:  ? ?DIAGNOSTIC FINDINGS: none ? ? ?COGNITION: ? Overall cognitive status: Within functional limits for tasks assessed   ?  ?SENSATION: ?WFL ? ? ?POSTURE:  ?Fwd bent, rounded shoulders ? ? ?LE ROM: ? ?WFL ? ?LE MMT: ? ?MMT Right ?05/29/2021 Left ?05/29/2021  ?Hip flexion 4-/5 4-/5  ?Hip extension 3+/5 3+/5  ?Hip abduction 3+/5 3+/5  ?Hip adduction 4/5 4/5  ?Hip internal rotation 3+/5 3+/5  ?Hip external rotation 3+/5 3+/5  ?Knee flexion 4/5 4/5  ?Knee extension 4/5 4/5  ?Ankle dorsiflexion 4/5 4/5  ?Ankle plantarflexion 4/5 4/5  ?Ankle inversion 4/5 4/5  ?Ankle eversion 4/5 4/5  ? (Blank rows = not tested) ? ? ?FUNCTIONAL TESTS:  ?5 times sit to stand: 26.52 ?Timed up and go (TUG): 25.75 ? ?GAIT: ?Distance walked: 50 feet ?Assistive device utilized: Environmental consultant - 2 wheeled ?Level of assistance: SBA ?Comments: short shuffle step, LE's externally rotated ? ? ? ?TODAY'S TREATMENT: ?Initial evaluation completed and initiated HEP ? ? ?PATIENT EDUCATION:  ?Education details: Gait training: educated on heel to toe progression and proper step length along with upright posture.  Also educated on appropriate assistive device and need to use regularly.  Suggested rollator walker due to  the varying surfaces in her home.  Educated on proper/safe sit to stand.   ?Person educated: Patient ?Education method: Explanation, Demonstration, and Verbal cues ?Education comprehension: verbalized understanding, returned demonstration, and verbal cues required ? ? ?HOME EXERCISE PROGRAM: ?Patient to work on proper step length with heel strike along with sit to stand x 5 every 2 hours.   ? ?ASSESSMENT: ? ?CLINICAL IMPRESSION: ?Patient is an 87 y.o. female who was seen today for physical therapy evaluation and treatment for balance and fall risk.  ? ? ?OBJECTIVE IMPAIRMENTS Abnormal gait, decreased activity tolerance, decreased balance, decreased endurance, decreased knowledge of condition, decreased knowledge of use of DME, decreased mobility, difficulty walking, decreased strength, decreased safety awareness, impaired flexibility, and postural dysfunction.  ? ?ACTIVITY LIMITATIONS cleaning, community activity, meal prep, laundry, yard work, and shopping.  ? ?PERSONAL FACTORS Age, Fitness, Past/current experiences, Time since onset of injury/illness/exacerbation, and 3+  comorbidities: htn, diabetes and obesity  are also affecting patient's functional outcome.  ? ? ?REHAB POTENTIAL: Fair due to multiple co-morbidities. ? ?CLINICAL DECISION MAKING: Evolving/moderate complexity ? ?EVALUATION COMPLEXITY: Moderate ? ? ?GOALS: ?Goals reviewed with patient? Yes ? ?SHORT TERM GOALS: Target date: 06/26/21 ? ?Patient will be independent with initial HEP  ?Baseline: ?Goal status: INITIAL ? ?2.  Patient to demonstrate consistent use of proper assistive device for reducing fall risk.  ?Baseline:  ?Goal status: INITIAL ? ?3.  Patient to be able to do 5 times sit to stand properly  ?Baseline:  ?Goal status: INITIAL ? ?4.  Patient to be able to walk 100 feet without rest break ?Baseline:  ?Goal status: INITIAL ? ? ?LONG TERM GOALS: Target date: 07/24/2021 ? ?Patient to be independent with advanced HEP  ?Baseline:  ?Goal status:  INITIAL ? ?2.  Patient to obtain proper assistive device and be able to use safely (rollator recommended) ?Baseline:  ?Goal status: INITIAL ? ?3.  Patient to be able to walk 300 feet with appropriate/safest a

## 2021-06-02 NOTE — Progress Notes (Signed)
ANNUAL DIABETIC FOOT EXAM ? ?Subjective: ?[name] Church presents today for annual diabetic foot examination. ? ?Patient relates 87 year h/o diabetes. ? ?Patient denies any h/o foot wounds. ? ?Patient endorses numbness, tingling and burning on occasion. ? ?Patient has been diagnosed with neuropathy. ? ?Patient's blood sugar was 172 mg/dl today. Last known HgA1c was 7.6%. ? ?Risk factors: diabetes, HTN, diabetic renal disease. ? ?Jonathon Jordan, MD is patient's PCP. Last visit was May 06, 2021. ? ?Past Medical History:  ?Diagnosis Date  ? Diabetes mellitus without complication ([place])   ? Edema   ? Elevated serum immunoglobulin free light chain level 11/29/2013  ? Macrocytosis without anemia 11/29/2013  ? Nonsustained ventricular tachycardia   ? 115 episodes of NSVT on mointor up to 6 beats in a row with maximum HR of 197bpm on heart monitor 06/2020  ? Osteoporosis   ? PAT (paroxysmal atrial tachycardia) ([place])   ? nonsustained atrial tachycardia up to 23 seconds at a time on heart monitor 06/2020  ? Thyroid disease   ? Unspecified deficiency anemia 11/29/2013  ? ?Patient Active Problem List  ? Diagnosis Date Noted  ? History of falling 05/29/2021  ? Diabetic peripheral neuropathy associated with type 1 diabetes mellitus ([place]) 03/20/2021  ? Irregular heart beat 08/21/2020  ? PVC's (premature ventricular contractions) 07/04/2020  ? Allergic rhinitis 02/21/2020  ? Body mass index (BMI) 34.0-34.9, adult 02/21/2020  ? Callosity 02/21/2020  ? Diabetic renal disease ([place]) 02/21/2020  ? Disorder of the skin and subcutaneous tissue, unspecified 02/21/2020  ? Essential hypertension 02/21/2020  ? Impairment of balance 02/21/2020  ? Macrocytosis 02/21/2020  ? Onychomycosis 02/21/2020  ? Other intervertebral disc degeneration, lumbar region 02/21/2020  ? Pathological fracture 02/21/2020  ? Vitamin D deficiency 02/21/2020  ? Background diabetic retinopathy ([place]) 02/21/2020  ? Senile purpura ([place]) 02/21/2020  ? Stasis dermatitis  02/21/2020  ? CKD (chronic kidney disease) stage 3, GFR 30-59 ml/min (HCC) 10/08/2016  ? Osteoporosis, postmenopausal 04/08/2016  ? Primary hypothyroidism 04/08/2016  ? Type 1 diabetes mellitus with hyperglycemia ([place]) 04/08/2016  ? Type 1 diabetes mellitus with mild nonproliferative retinopathy of both eyes without macular edema (HCC) 04/08/2016  ? Elevated serum immunoglobulin free light chain level 11/29/2013  ? Macrocytosis without anemia 11/29/2013  ? Unspecified deficiency anemia 11/29/2013  ? Edema   ? ?Past Surgical History:  ?Procedure Laterality Date  ? APPENDECTOMY    ? EYE SURGERY Left 03/2013  ? eye bleeds internally  ? EYE SURGERY Right 03/2013  ? FRACTURE SURGERY Left 2012  ? leg with metal fixation  ? ?Current Outpatient Medications on File Prior to Visit  ?Medication Sig Dispense Refill  ? Continuous Blood Gluc Receiver (DEXCOM G6 RECEIVER) DEVI Use as directed for continuous glucose monitoring.    ? Continuous Blood Gluc Sensor (DEXCOM G6 SENSOR) MISC Inject 1 sensor to the skin every 10 days for continuous glucose monitoring.    ? Continuous Blood Gluc Transmit (DEXCOM G6 TRANSMITTER) MISC Use as directed for continuous glucose monitoring. Reuse transmitter for 90 days then discard and replace.    ? ACCU-CHEK AVIVA PLUS test strip USE TO TEST BLOOD SUGARS QID  10  ? ACCU-CHEK FASTCLIX LANCETS MISC     ? aspirin 325 MG EC tablet Take 325 mg by mouth daily.    ? BD PEN NEEDLE NANO U/F 32G X 4 MM MISC USE AS DIRECTED 4-5 TIMES A DAY  4  ? Cholecalciferol 25 MCG (1000 UT) tablet Take by mouth.    ?  Continuous Blood Gluc Sensor (DEXCOM G6 SENSOR) MISC SMARTSIG:1 Topical Every 10 Days    ? Continuous Blood Gluc Transmit (DEXCOM G6 TRANSMITTER) MISC     ? dorzolamide (TRUSOPT) 2 % ophthalmic solution 1 drop into each eye    ? dorzolamide-timolol (COSOPT) 22.3-6.8 MG/ML ophthalmic solution Place 1 drop into both eyes 2 (two) times daily.    ? ferrous sulfate 325 (65 FE) MG tablet Take 1 tablet once a day     ? furosemide (LASIX) 20 MG tablet Take 20 mg by mouth.    ? HUMALOG KWIKPEN 100 UNIT/ML KiwkPen 2 (two) times daily.  5  ? hydrochlorothiazide (MICROZIDE) 12.5 MG capsule Take 12.5 mg by mouth daily.    ? Iron 66 MG TABS Take by mouth.    ? Lancet Devices ([employer] LANCING DEVICE) MISC     ? latanoprost (XALATAN) 0.005 % ophthalmic solution SMARTSIG:In Eye(s)    ? latanoprost (XALATAN) 0.005 % ophthalmic solution 1 drop into affected eye in the evening    ? levothyroxine (SYNTHROID) 137 MCG tablet Take 1 tablet by mouth every morning.    ? levothyroxine (SYNTHROID, LEVOTHROID) 137 MCG tablet Take 137 mcg by mouth daily before breakfast.    ? lisinopril (ZESTRIL) 5 MG tablet Take 1 tablet by mouth daily.    ? loratadine (CLARITIN) 10 MG tablet Take 1 tablet once a day as needed    ? metoprolol succinate (TOPROL XL) 25 MG 24 hr tablet Take 1 tablet (25 mg total) by mouth daily. 90 tablet 3  ? Multiple Minerals-Vitamins (CALCIUM & VIT D3 BONE HEALTH PO) Take by mouth.    ? Multiple Vitamin (MULTI-VITAMINS) TABS Take 1 tablet once a day    ? Multiple Vitamins-Minerals (MULTIVITAMIN WITH MINERALS) tablet Take 1 tablet by mouth daily.    ? mupirocin cream (BACTROBAN) 2 % Apply to left great toe once daily. 30 g 0  ? mupirocin ointment (BACTROBAN) 2 % 1 application    ? Netarsudil-Latanoprost (ROCKLATAN) 0.02-0.005 % SOLN 1 drop into affected eye    ? Oyster Shell Calcium 500 MG TABS 1 tablet with meals    ? TRESIBA FLEXTOUCH 200 UNIT/ML FlexTouch Pen SMARTSIG:14 Unit(s) SUB-Q Every Morning    ? Vitamin D, Cholecalciferol, 1000 UNITS TABS Take by mouth.    ? ?No current facility-administered medications on file prior to visit.  ?  ?Allergies  ?Allergen Reactions  ? Mexiletine   ?  Other reaction(s): Other (See Comments) ?Off balance   ? Sulfa Antibiotics   ? Sulfamethoxazole Rash  ? ?Social History  ? ?Occupational History  ? Not on file  ?Tobacco Use  ? Smoking status: Never  ? Smokeless tobacco: Never  ?Substance and Sexual  Activity  ? Alcohol use: No  ? Drug use: No  ? Sexual activity: Not on file  ? ?Family History  ?Problem Relation Age of Onset  ? Cancer Mother   ?     colon ca  ? Cancer Brother   ?     prostate ca  ? Cancer Brother   ?     lung ca  ? ?Immunization History  ?Administered Date(s) Administered  ? Influenza Split 12/02/2010, 12/22/2011, 01/21/2013, 01/10/2015, 10/28/2016, 12/24/2017, 11/24/2018  ? Influenza, High Dose Seasonal PF 11/09/2019  ? Influenza,inj,Quad PF,6+ Mos 10/28/2016, 12/24/2017  ? Moderna Sars-Covid-2 Vaccination 03/23/2019  ? PFIZER(Purple Top)SARS-COV-2 Vaccination 03/23/2019, 04/13/2019, 11/29/2019  ? Pneumococcal Conjugate-13 01/16/2015  ? Pneumococcal Polysaccharide-23 03/03/2008  ? Tdap 10/17/2011  ? Zoster, Live  03/03/2008  ?  ? ?Review of Systems: Negative except as noted in the HPI.  ? ?Objective: ?There were no vitals filed for this visit. ? ?YLIANNA ALMANZAR is a pleasant 87 y.o. female in NAD. AAO X 3. ? ?Vascular Examination: ?CFT <3 seconds b/l LE. Palpable DP pulse(s) b/l LE. Palpable PT pulse(s) b/l LE. Pedal hair sparse. No pain with calf compression RLE. Lower extremity skin temperature gradient within normal limits. ? ?Dermatological Examination: ?Pedal integument with normal turgor, texture and tone BLE. No open wounds b/l LE. No interdigital macerations noted b/l LE. Toenails 1-5 bilaterally elongated, discolored, dystrophic, thickened, and crumbly with subungual debris and tenderness to dorsal palpation. ? ?Neurological Examination: ?Protective sensation intact 5/5 intact bilaterally with 10g monofilament b/l. Vibratory sensation intact b/l. ? ?Musculoskeletal Examination: ?Muscle strength 5/5 to all lower extremity muscle groups bilaterally. No pain, crepitus or joint limitation noted with ROM bilateral LE. Pes planus deformity noted bilateral LE. ? ?Footwear Assessment: ?Does the patient wear appropriate shoes? Yes. ?Does the patient need inserts/orthotics?  No. ? ?Assessment: ?1. Pain due to onychomycosis of toenails of both feet   ?2. Pes planus of both feet   ?3. Diabetic peripheral neuropathy associated with type 1 diabetes mellitus ([place])   ?4. Encounter for diabetic foot exa

## 2021-06-05 ENCOUNTER — Ambulatory Visit: Payer: Medicare Other | Attending: Family Medicine | Admitting: Rehabilitative and Restorative Service Providers"

## 2021-06-05 ENCOUNTER — Encounter: Payer: Self-pay | Admitting: Rehabilitative and Restorative Service Providers"

## 2021-06-05 DIAGNOSIS — M6281 Muscle weakness (generalized): Secondary | ICD-10-CM | POA: Diagnosis not present

## 2021-06-05 DIAGNOSIS — R2681 Unsteadiness on feet: Secondary | ICD-10-CM | POA: Diagnosis not present

## 2021-06-05 DIAGNOSIS — R262 Difficulty in walking, not elsewhere classified: Secondary | ICD-10-CM | POA: Insufficient documentation

## 2021-06-05 DIAGNOSIS — Z9181 History of falling: Secondary | ICD-10-CM | POA: Diagnosis not present

## 2021-06-05 NOTE — Therapy (Signed)
?OUTPATIENT PHYSICAL THERAPY TREATMENT NOTE ? ? ?Patient Name: [name] Church ?MRN: [id number] ?DOB:[birth year], 87 y.o., female ?Today's Date: 06/05/2021 ? ?PCP: Jonathon Jordan, MD ?REFERRING PROVIDER: Jonathon Jordan, MD ? ?END OF SESSION:  ? PT End of Session - 06/05/21 1359   ? ? Visit Number 2   ? Authorization Type [employer] Medicare   ? PT Start Time 1400   ? PT Stop Time 1440   ? PT Time Calculation (min) 40 min   ? Activity Tolerance Patient tolerated treatment well   ? Behavior During Therapy [facility] for tasks assessed/performed   ? ?  ?  ? ?  ? ? ?Past Medical History:  ?Diagnosis Date  ? Diabetes mellitus without complication ([place])   ? Edema   ? Elevated serum immunoglobulin free light chain level 11/29/2013  ? Macrocytosis without anemia 11/29/2013  ? Nonsustained ventricular tachycardia ([place])   ? 115 episodes of NSVT on mointor up to 6 beats in a row with maximum HR of 197bpm on heart monitor 06/2020  ? Osteoporosis   ? PAT (paroxysmal atrial tachycardia) ([place])   ? nonsustained atrial tachycardia up to 23 seconds at a time on heart monitor 06/2020  ? Thyroid disease   ? Unspecified deficiency anemia 11/29/2013  ? ?Past Surgical History:  ?Procedure Laterality Date  ? APPENDECTOMY    ? EYE SURGERY Left 03/2013  ? eye bleeds internally  ? EYE SURGERY Right 03/2013  ? FRACTURE SURGERY Left 2012  ? leg with metal fixation  ? ?Patient Active Problem List  ? Diagnosis Date Noted  ? History of falling 05/29/2021  ? Diabetic peripheral neuropathy associated with type 1 diabetes mellitus ([place]) 03/20/2021  ? Irregular heart beat 08/21/2020  ? PVC's (premature ventricular contractions) 07/04/2020  ? Allergic rhinitis 02/21/2020  ? Body mass index (BMI) 34.0-34.9, adult 02/21/2020  ? Callosity 02/21/2020  ? Diabetic renal disease ([place]) 02/21/2020  ? Disorder of the skin and subcutaneous tissue, unspecified 02/21/2020  ? Essential hypertension 02/21/2020  ? Impairment of balance 02/21/2020  ? Macrocytosis 02/21/2020  ?  Onychomycosis 02/21/2020  ? Other intervertebral disc degeneration, lumbar region 02/21/2020  ? Pathological fracture 02/21/2020  ? Vitamin D deficiency 02/21/2020  ? Background diabetic retinopathy ([place]) 02/21/2020  ? Senile purpura ([place]) 02/21/2020  ? Stasis dermatitis 02/21/2020  ? CKD (chronic kidney disease) stage 3, GFR 30-59 ml/min (HCC) 10/08/2016  ? Osteoporosis, postmenopausal 04/08/2016  ? Primary hypothyroidism 04/08/2016  ? Type 1 diabetes mellitus with hyperglycemia ([place]) 04/08/2016  ? Type 1 diabetes mellitus with mild nonproliferative retinopathy of both eyes without macular edema (HCC) 04/08/2016  ? Elevated serum immunoglobulin free light chain level 11/29/2013  ? Macrocytosis without anemia 11/29/2013  ? Unspecified deficiency anemia 11/29/2013  ? Edema   ? ? ?REFERRING DIAG: R26.89 (ICD-10-CM) - Other abnormalities of gait and mobility  ? ?THERAPY DIAG:  ?History of falling ? ?Difficulty walking ? ?Unsteadiness on feet ? ?Muscle weakness (generalized) ? ?PERTINENT HISTORY: L femur Fx 11 years, then L fractured foot 10 years ago ? ?PRECAUTIONS: Fall risk ? ?SUBJECTIVE: Pt denies any new falls, states that she has been trying to do the walking like she was taught. ? ?PAIN:  ?Are you having pain? No ? ?PATIENT GOALS To be able to walk without any assistive device and to feel comfortable and legs not get weak.   ? ?OBJECTIVE:  ?  ?LE MMT: ?  ?MMT Right ?05/29/2021 Left ?05/29/2021  ?Hip flexion 4-/5 4-/5  ?Hip extension 3+/5  3+/5  ?Hip abduction 3+/5 3+/5  ?Hip adduction 4/5 4/5  ?Hip internal rotation 3+/5 3+/5  ?Hip external rotation 3+/5 3+/5  ?Knee flexion 4/5 4/5  ?Knee extension 4/5 4/5  ?Ankle dorsiflexion 4/5 4/5  ?Ankle plantarflexion 4/5 4/5  ?Ankle inversion 4/5 4/5  ?Ankle eversion 4/5 4/5  ? (Blank rows = not tested) ?  ?  ?FUNCTIONAL TESTS:  ? ?05/29/2021: ?5 times sit to stand: 26.52 ?Timed up and go (TUG): 25.75 ?  ?GAIT: ?06/05/2021: ?3 min walk:  259 ft with RW ?Comments: short shuffle  step, LE's externally rotated, heavy reliance on RW ?  ?  ?  ?TODAY'S TREATMENT: ? ?06/05/2021: ?Nustep L1 x6 min, PT present to discuss status ?Seated with 1# ankle weight:  LAQ, marching, hip abduction scissors, clamshells with red tband, HS curl with red tband.  BLE 2x10 each ?Standing with UE support:  marching, hip flexion, hip abduction, hip extension.  BLE x10 reps ?Ambulation in PT gym with RW for 3 min walk test. ?Reviewed assistive device of RW vs 4WRW.  Educated pt on benefits of each and advised her to go to test one at a medical supply store to see if she would be able to lift in/out of her car.  She verbalized her understanding. ? ?05/29/2021: Initial evaluation completed and initiated HEP ?  ?  ?PATIENT EDUCATION:  ?Education details: Gait training: educated on heel to toe progression and proper step length along with upright posture.  Also educated on appropriate assistive device and need to use regularly.  Suggested rollator walker due to the varying surfaces in her home.  Educated on proper/safe sit to stand.   ?Person educated: Patient ?Education method: Explanation, Demonstration, and Verbal cues ?Education comprehension: verbalized understanding, returned demonstration, and verbal cues required ?  ?  ?HOME EXERCISE PROGRAM: ?Patient to work on proper step length with heel strike along with sit to stand x 5 every 2 hours.   ? ?Access Code: [email or web address] ?URL: [email or web address] ?Date: 06/05/2021 ?Prepared by: Juel Burrow ? ?Exercises ?- Seated Long Arc Quad  - 1 x daily - 7 x weekly - 2 sets - 10 reps ?- Seated March  - 1 x daily - 7 x weekly - 2 sets - 10 reps ?- Sit to Stand  - 1 x daily - 7 x weekly - 2 sets - 10 reps ?- Standing March with Counter Support  - 1 x daily - 7 x weekly - 2 sets - 10 reps ?- Standing Hip Abduction with Counter Support  - 1 x daily - 7 x weekly - 2 sets - 10 reps ?- Standing Hip Extension with Counter Support  - 1 x daily - 7 x weekly - 2 sets - 10  reps ?- Standing Hip Flexion with Counter Support  - 1 x daily - 7 x weekly - 2 sets - 10 reps ?  ?ASSESSMENT: ?  ?CLINICAL IMPRESSION: ?Ms Aina presents to PT reporting that she has been working on her ambulation and is ready to participate in PT today. Pt able to begin strengthening exercises today and provided with HEP.  Pt continues to require cuing during ambulation for improved technique. ?  ?  ?OBJECTIVE IMPAIRMENTS Abnormal gait, decreased activity tolerance, decreased balance, decreased endurance, decreased knowledge of condition, decreased knowledge of use of DME, decreased mobility, difficulty walking, decreased strength, decreased safety awareness, impaired flexibility, and postural dysfunction.  ?  ?ACTIVITY LIMITATIONS cleaning, community activity, meal prep, laundry, yard work, and shopping.  ?  ?  PERSONAL FACTORS Age, Fitness, Past/current experiences, Time since onset of injury/illness/exacerbation, and 3+ comorbidities: htn, diabetes and obesity  are also affecting patient's functional outcome.  ?  ?  ?REHAB POTENTIAL: Fair due to multiple co-morbidities. ?  ?CLINICAL DECISION MAKING: Evolving/moderate complexity ?  ?EVALUATION COMPLEXITY: Moderate ?  ?  ?GOALS: ?Goals reviewed with patient? Yes ?  ?SHORT TERM GOALS: Target date: 06/26/21 ?  ?Patient will be independent with initial HEP  ?Baseline: ?Goal status: INITIAL ?  ?2.  Patient to demonstrate consistent use of proper assistive device for reducing fall risk.  ?Baseline:  ?Goal status: INITIAL ?  ?3.  Patient to be able to do 5 times sit to stand properly  ?Baseline:  ?Goal status: INITIAL ?  ?4.  Patient to be able to walk 100 feet without rest break ?Baseline:  ?Goal status: INITIAL ?  ?  ?LONG TERM GOALS: Target date: 07/24/2021 ?  ?Patient to be independent with advanced HEP  ?Baseline:  ?Goal status: INITIAL ?  ?2.  Patient to obtain proper assistive device and be able to use safely (rollator recommended) ?Baseline:  ?Goal status: INITIAL ?   ?3.  Patient to be able to walk 300 feet with appropriate/safest assistive device without rest ?Baseline:  ?Goal status: INITIAL ?  ?4.  Patient functional tests to improve as follows: 5 times sit to stand to 22 sec,

## 2021-06-06 ENCOUNTER — Other Ambulatory Visit: Payer: Self-pay | Admitting: Cardiology

## 2021-06-12 ENCOUNTER — Ambulatory Visit: Payer: Medicare Other

## 2021-06-12 DIAGNOSIS — R262 Difficulty in walking, not elsewhere classified: Secondary | ICD-10-CM | POA: Diagnosis not present

## 2021-06-12 DIAGNOSIS — M6281 Muscle weakness (generalized): Secondary | ICD-10-CM | POA: Diagnosis not present

## 2021-06-12 DIAGNOSIS — R2681 Unsteadiness on feet: Secondary | ICD-10-CM

## 2021-06-12 DIAGNOSIS — Z9181 History of falling: Secondary | ICD-10-CM

## 2021-06-12 NOTE — Therapy (Signed)
?OUTPATIENT PHYSICAL THERAPY TREATMENT NOTE ? ? ?Patient Name: [name] Church ?MRN: [id number] ?DOB:1934-09-08, 87 y.o., female ?Today's Date: 06/12/2021 ? ?PCP: Jonathon Jordan, MD ?REFERRING PROVIDER: Jonathon Jordan, MD ? ?END OF SESSION:  ? PT End of Session - 06/12/21 1401   ? ? Visit Number 3   ? Authorization Type UHC Medicare   ? PT Start Time 4098   ? PT Stop Time 1191   ? PT Time Calculation (min) 44 min   ? Activity Tolerance Patient tolerated treatment well   ? Behavior During Therapy [facility] for tasks assessed/performed   ? ?  ?  ? ?  ? ? ?Past Medical History:  ?Diagnosis Date  ? Diabetes mellitus without complication ([place])   ? Edema   ? Elevated serum immunoglobulin free light chain level 11/29/2013  ? Macrocytosis without anemia 11/29/2013  ? Nonsustained ventricular tachycardia ([place])   ? 115 episodes of NSVT on mointor up to 6 beats in a row with maximum HR of 197bpm on heart monitor 06/2020  ? Osteoporosis   ? PAT (paroxysmal atrial tachycardia) ([place])   ? nonsustained atrial tachycardia up to 23 seconds at a time on heart monitor 06/2020  ? Thyroid disease   ? Unspecified deficiency anemia 11/29/2013  ? ?Past Surgical History:  ?Procedure Laterality Date  ? APPENDECTOMY    ? EYE SURGERY Left 03/2013  ? eye bleeds internally  ? EYE SURGERY Right 03/2013  ? FRACTURE SURGERY Left 2012  ? leg with metal fixation  ? ?Patient Active Problem List  ? Diagnosis Date Noted  ? History of falling 05/29/2021  ? Diabetic peripheral neuropathy associated with type 1 diabetes mellitus ([place]) 03/20/2021  ? Irregular heart beat 08/21/2020  ? PVC's (premature ventricular contractions) 07/04/2020  ? Allergic rhinitis 02/21/2020  ? Body mass index (BMI) 34.0-34.9, adult 02/21/2020  ? Callosity 02/21/2020  ? Diabetic renal disease ([place]) 02/21/2020  ? Disorder of the skin and subcutaneous tissue, unspecified 02/21/2020  ? Essential hypertension 02/21/2020  ? Impairment of balance 02/21/2020  ? Macrocytosis 02/21/2020  ?  Onychomycosis 02/21/2020  ? Other intervertebral disc degeneration, lumbar region 02/21/2020  ? Pathological fracture 02/21/2020  ? Vitamin D deficiency 02/21/2020  ? Background diabetic retinopathy ([place]) 02/21/2020  ? Senile purpura ([place]) 02/21/2020  ? Stasis dermatitis 02/21/2020  ? CKD (chronic kidney disease) stage 3, GFR 30-59 ml/min (HCC) 10/08/2016  ? Osteoporosis, postmenopausal 04/08/2016  ? Primary hypothyroidism 04/08/2016  ? Type 1 diabetes mellitus with hyperglycemia ([place]) 04/08/2016  ? Type 1 diabetes mellitus with mild nonproliferative retinopathy of both eyes without macular edema (HCC) 04/08/2016  ? Elevated serum immunoglobulin free light chain level 11/29/2013  ? Macrocytosis without anemia 11/29/2013  ? Unspecified deficiency anemia 11/29/2013  ? Edema   ? ? ?REFERRING DIAG: R26.89 (ICD-10-CM) - Other abnormalities of gait and mobility  ? ?THERAPY DIAG:  ?History of falling ? ?Difficulty walking ? ?Unsteadiness on feet ? ?Muscle weakness (generalized) ? ?PERTINENT HISTORY: L femur Fx 11 years, then L fractured foot 10 years ago ? ?PRECAUTIONS: Fall risk ? ?SUBJECTIVE: Pt reports she is doing well.  Working on her walking.  ? ?PAIN:  ?Are you having pain? No ? ?PATIENT GOALS To be able to walk without any assistive device and to feel comfortable and legs not get weak.   ? ?OBJECTIVE:  ?  ?LE MMT: ?  ?MMT Right ?05/29/2021 Left ?05/29/2021  ?Hip flexion 4-/5 4-/5  ?Hip extension 3+/5 3+/5  ?Hip abduction 3+/5 3+/5  ?  Hip adduction 4/5 4/5  ?Hip internal rotation 3+/5 3+/5  ?Hip external rotation 3+/5 3+/5  ?Knee flexion 4/5 4/5  ?Knee extension 4/5 4/5  ?Ankle dorsiflexion 4/5 4/5  ?Ankle plantarflexion 4/5 4/5  ?Ankle inversion 4/5 4/5  ?Ankle eversion 4/5 4/5  ? (Blank rows = not tested) ?  ?  ?FUNCTIONAL TESTS:  ? ?05/29/2021: ?5 times sit to stand: 26.52 ?Timed up and go (TUG): 25.75 ?  ?GAIT: ?06/05/2021: ?3 min walk:  259 ft with RW ?Comments: short shuffle step, LE's externally rotated, heavy  reliance on RW ?  ?  ?  ?TODAY'S TREATMENT: ? ?06/12/2021: ?Nustep L5 x6 min, PT present to discuss status ?Standing with UE support at bar:  marching, hip abduction, hip extension.  BLE x10 reps each LE ?Sit to stand x 5 with UE support,  2nd set of 5 with green loop for hip abduction x 5 ?Seated clam with green loop x 20 ?Standing band walks at bar with UE support 3 laps with green loop ?Seated with 3# ankle weight:  LAQ, marching, HS curl with red tband.  BLE 2x10 each ?Reviewed again, assistive device of RW vs 4WRW.  Discussed definite need of a new 2 wheel walker but to hold on 4 wheel walker.  PT will try to get a loaner and allow her to see how she feels with this and to see if she feels she could lift it in/out of her car.   ? ?06/05/2021: ?Nustep L1 x6 min, PT present to discuss status ?Seated with 1# ankle weight:  LAQ, marching, hip abduction scissors, clamshells with red tband, HS curl with red tband.  BLE 2x10 each ?Standing with UE support:  marching, hip flexion, hip abduction, hip extension.  BLE x10 reps ?Ambulation in PT gym with RW for 3 min walk test. ?Reviewed assistive device of RW vs 4WRW.  Educated pt on benefits of each and advised her to go to test one at a medical supply store to see if she would be able to lift in/out of her car.  She verbalized her understanding. ? ?05/29/2021: Initial evaluation completed and initiated HEP ?  ?  ?PATIENT EDUCATION:  ?Education details: Gait training: educated on heel to toe progression and proper step length along with upright posture.  Also educated on appropriate assistive device and need to use regularly.  Suggested rollator walker due to the varying surfaces in her home.  Educated on proper/safe sit to stand.   ?Person educated: Patient ?Education method: Explanation, Demonstration, and Verbal cues ?Education comprehension: verbalized understanding, returned demonstration, and verbal cues required ?  ?  ?HOME EXERCISE PROGRAM: ?Patient to work on proper  step length with heel strike along with sit to stand x 5 every 2 hours.   ? ?Access Code: [email or web address] ?URL: [email or web address] ?Date: 06/05/2021 ?Prepared by: Juel Burrow ? ?Exercises ?- Seated Long Arc Quad  - 1 x daily - 7 x weekly - 2 sets - 10 reps ?- Seated March  - 1 x daily - 7 x weekly - 2 sets - 10 reps ?- Sit to Stand  - 1 x daily - 7 x weekly - 2 sets - 10 reps ?- Standing March with Counter Support  - 1 x daily - 7 x weekly - 2 sets - 10 reps ?- Standing Hip Abduction with Counter Support  - 1 x daily - 7 x weekly - 2 sets - 10 reps ?- Standing Hip Extension with Counter Support  - 1 x  daily - 7 x weekly - 2 sets - 10 reps ?- Standing Hip Flexion with Counter Support  - 1 x daily - 7 x weekly - 2 sets - 10 reps ?  ?ASSESSMENT: ?  ?CLINICAL IMPRESSION: ?Ms Roarty is tolerating additional reps and resistance on therex with minimal fatigue.   She continues to require cuing during ambulation for improved step length and heel to toe progression. She is well motivated and compliant with her HEP.  She would benefit from continued skilled PT for LE strengthening, gait and balance training to reduce fall risk.    ?  ?OBJECTIVE IMPAIRMENTS Abnormal gait, decreased activity tolerance, decreased balance, decreased endurance, decreased knowledge of condition, decreased knowledge of use of DME, decreased mobility, difficulty walking, decreased strength, decreased safety awareness, impaired flexibility, and postural dysfunction.  ?  ?ACTIVITY LIMITATIONS cleaning, community activity, meal prep, laundry, yard work, and shopping.  ?  ?PERSONAL FACTORS Age, Fitness, Past/current experiences, Time since onset of injury/illness/exacerbation, and 3+ comorbidities: htn, diabetes and obesity  are also affecting patient's functional outcome.  ?  ?  ?REHAB POTENTIAL: Fair due to multiple co-morbidities. ?  ?CLINICAL DECISION MAKING: Evolving/moderate complexity ?  ?EVALUATION COMPLEXITY: Moderate ?  ?   ?GOALS: ?Goals reviewed with patient? Yes ?  ?SHORT TERM GOALS: Target date: 06/26/21 ?  ?Patient will be independent with initial HEP  ?Baseline: ?Goal status: INITIAL ?  ?2.  Patient to demonstrate consistent use of proper as

## 2021-06-14 ENCOUNTER — Ambulatory Visit: Payer: Medicare Other | Admitting: Physical Therapy

## 2021-06-14 DIAGNOSIS — Z9181 History of falling: Secondary | ICD-10-CM | POA: Diagnosis not present

## 2021-06-14 DIAGNOSIS — M6281 Muscle weakness (generalized): Secondary | ICD-10-CM | POA: Diagnosis not present

## 2021-06-14 DIAGNOSIS — R2681 Unsteadiness on feet: Secondary | ICD-10-CM | POA: Diagnosis not present

## 2021-06-14 DIAGNOSIS — R262 Difficulty in walking, not elsewhere classified: Secondary | ICD-10-CM

## 2021-06-14 NOTE — Therapy (Signed)
?OUTPATIENT PHYSICAL THERAPY TREATMENT NOTE ? ? ?Patient Name: [name] Church ?MRN: [id number] ?DOB:1934-10-20, 87 y.o., female ?Today's Date: 06/14/2021 ? ?PCP: Jonathon Jordan, MD ?REFERRING PROVIDER: Jonathon Jordan, MD ? ?END OF SESSION:  ? PT End of Session - 06/14/21 1105   ? ? Visit Number 4   ? Authorization Type UHC Medicare   ? PT Start Time 1100   ? PT Stop Time 1145   ? PT Time Calculation (min) 45 min   ? Activity Tolerance Patient tolerated treatment well   ? ?  ?  ? ?  ? ? ?Past Medical History:  ?Diagnosis Date  ? Diabetes mellitus without complication ([place])   ? Edema   ? Elevated serum immunoglobulin free light chain level 11/29/2013  ? Macrocytosis without anemia 11/29/2013  ? Nonsustained ventricular tachycardia ([place])   ? 115 episodes of NSVT on mointor up to 6 beats in a row with maximum HR of 197bpm on heart monitor 06/2020  ? Osteoporosis   ? PAT (paroxysmal atrial tachycardia) ([place])   ? nonsustained atrial tachycardia up to 23 seconds at a time on heart monitor 06/2020  ? Thyroid disease   ? Unspecified deficiency anemia 11/29/2013  ? ?Past Surgical History:  ?Procedure Laterality Date  ? APPENDECTOMY    ? EYE SURGERY Left 03/2013  ? eye bleeds internally  ? EYE SURGERY Right 03/2013  ? FRACTURE SURGERY Left 2012  ? leg with metal fixation  ? ?Patient Active Problem List  ? Diagnosis Date Noted  ? History of falling 05/29/2021  ? Diabetic peripheral neuropathy associated with type 1 diabetes mellitus ([place]) 03/20/2021  ? Irregular heart beat 08/21/2020  ? PVC's (premature ventricular contractions) 07/04/2020  ? Allergic rhinitis 02/21/2020  ? Body mass index (BMI) 34.0-34.9, adult 02/21/2020  ? Callosity 02/21/2020  ? Diabetic renal disease ([place]) 02/21/2020  ? Disorder of the skin and subcutaneous tissue, unspecified 02/21/2020  ? Essential hypertension 02/21/2020  ? Impairment of balance 02/21/2020  ? Macrocytosis 02/21/2020  ? Onychomycosis 02/21/2020  ? Other intervertebral disc degeneration,  lumbar region 02/21/2020  ? Pathological fracture 02/21/2020  ? Vitamin D deficiency 02/21/2020  ? Background diabetic retinopathy ([place]) 02/21/2020  ? Senile purpura ([place]) 02/21/2020  ? Stasis dermatitis 02/21/2020  ? CKD (chronic kidney disease) stage 3, GFR 30-59 ml/min (HCC) 10/08/2016  ? Osteoporosis, postmenopausal 04/08/2016  ? Primary hypothyroidism 04/08/2016  ? Type 1 diabetes mellitus with hyperglycemia ([place]) 04/08/2016  ? Type 1 diabetes mellitus with mild nonproliferative retinopathy of both eyes without macular edema (HCC) 04/08/2016  ? Elevated serum immunoglobulin free light chain level 11/29/2013  ? Macrocytosis without anemia 11/29/2013  ? Unspecified deficiency anemia 11/29/2013  ? Edema   ? ? ?REFERRING DIAG: R26.89 (ICD-10-CM) - Other abnormalities of gait and mobility  ? ?THERAPY DIAG:  ?History of falling ? ?Difficulty walking ? ?Unsteadiness on feet ? ?Muscle weakness (generalized) ? ?PERTINENT HISTORY: L femur Fx 11 years, then L fractured foot 10 years ago ? ?PRECAUTIONS: Fall risk ? ?SUBJECTIVE: Using a different RW but has difficulty opening and closing it to get in/out of the car.  Did fine after last time.  States she doesn't feel her balance is good on inclines and declines and uneven sidewalks.  ? ?PAIN:  ?Are you having pain? No ? ?PATIENT GOALS To be able to walk without any assistive device and to feel comfortable and legs not get weak.   ? ?OBJECTIVE:  ?  ?LE MMT: ?  ?MMT Right ?05/29/2021  Left ?05/29/2021  ?Hip flexion 4-/5 4-/5  ?Hip extension 3+/5 3+/5  ?Hip abduction 3+/5 3+/5  ?Hip adduction 4/5 4/5  ?Hip internal rotation 3+/5 3+/5  ?Hip external rotation 3+/5 3+/5  ?Knee flexion 4/5 4/5  ?Knee extension 4/5 4/5  ?Ankle dorsiflexion 4/5 4/5  ?Ankle plantarflexion 4/5 4/5  ?Ankle inversion 4/5 4/5  ?Ankle eversion 4/5 4/5  ? (Blank rows = not tested) ?  ?  ?FUNCTIONAL TESTS:  ? ?05/29/2021: ?5 times sit to stand: 26.52 ?Timed up and go (TUG): 25.75 ?  ?GAIT: ?06/05/2021: ?3 min  walk:  259 ft with RW ?Comments: short shuffle step, LE's externally rotated, heavy reliance on RW ?  ?  ?  ?TODAY'S TREATMENT: ?06/14/2021: ?Nustep L5 x8 min, PT present to discuss status ?Seated: over low hurdle leg lifts 10x each; red plyo ball chops 10x each way  ?Standing with UE support at counter:  heel raises, hip abduction to step over low object, UE reaches to circles on counter; LE reaches to circles on floor ?Side stepping and forward/back stepping to floor circles ?Gait practice to increase stride length: "take fewer steps on 2nd attempt" ?Sit to stand x 5 without UE support,  2nd set of 5 holding 2# weighted ball ? ?06/12/2021: ?Nustep L5 x6 min, PT present to discuss status ?Standing with UE support at bar:  marching, hip abduction, hip extension.  BLE x10 reps each LE ?Sit to stand x 5 with UE support,  2nd set of 5 with green loop for hip abduction x 5 ?Seated clam with green loop x 20 ?Standing band walks at bar with UE support 3 laps with green loop ?Seated with 3# ankle weight:  LAQ, marching, HS curl with red tband.  BLE 2x10 each ?Reviewed again, assistive device of RW vs 4WRW.  Discussed definite need of a new 2 wheel walker but to hold on 4 wheel walker.  PT will try to get a loaner and allow her to see how she feels with this and to see if she feels she could lift it in/out of her car.   ? ?06/05/2021: ?Nustep L1 x6 min, PT present to discuss status ?Seated with 1# ankle weight:  LAQ, marching, hip abduction scissors, clamshells with red tband, HS curl with red tband.  BLE 2x10 each ?Standing with UE support:  marching, hip flexion, hip abduction, hip extension.  BLE x10 reps ?Ambulation in PT gym with RW for 3 min walk test. ?Reviewed assistive device of RW vs 4WRW.  Educated pt on benefits of each and advised her to go to test one at a medical supply store to see if she would be able to lift in/out of her car.  She verbalized her understanding. ? ?05/29/2021: Initial evaluation completed and  initiated HEP ?  ?  ?PATIENT EDUCATION:  ?Education details: Gait training: educated on heel to toe progression and proper step length along with upright posture.  Also educated on appropriate assistive device and need to use regularly.  Suggested rollator walker due to the varying surfaces in her home.  Educated on proper/safe sit to stand.   ?Person educated: Patient ?Education method: Explanation, Demonstration, and Verbal cues ?Education comprehension: verbalized understanding, returned demonstration, and verbal cues required ?  ?  ?HOME EXERCISE PROGRAM: ?Patient to work on proper step length with heel strike along with sit to stand x 5 every 2 hours.   ? ?Access Code: [email or web address] ?URL: [email or web address] ?Date: 06/05/2021 ?Prepared by: Juel Burrow ? ?Exercises ?- Seated  Long Arc Quad  - 1 x daily - 7 x weekly - 2 sets - 10 reps ?- Seated March  - 1 x daily - 7 x weekly - 2 sets - 10 reps ?- Sit to Stand  - 1 x daily - 7 x weekly - 2 sets - 10 reps ?- Standing March with Counter Support  - 1 x daily - 7 x weekly - 2 sets - 10 reps ?- Standing Hip Abduction with Counter Support  - 1 x daily - 7 x weekly - 2 sets - 10 reps ?- Standing Hip Extension with Counter Support  - 1 x daily - 7 x weekly - 2 sets - 10 reps ?- Standing Hip Flexion with Counter Support  - 1 x daily - 7 x weekly - 2 sets - 10 reps ?  ?ASSESSMENT: ?  ?CLINICAL IMPRESSION: ?Patient does well with 2 hand support balance activities and single arm reaching but has great difficulty with LE movements with only single arm support.  Able to improve foot clearance and stride length with gait practice with cue to "try to take fewer steps on the next attempt."   Close supervision for safety.   She would benefit from continued skilled PT for LE strengthening, gait and balance training to reduce fall risk.    ?  ?OBJECTIVE IMPAIRMENTS Abnormal gait, decreased activity tolerance, decreased balance, decreased endurance, decreased knowledge of  condition, decreased knowledge of use of DME, decreased mobility, difficulty walking, decreased strength, decreased safety awareness, impaired flexibility, and postural dysfunction.  ?  ?ACTIVITY LIMITATIONS

## 2021-06-17 ENCOUNTER — Ambulatory Visit: Payer: Medicare Other | Admitting: Internal Medicine

## 2021-06-17 ENCOUNTER — Encounter: Payer: Self-pay | Admitting: Internal Medicine

## 2021-06-17 ENCOUNTER — Ambulatory Visit: Payer: Medicare Other

## 2021-06-17 VITALS — BP 146/52 | HR 83 | Ht 64.0 in | Wt 189.4 lb

## 2021-06-17 DIAGNOSIS — Z9181 History of falling: Secondary | ICD-10-CM

## 2021-06-17 DIAGNOSIS — I493 Ventricular premature depolarization: Secondary | ICD-10-CM | POA: Diagnosis not present

## 2021-06-17 DIAGNOSIS — M6281 Muscle weakness (generalized): Secondary | ICD-10-CM

## 2021-06-17 DIAGNOSIS — R2681 Unsteadiness on feet: Secondary | ICD-10-CM

## 2021-06-17 DIAGNOSIS — I10 Essential (primary) hypertension: Secondary | ICD-10-CM | POA: Diagnosis not present

## 2021-06-17 DIAGNOSIS — R262 Difficulty in walking, not elsewhere classified: Secondary | ICD-10-CM | POA: Diagnosis not present

## 2021-06-17 NOTE — Progress Notes (Signed)
? ? ? ? ?HPI ?[name] Church returns today for followup. She is a pleasant 87 yo woman with a h/o HTN, who was seen by me for evaluation of PVC's. She was initially placed on Mexitil which was ineffective and then placed on low dose flecainide. She stopped flecainide after it made her feel bad. She previously felt her heart beating irregularly when she layed down but she now thinks that she is doing better. The patient actually has not felt much in the way of palpitations recently. No syncope or CHF.  ?Allergies  ?Allergen Reactions  ? Mexiletine   ?  Other reaction(s): Other (See Comments) ?Off balance   ? Sulfa Antibiotics   ? Sulfamethoxazole Rash  ? ? ? ?Current Outpatient Medications  ?Medication Sig Dispense Refill  ? ACCU-CHEK AVIVA PLUS test strip USE TO TEST BLOOD SUGARS QID  10  ? ACCU-CHEK FASTCLIX LANCETS MISC     ? aspirin 325 MG EC tablet Take 325 mg by mouth daily.    ? BD PEN NEEDLE NANO U/F 32G X 4 MM MISC USE AS DIRECTED 4-5 TIMES A DAY  4  ? Cholecalciferol 25 MCG (1000 UT) tablet Take by mouth.    ? Continuous Blood Gluc Receiver (DEXCOM G6 RECEIVER) DEVI Use as directed for continuous glucose monitoring.    ? Continuous Blood Gluc Sensor (DEXCOM G6 SENSOR) MISC SMARTSIG:1 Topical Every 10 Days    ? Continuous Blood Gluc Sensor (DEXCOM G6 SENSOR) MISC Inject 1 sensor to the skin every 10 days for continuous glucose monitoring.    ? Continuous Blood Gluc Transmit (DEXCOM G6 TRANSMITTER) MISC     ? Continuous Blood Gluc Transmit (DEXCOM G6 TRANSMITTER) MISC Use as directed for continuous glucose monitoring. Reuse transmitter for 90 days then discard and replace.    ? dorzolamide (TRUSOPT) 2 % ophthalmic solution 1 drop into each eye    ? dorzolamide-timolol (COSOPT) 22.3-6.8 MG/ML ophthalmic solution Place 1 drop into both eyes 2 (two) times daily.    ? ferrous sulfate 325 (65 FE) MG tablet Take 1 tablet once a day    ? furosemide (LASIX) 20 MG tablet Take 20 mg by mouth.    ? HUMALOG KWIKPEN 100  UNIT/ML KiwkPen 2 (two) times daily.  5  ? hydrochlorothiazide (MICROZIDE) 12.5 MG capsule Take 12.5 mg by mouth daily.    ? Iron 66 MG TABS Take by mouth.    ? Lancet Devices ([employer] LANCING DEVICE) MISC     ? latanoprost (XALATAN) 0.005 % ophthalmic solution SMARTSIG:In Eye(s)    ? latanoprost (XALATAN) 0.005 % ophthalmic solution 1 drop into affected eye in the evening    ? levothyroxine (SYNTHROID) 137 MCG tablet Take 1 tablet by mouth every morning.    ? levothyroxine (SYNTHROID, LEVOTHROID) 137 MCG tablet Take 137 mcg by mouth daily before breakfast.    ? lisinopril (ZESTRIL) 5 MG tablet Take 1 tablet by mouth daily.    ? loratadine (CLARITIN) 10 MG tablet Take 1 tablet once a day as needed    ? metoprolol succinate (TOPROL-XL) 25 MG 24 hr tablet Take 1 tablet (25 mg total) by mouth daily. 90 tablet 1  ? Multiple Minerals-Vitamins (CALCIUM & VIT D3 BONE HEALTH PO) Take by mouth.    ? Multiple Vitamin (MULTI-VITAMINS) TABS Take 1 tablet once a day    ? Multiple Vitamins-Minerals (MULTIVITAMIN WITH MINERALS) tablet Take 1 tablet by mouth daily.    ? mupirocin cream (BACTROBAN) 2 % Apply to left  great toe once daily. 30 g 0  ? mupirocin ointment (BACTROBAN) 2 % 1 application    ? Netarsudil-Latanoprost (ROCKLATAN) 0.02-0.005 % SOLN 1 drop into affected eye    ? Oyster Shell Calcium 500 MG TABS 1 tablet with meals    ? TRESIBA FLEXTOUCH 200 UNIT/ML FlexTouch Pen SMARTSIG:14 Unit(s) SUB-Q Every Morning    ? Vitamin D, Cholecalciferol, 1000 UNITS TABS Take by mouth.    ? ?No current facility-administered medications for this visit.  ? ? ? ?Past Medical History:  ?Diagnosis Date  ? Diabetes mellitus without complication ([place])   ? Edema   ? Elevated serum immunoglobulin free light chain level 11/29/2013  ? Macrocytosis without anemia 11/29/2013  ? Nonsustained ventricular tachycardia ([place])   ? 115 episodes of NSVT on mointor up to 6 beats in a row with maximum HR of 197bpm on heart monitor 06/2020  ? Osteoporosis   ? PAT  (paroxysmal atrial tachycardia) ([place])   ? nonsustained atrial tachycardia up to 23 seconds at a time on heart monitor 06/2020  ? Thyroid disease   ? Unspecified deficiency anemia 11/29/2013  ? ? ?ROS: ? ? All systems reviewed and negative except as noted in the HPI. ? ? ?Past Surgical History:  ?Procedure Laterality Date  ? APPENDECTOMY    ? EYE SURGERY Left 03/2013  ? eye bleeds internally  ? EYE SURGERY Right 03/2013  ? FRACTURE SURGERY Left 2012  ? leg with metal fixation  ? ? ? ?Family History  ?Problem Relation Age of Onset  ? Cancer Mother   ?     colon ca  ? Cancer Brother   ?     prostate ca  ? Cancer Brother   ?     lung ca  ? ? ? ?Social History  ? ?Socioeconomic History  ? Marital status: Married  ?  Spouse name: Not on file  ? Number of children: Not on file  ? Years of education: Not on file  ? Highest education level: Not on file  ?Occupational History  ? Not on file  ?Tobacco Use  ? Smoking status: Never  ? Smokeless tobacco: Never  ?Substance and Sexual Activity  ? Alcohol use: No  ? Drug use: No  ? Sexual activity: Not on file  ?Other Topics Concern  ? Not on file  ?Social History Narrative  ? Not on file  ? ?Social Determinants of Health  ? ?Financial Resource Strain: Not on file  ?Food Insecurity: Not on file  ?Transportation Needs: Not on file  ?Physical Activity: Not on file  ?Stress: Not on file  ?Social Connections: Not on file  ?Intimate Partner Violence: Not on file  ? ? ? ?BP (!) 146/52   Pulse 83   Ht '5\' 4"'$  (1.626 m)   Wt 189 lb 6.4 oz (85.9 kg)   SpO2 99%   BMI 32.51 kg/m?  ? ?Physical Exam: ? ?Well appearing NAD ?HEENT: Unremarkable ?Neck:  No JVD, no thyromegally ?Lymphatics:  No adenopathy ?Back:  No CVA tenderness ?Lungs:  Clear ?HEART:  Regular rate rhythm, no murmurs, no rubs, no clicks ?Abd:  soft, positive bowel sounds, no organomegally, no rebound, no guarding ?Ext:  2 plus pulses, no edema, no cyanosis, no clubbing ?Skin:  No rashes no nodules ?Neuro:  CN II through XII  intact, motor grossly intact ? ?EKG ?Nsr with bigeminal PVC's ? ?Assess/Plan:  ? ? ?Dizziness - this has resolved after stopping flecainide ?HTN - her bp is  controlled. She notes at other MD's office her bp is ok.  ?Obesity - her weight is unchanged.  ?PVC's/NSVT - she is still having a lot of ventricular ectopy but she feels well and is asymptomatic with no palpitations. We discussed the treatment options. She has felt bad taking mexitil and flecainide and I am reluctant to try another AA drug or recommend ablation due to her advanced age and lack of symptoms. She will undergo watchful waiting and I will see her back if she develops more symptoms. There are no good options for treatment and I would not recommend unless she develops symptoms. ?  ?  ?[name] Overlie Ladana Chavero,MD ?

## 2021-06-17 NOTE — Therapy (Signed)
?OUTPATIENT PHYSICAL THERAPY TREATMENT NOTE ? ? ?Patient Name: [name] Church ?MRN: [id number] ?DOB:Jul 11, 1934, 87 y.o., female ?Today's Date: 06/17/2021 ? ?PCP: Jonathon Jordan, MD ?REFERRING PROVIDER: Jonathon Jordan, MD ? ?END OF SESSION:  ? PT End of Session - 06/17/21 1239   ? ? Visit Number 5   ? Date for PT Re-Evaluation 07/24/21   ? Authorization Type U[employer] Medicare   ? PT Start Time 1232   ? PT Stop Time 1309   ? PT Time Calculation (min) 37 min   ? Activity Tolerance Patient tolerated treatment well   ? Behavior During Therapy [facility] for tasks assessed/performed   ? ?  ?  ? ?  ? ? ?Past Medical History:  ?Diagnosis Date  ? Diabetes mellitus without complication ([place])   ? Edema   ? Elevated serum immunoglobulin free light chain level 11/29/2013  ? Macrocytosis without anemia 11/29/2013  ? Nonsustained ventricular tachycardia ([place])   ? 115 episodes of NSVT on mointor up to 6 beats in a row with maximum HR of 197bpm on heart monitor 06/2020  ? Osteoporosis   ? PAT (paroxysmal atrial tachycardia) ([place])   ? nonsustained atrial tachycardia up to 23 seconds at a time on heart monitor 06/2020  ? Thyroid disease   ? Unspecified deficiency anemia 11/29/2013  ? ?Past Surgical History:  ?Procedure Laterality Date  ? APPENDECTOMY    ? EYE SURGERY Left 03/2013  ? eye bleeds internally  ? EYE SURGERY Right 03/2013  ? FRACTURE SURGERY Left 2012  ? leg with metal fixation  ? ?Patient Active Problem List  ? Diagnosis Date Noted  ? History of falling 05/29/2021  ? Diabetic peripheral neuropathy associated with type 1 diabetes mellitus ([place]) 03/20/2021  ? Irregular heart beat 08/21/2020  ? PVC's (premature ventricular contractions) 07/04/2020  ? Allergic rhinitis 02/21/2020  ? Body mass index (BMI) 34.0-34.9, adult 02/21/2020  ? Callosity 02/21/2020  ? Diabetic renal disease ([place]) 02/21/2020  ? Disorder of the skin and subcutaneous tissue, unspecified 02/21/2020  ? Essential hypertension 02/21/2020  ? Impairment of balance 02/21/2020  ?  Macrocytosis 02/21/2020  ? Onychomycosis 02/21/2020  ? Other intervertebral disc degeneration, lumbar region 02/21/2020  ? Pathological fracture 02/21/2020  ? Vitamin D deficiency 02/21/2020  ? Background diabetic retinopathy ([place]) 02/21/2020  ? Senile purpura ([place]) 02/21/2020  ? Stasis dermatitis 02/21/2020  ? CKD (chronic kidney disease) stage 3, GFR 30-59 ml/min (HCC) 10/08/2016  ? Osteoporosis, postmenopausal 04/08/2016  ? Primary hypothyroidism 04/08/2016  ? Type 1 diabetes mellitus with hyperglycemia ([place]) 04/08/2016  ? Type 1 diabetes mellitus with mild nonproliferative retinopathy of both eyes without macular edema (HCC) 04/08/2016  ? Elevated serum immunoglobulin free light chain level 11/29/2013  ? Macrocytosis without anemia 11/29/2013  ? Unspecified deficiency anemia 11/29/2013  ? Edema   ? ? ?REFERRING DIAG: R26.89 (ICD-10-CM) - Other abnormalities of gait and mobility  ? ?THERAPY DIAG:  ?History of falling ? ?Difficulty walking ? ?Unsteadiness on feet ? ?Muscle weakness (generalized) ? ?PERTINENT HISTORY: L femur Fx 11 years, then L fractured foot 10 years ago ? ?PRECAUTIONS: Fall risk ? ?SUBJECTIVE: No new issues and no pain.    ? ?PAIN:  ?Are you having pain? No ? ?PATIENT GOALS To be able to walk without any assistive device and to feel comfortable and legs not get weak.   ? ?OBJECTIVE:  ?  ?LE MMT: ?  ?MMT Right ?05/29/2021 Left ?05/29/2021  ?Hip flexion 4-/5 4-/5  ?Hip extension 3+/5 3+/5  ?  Hip abduction 3+/5 3+/5  ?Hip adduction 4/5 4/5  ?Hip internal rotation 3+/5 3+/5  ?Hip external rotation 3+/5 3+/5  ?Knee flexion 4/5 4/5  ?Knee extension 4/5 4/5  ?Ankle dorsiflexion 4/5 4/5  ?Ankle plantarflexion 4/5 4/5  ?Ankle inversion 4/5 4/5  ?Ankle eversion 4/5 4/5  ? (Blank rows = not tested) ?  ?  ?FUNCTIONAL TESTS:  ? ?05/29/2021: ?5 times sit to stand: 26.52 ?Timed up and go (TUG): 25.75 ?  ?GAIT: ?06/05/2021: ?3 min walk:  259 ft with RW ?Comments: short shuffle step, LE's externally rotated, heavy  reliance on RW ?  ?  ?  ?TODAY'S TREATMENT: ?06/17/2021: ?Nustep L5 x8 min, PT present to discuss status ?Lateral band walks at bar x 3 laps (verbal cues for correct alignment) ?Standing hip extension and hip abduction 2 x 10 each ?Step to over hurdles with single UE support on bar x 3 laps ?Side step to over hurdles with single UE support on bar x 3 laps ?Sit to stand without UE support x 10 ?Mini squats 2 x 10 ?Gait training x 100 feet with heavy verbal cues for step length and heel strike.  ?06/14/2021: ?Nustep L5 x8 min, PT present to discuss status ?Seated: over low hurdle leg lifts 10x each; red plyo ball chops 10x each way  ?Standing with UE support at counter:  heel raises, hip abduction to step over low object, UE reaches to circles on counter; LE reaches to circles on floor ?Side stepping and forward/back stepping to floor circles ?Gait practice to increase stride length: "take fewer steps on 2nd attempt" ?Sit to stand x 5 without UE support,  2nd set of 5 holding 2# weighted ball ? ?06/12/2021: ?Nustep L5 x6 min, PT present to discuss status ?Standing with UE support at bar:  marching, hip abduction, hip extension.  BLE x10 reps each LE ?Sit to stand x 5 with UE support,  2nd set of 5 with green loop for hip abduction x 5 ?Seated clam with green loop x 20 ?Standing band walks at bar with UE support 3 laps with green loop ?Seated with 3# ankle weight:  LAQ, marching, HS curl with red tband.  BLE 2x10 each ?Reviewed again, assistive device of RW vs 4WRW.  Discussed definite need of a new 2 wheel walker but to hold on 4 wheel walker.  PT will try to get a loaner and allow her to see how she feels with this and to see if she feels she could lift it in/out of her car.   ? ?06/05/2021: ?Nustep L1 x6 min, PT present to discuss status ?Seated with 1# ankle weight:  LAQ, marching, hip abduction scissors, clamshells with red tband, HS curl with red tband.  BLE 2x10 each ?Standing with UE support:  marching, hip flexion,  hip abduction, hip extension.  BLE x10 reps ?Ambulation in PT gym with RW for 3 min walk test. ?Reviewed assistive device of RW vs 4WRW.  Educated pt on benefits of each and advised her to go to test one at a medical supply store to see if she would be able to lift in/out of her car.  She verbalized her understanding. ? ?05/29/2021: Initial evaluation completed and initiated HEP ?  ?  ?PATIENT EDUCATION:  ?Education details: Gait training: educated on heel to toe progression and proper step length along with upright posture.  Also educated on appropriate assistive device and need to use regularly.  Suggested rollator walker due to the varying surfaces in  her home.  Educated on proper/safe sit to stand.   ?Person educated: Patient ?Education method: Explanation, Demonstration, and Verbal cues ?Education comprehension: verbalized understanding, returned demonstration, and verbal cues required ?  ?  ?HOME EXERCISE PROGRAM: ?Patient to work on proper step length with heel strike along with sit to stand x 5 every 2 hours.   ? ?Access Code: [email or web address] ?URL: [email or web address] ?Date: 06/05/2021 ?Prepared by: Juel Burrow ? ?Exercises ?- Seated Long Arc Quad  - 1 x daily - 7 x weekly - 2 sets - 10 reps ?- Seated March  - 1 x daily - 7 x weekly - 2 sets - 10 reps ?- Sit to Stand  - 1 x daily - 7 x weekly - 2 sets - 10 reps ?- Standing March with Counter Support  - 1 x daily - 7 x weekly - 2 sets - 10 reps ?- Standing Hip Abduction with Counter Support  - 1 x daily - 7 x weekly - 2 sets - 10 reps ?- Standing Hip Extension with Counter Support  - 1 x daily - 7 x weekly - 2 sets - 10 reps ?- Standing Hip Flexion with Counter Support  - 1 x daily - 7 x weekly - 2 sets - 10 reps ?  ?ASSESSMENT: ?  ?CLINICAL IMPRESSION: ?Patient needed several rest breaks today as we did mostly standing activities.  She needed heavy verbal cues upon entering clinic for increased step length.  She tends to default back to shortened  steps upon exiting the clinic.   She would benefit from continued skilled PT for LE strengthening, gait and balance training to reduce fall risk.    ?  ?OBJECTIVE IMPAIRMENTS Abnormal gait, decreased ac

## 2021-06-17 NOTE — Patient Instructions (Addendum)
Medication Instructions:  ?Your physician recommends that you continue on your current medications as directed. Please refer to the Current Medication list given to you today. ? ?Labwork: ?None ordered. ? ?Testing/Procedures: ?None ordered. ? ?Follow-Up: ?Your physician wants you to follow-up in: as needed with Cristopher Peru, MD  ? ? ? ?Any Other Special Instructions Will Be Listed Below (If Applicable). ? ?If you need a refill on your cardiac medications before your next appointment, please call your pharmacy.  ? ?Important Information About Sugar ? ? ? ? ? ? ? ?

## 2021-06-19 ENCOUNTER — Ambulatory Visit: Payer: Medicare Other

## 2021-06-19 DIAGNOSIS — Z9181 History of falling: Secondary | ICD-10-CM | POA: Diagnosis not present

## 2021-06-19 DIAGNOSIS — R2681 Unsteadiness on feet: Secondary | ICD-10-CM

## 2021-06-19 DIAGNOSIS — M6281 Muscle weakness (generalized): Secondary | ICD-10-CM | POA: Diagnosis not present

## 2021-06-19 DIAGNOSIS — R262 Difficulty in walking, not elsewhere classified: Secondary | ICD-10-CM | POA: Diagnosis not present

## 2021-06-19 NOTE — Therapy (Signed)
?OUTPATIENT PHYSICAL THERAPY TREATMENT NOTE ? ? ?Patient Name: [name] Church ?MRN: [id number] ?DOB:November 26, 1934, 87 y.o., female ?Today's Date: 06/19/2021 ? ?PCP: Jonathon Jordan, MD ?REFERRING PROVIDER: Jonathon Jordan, MD ? ?END OF SESSION:  ? PT End of Session - 06/19/21 1248   ? ? Visit Number 6   ? Authorization Type [employer]HC Medicare   ? PT Start Time 1230   ? PT Stop Time 1315   ? PT Time Calculation (min) 45 min   ? Activity Tolerance Patient tolerated treatment well   ? Behavior During Therapy [facility] for tasks assessed/performed   ? ?  ?  ? ?  ? ? ?Past Medical History:  ?Diagnosis Date  ? Diabetes mellitus without complication ([place])   ? Edema   ? Elevated serum immunoglobulin free light chain level 11/29/2013  ? Macrocytosis without anemia 11/29/2013  ? Nonsustained ventricular tachycardia ([place])   ? 115 episodes of NSVT on mointor up to 6 beats in a row with maximum HR of 197bpm on heart monitor 06/2020  ? Osteoporosis   ? PAT (paroxysmal atrial tachycardia) ([place])   ? nonsustained atrial tachycardia up to 23 seconds at a time on heart monitor 06/2020  ? Thyroid disease   ? Unspecified deficiency anemia 11/29/2013  ? ?Past Surgical History:  ?Procedure Laterality Date  ? APPENDECTOMY    ? EYE SURGERY Left 03/2013  ? eye bleeds internally  ? EYE SURGERY Right 03/2013  ? FRACTURE SURGERY Left 2012  ? leg with metal fixation  ? ?Patient Active Problem List  ? Diagnosis Date Noted  ? History of falling 05/29/2021  ? Diabetic peripheral neuropathy associated with type 1 diabetes mellitus ([place]) 03/20/2021  ? Irregular heart beat 08/21/2020  ? PVC's (premature ventricular contractions) 07/04/2020  ? Allergic rhinitis 02/21/2020  ? Body mass index (BMI) 34.0-34.9, adult 02/21/2020  ? Callosity 02/21/2020  ? Diabetic renal disease ([place]) 02/21/2020  ? Disorder of the skin and subcutaneous tissue, unspecified 02/21/2020  ? Essential hypertension 02/21/2020  ? Impairment of balance 02/21/2020  ? Macrocytosis 02/21/2020  ?  Onychomycosis 02/21/2020  ? Other intervertebral disc degeneration, lumbar region 02/21/2020  ? Pathological fracture 02/21/2020  ? Vitamin D deficiency 02/21/2020  ? Background diabetic retinopathy ([place]) 02/21/2020  ? Senile purpura ([place]) 02/21/2020  ? Stasis dermatitis 02/21/2020  ? CKD (chronic kidney disease) stage 3, GFR 30-59 ml/min (HCC) 10/08/2016  ? Osteoporosis, postmenopausal 04/08/2016  ? Primary hypothyroidism 04/08/2016  ? Type 1 diabetes mellitus with hyperglycemia ([place]) 04/08/2016  ? Type 1 diabetes mellitus with mild nonproliferative retinopathy of both eyes without macular edema (HCC) 04/08/2016  ? Elevated serum immunoglobulin free light chain level 11/29/2013  ? Macrocytosis without anemia 11/29/2013  ? Unspecified deficiency anemia 11/29/2013  ? Edema   ? ? ?REFERRING DIAG: R26.89 (ICD-10-CM) - Other abnormalities of gait and mobility  ? ?THERAPY DIAG:  ?History of falling ? ?Difficulty walking ? ?Unsteadiness on feet ? ?Muscle weakness (generalized) ? ?PERTINENT HISTORY: L femur Fx 11 years, then L fractured foot 10 years ago ? ?PRECAUTIONS: Fall risk ? ?SUBJECTIVE: Patient states she is trying to find a medical supply store close by so that she can see how heavy the equipment is that we are suggesting and to find a new walker that she can fold in and out easier.   No complaints.  "I've been working on my standing up and sitting down" ? ?PAIN:  ?Are you having pain? No ? ?PATIENT GOALS To be able to walk without  any assistive device and to feel comfortable and legs not get weak.   ? ?OBJECTIVE:  ?  ?LE MMT: ?  ?MMT Right ?05/29/2021 Left ?05/29/2021  ?Hip flexion 4-/5 4-/5  ?Hip extension 3+/5 3+/5  ?Hip abduction 3+/5 3+/5  ?Hip adduction 4/5 4/5  ?Hip internal rotation 3+/5 3+/5  ?Hip external rotation 3+/5 3+/5  ?Knee flexion 4/5 4/5  ?Knee extension 4/5 4/5  ?Ankle dorsiflexion 4/5 4/5  ?Ankle plantarflexion 4/5 4/5  ?Ankle inversion 4/5 4/5  ?Ankle eversion 4/5 4/5  ? (Blank rows = not  tested) ?  ?  ?FUNCTIONAL TESTS:  ? ?05/29/2021: ?5 times sit to stand: 26.52 ?Timed up and go (TUG): 25.75 ?  ?GAIT: ?06/05/2021: ?3 min walk:  259 ft with RW ?Comments: short shuffle step, LE's externally rotated, heavy reliance on RW ?  ?  ?  ?TODAY'S TREATMENT: ?06/19/2021: ?Nustep L5 x8 min, PT present to discuss status ?Standing hip extension and hip abduction 2 x 10 each ?Seated clam x 20 with green loop ?Sit to stand without UE support 2  x 10 green loop around knees for encouraging hip abduction and alignment ?LAQ 2 x 10 with 5 lb ?Gait training x 100 feet with heavy verbal cues for step length and heel strike. (Added tennis balls to back legs) ? ?06/17/2021: ?Nustep L5 x8 min, PT present to discuss status ?Lateral band walks at bar x 3 laps (verbal cues for correct alignment) ?Standing hip extension and hip abduction 2 x 10 each ?Step to over hurdles with single UE support on bar x 3 laps ?Side step to over hurdles with single UE support on bar x 3 laps ?Sit to stand without UE support x 10 ?Mini squats 2 x 10 ?Gait training x 100 feet with heavy verbal cues for step length and heel strike.  ?06/14/2021: ?Nustep L5 x8 min, PT present to discuss status ?Seated: over low hurdle leg lifts 10x each; red plyo ball chops 10x each way  ?Standing with UE support at counter:  heel raises, hip abduction to step over low object, UE reaches to circles on counter; LE reaches to circles on floor ?Side stepping and forward/back stepping to floor circles ?Gait practice to increase stride length: "take fewer steps on 2nd attempt" ?Sit to stand x 5 without UE support,  2nd set of 5 holding 2# weighted ball ? ?06/12/2021: ?Nustep L5 x6 min, PT present to discuss status ?Standing with UE support at bar:  marching, hip abduction, hip extension.  BLE x10 reps each LE ?Sit to stand x 5 with UE support,  2nd set of 5 with green loop for hip abduction x 5 ?Seated clam with green loop x 20 ?Standing band walks at bar with UE support 3  laps with green loop ?Seated with 3# ankle weight:  LAQ, marching, HS curl with red tband.  BLE 2x10 each ?Reviewed again, assistive device of RW vs 4WRW.  Discussed definite need of a new 2 wheel walker but to hold on 4 wheel walker.  PT will try to get a loaner and allow her to see how she feels with this and to see if she feels she could lift it in/out of her car.   ? ?06/05/2021: ?Nustep L1 x6 min, PT present to discuss status ?Seated with 1# ankle weight:  LAQ, marching, hip abduction scissors, clamshells with red tband, HS curl with red tband.  BLE 2x10 each ?Standing with UE support:  marching, hip flexion, hip abduction, hip extension.  BLE x10 reps ?Ambulation in PT gym with RW for 3 min walk test. ?Reviewed assistive device of RW vs 4WRW.  Educated pt on benefits of each and advised her to go to test one at a medical supply store to see if she would be able to lift in/out of her car.  She verbalized her understanding. ? ?05/29/2021: Initial evaluation completed and initiated HEP ?  ?  ?PATIENT EDUCATION:  ?Education details: Gait training: educated on heel to toe progression and proper step length along with upright posture.  Also educated on appropriate assistive device and need to use regularly.  Suggested rollator walker due to the varying surfaces in her home.  Educated on proper/safe sit to stand.   ?Person educated: Patient ?Education method: Explanation, Demonstration, and Verbal cues ?Education comprehension: verbalized understanding, returned demonstration, and verbal cues required ?  ?  ?HOME EXERCISE PROGRAM: ?Patient to work on proper step length with heel strike along with sit to stand x 5 every 2 hours.   ? ?Access Code: [email or web address] ?URL: [email or web address] ?Date: 06/05/2021 ?Prepared by: Juel Burrow ? ?Exercises ?- Seated Long Arc Quad  - 1 x daily - 7 x weekly - 2 sets - 10 reps ?- Seated March  - 1 x daily - 7 x weekly - 2 sets - 10 reps ?- Sit to Stand  - 1 x daily - 7 x  weekly - 2 sets - 10 reps ?- Standing March with Counter Support  - 1 x daily - 7 x weekly - 2 sets - 10 reps ?- Standing Hip Abduction with Counter Support  - 1 x daily - 7 x weekly - 2 sets - 10 reps ?- Standing Hip Exten

## 2021-06-25 ENCOUNTER — Ambulatory Visit: Payer: Medicare Other

## 2021-06-25 DIAGNOSIS — R2681 Unsteadiness on feet: Secondary | ICD-10-CM | POA: Diagnosis not present

## 2021-06-25 DIAGNOSIS — Z9181 History of falling: Secondary | ICD-10-CM

## 2021-06-25 DIAGNOSIS — M6281 Muscle weakness (generalized): Secondary | ICD-10-CM | POA: Diagnosis not present

## 2021-06-25 DIAGNOSIS — R262 Difficulty in walking, not elsewhere classified: Secondary | ICD-10-CM

## 2021-06-25 NOTE — Therapy (Signed)
?OUTPATIENT PHYSICAL THERAPY TREATMENT NOTE ? ? ?Patient Name: [name] Church ?MRN: [id number] ?DOB:May 09, 1934, 87 y.o., female ?Today's Date: 06/25/2021 ? ?PCP: Jonathon Jordan, MD ?REFERRING PROVIDER: Jonathon Jordan, MD ? ?END OF SESSION:  ? PT End of Session - 06/25/21 1448   ? ? Visit Number 7   ? Date for PT Re-Evaluation 07/24/21   ? Authorization Type U[employer] Medicare   ? PT Start Time 0347   ? PT Stop Time 1530   ? PT Time Calculation (min) 41 min   ? Activity Tolerance Patient tolerated treatment well   ? Behavior During Therapy [facility] for tasks assessed/performed   ? ?  ?  ? ?  ? ? ?Past Medical History:  ?Diagnosis Date  ? Diabetes mellitus without complication ([place])   ? Edema   ? Elevated serum immunoglobulin free light chain level 11/29/2013  ? Macrocytosis without anemia 11/29/2013  ? Nonsustained ventricular tachycardia ([place])   ? 115 episodes of NSVT on mointor up to 6 beats in a row with maximum HR of 197bpm on heart monitor 06/2020  ? Osteoporosis   ? PAT (paroxysmal atrial tachycardia) ([place])   ? nonsustained atrial tachycardia up to 23 seconds at a time on heart monitor 06/2020  ? Thyroid disease   ? Unspecified deficiency anemia 11/29/2013  ? ?Past Surgical History:  ?Procedure Laterality Date  ? APPENDECTOMY    ? EYE SURGERY Left 03/2013  ? eye bleeds internally  ? EYE SURGERY Right 03/2013  ? FRACTURE SURGERY Left 2012  ? leg with metal fixation  ? ?Patient Active Problem List  ? Diagnosis Date Noted  ? History of falling 05/29/2021  ? Diabetic peripheral neuropathy associated with type 1 diabetes mellitus ([place]) 03/20/2021  ? Irregular heart beat 08/21/2020  ? PVC's (premature ventricular contractions) 07/04/2020  ? Allergic rhinitis 02/21/2020  ? Body mass index (BMI) 34.0-34.9, adult 02/21/2020  ? Callosity 02/21/2020  ? Diabetic renal disease ([place]) 02/21/2020  ? Disorder of the skin and subcutaneous tissue, unspecified 02/21/2020  ? Essential hypertension 02/21/2020  ? Impairment of balance 02/21/2020  ?  Macrocytosis 02/21/2020  ? Onychomycosis 02/21/2020  ? Other intervertebral disc degeneration, lumbar region 02/21/2020  ? Pathological fracture 02/21/2020  ? Vitamin D deficiency 02/21/2020  ? Background diabetic retinopathy ([place]) 02/21/2020  ? Senile purpura ([place]) 02/21/2020  ? Stasis dermatitis 02/21/2020  ? CKD (chronic kidney disease) stage 3, GFR 30-59 ml/min (HCC) 10/08/2016  ? Osteoporosis, postmenopausal 04/08/2016  ? Primary hypothyroidism 04/08/2016  ? Type 1 diabetes mellitus with hyperglycemia ([place]) 04/08/2016  ? Type 1 diabetes mellitus with mild nonproliferative retinopathy of both eyes without macular edema (HCC) 04/08/2016  ? Elevated serum immunoglobulin free light chain level 11/29/2013  ? Macrocytosis without anemia 11/29/2013  ? Unspecified deficiency anemia 11/29/2013  ? Edema   ? ? ?REFERRING DIAG: R26.89 (ICD-10-CM) - Other abnormalities of gait and mobility  ? ?THERAPY DIAG:  ?History of falling ? ?Difficulty walking ? ?Unsteadiness on feet ? ?Muscle weakness (generalized) ? ?PERTINENT HISTORY: L femur Fx 11 years, then L fractured foot 10 years ago ? ?PRECAUTIONS: Fall risk ? ?SUBJECTIVE: Patient states she is doing well.  Arrives with cane vs. [place] Rile.   ? ?PAIN:  ?Are you having pain? No ? ?PATIENT GOALS To be able to walk without any assistive device and to feel comfortable and legs not get weak.   ? ?OBJECTIVE:  ?  ?LE MMT: ?  ?MMT Right ?05/29/2021 Left ?05/29/2021  ?Hip flexion 4-/5 4-/5  ?  Hip extension 3+/5 3+/5  ?Hip abduction 3+/5 3+/5  ?Hip adduction 4/5 4/5  ?Hip internal rotation 3+/5 3+/5  ?Hip external rotation 3+/5 3+/5  ?Knee flexion 4/5 4/5  ?Knee extension 4/5 4/5  ?Ankle dorsiflexion 4/5 4/5  ?Ankle plantarflexion 4/5 4/5  ?Ankle inversion 4/5 4/5  ?Ankle eversion 4/5 4/5  ? (Blank rows = not tested) ?  ?  ?FUNCTIONAL TESTS:  ? ?05/29/2021: ?5 times sit to stand: 26.52 ?Timed up and go (TUG): 25.75 ?  ?GAIT: ?06/05/2021: ?3 min walk:  259 ft with RW ?Comments: short shuffle  step, LE's externally rotated, heavy reliance on RW ?  ?  ?  ?TODAY'S TREATMENT: ?06/25/2021: ?Nustep L5 x8 min, PT present to discuss status and goals ?Side and fwd lunge with right LE to work on increased step length 2 x 10 ?Step over hurdle with right foot with left foot beside hurdle (4 hurdles walking fwd) x 3 (at barre with cane in opposite  hand) ?Step through gait over hurdles (cane and barre) ?Instruction in proper sit to stand x 5 ?Tandem stance right foot fwd then left with arm swing with min assist x 20 swings ?Lateral band walks with green loop x 4 laps at barre ?Gait training x 50 feet with heavy verbal cues for step length and heel strike with single point cane, exiting building and down ramp ?06/19/2021: ?Nustep L5 x8 min, PT present to discuss status ?Standing hip extension and hip abduction 2 x 10 each ?Seated clam x 20 with green loop ?Sit to stand without UE support 2  x 10 green loop around knees for encouraging hip abduction and alignment ?LAQ 2 x 10 with 5 lb ?Gait training x 100 feet with heavy verbal cues for step length and heel strike. (Added tennis balls to back legs) ? ?06/17/2021: ?Nustep L5 x8 min, PT present to discuss status ?Lateral band walks at bar x 3 laps (verbal cues for correct alignment) ?Standing hip extension and hip abduction 2 x 10 each ?Step to over hurdles with single UE support on bar x 3 laps ?Side step to over hurdles with single UE support on bar x 3 laps ?Sit to stand without UE support x 10 ?Mini squats 2 x 10 ?Gait training x 100 feet with heavy verbal cues for step length and heel strike.  ?  ?  ?PATIENT EDUCATION:  ?Education details: Gait training: educated on heel to toe progression and proper step length along with upright posture.  Also reminded patient about appropriate assistive device and need to use regularly.   Educated on proper/safe sit to stand.   ?Person educated: Patient ?Education method: Explanation, Demonstration, and Verbal cues ?Education  comprehension: verbalized understanding, returned demonstration, and verbal cues required ?  ?  ?HOME EXERCISE PROGRAM: ?Patient to work on proper step length with heel strike along with sit to stand x 5 every 2 hours.   ? ?Access Code: [email or web address] ?URL: [email or web address] ?Date: 06/05/2021 ?Prepared by: Juel Burrow ? ?Exercises ?- Seated Long Arc Quad  - 1 x daily - 7 x weekly - 2 sets - 10 reps ?- Seated March  - 1 x daily - 7 x weekly - 2 sets - 10 reps ?- Sit to Stand  - 1 x daily - 7 x weekly - 2 sets - 10 reps ?- Standing March with Counter Support  - 1 x daily - 7 x weekly - 2 sets - 10 reps ?- Standing Hip Abduction with Counter Support  -  1 x daily - 7 x weekly - 2 sets - 10 reps ?- Standing Hip Extension with Counter Support  - 1 x daily - 7 x weekly - 2 sets - 10 reps ?- Standing Hip Flexion with Counter Support  - 1 x daily - 7 x weekly - 2 sets - 10 reps ?  ?ASSESSMENT: ?  ?CLINICAL IMPRESSION: ?Patient continues to demonstrate step to gait with right foot only.  She walked in with cane today because she did not want to struggle with getting her walking out of the car.  However, she becomes extremely fearful and unsteady on the ramp and is at high risk for falling.  Instructed patient to use walker any time she is in an environment where she feels unsteady.    She would benefit from continued skilled PT for LE strengthening, gait and balance training to reduce fall risk.    ?  ?OBJECTIVE IMPAIRMENTS Abnormal gait, decreased activity tolerance, decreased balance, decreased endurance, decreased knowledge of condition, decreased knowledge of use of DME, decreased mobility, difficulty walking, decreased strength, decreased safety awareness, impaired flexibility, and postural dysfunction.  ?  ?ACTIVITY LIMITATIONS cleaning, community activity, meal prep, laundry, yard work, and shopping.  ?  ?PERSONAL FACTORS Age, Fitness, Past/current experiences, Time since onset of  injury/illness/exacerbation, and 3+ comorbidities: htn, diabetes and obesity  are also affecting patient's functional outcome.  ?  ?  ?REHAB POTENTIAL: Fair due to multiple co-morbidities. ?  ?CLINICAL DECISION MAKING: Evolving/mode

## 2021-07-02 ENCOUNTER — Ambulatory Visit: Payer: Medicare Other | Attending: Family Medicine

## 2021-07-02 DIAGNOSIS — R262 Difficulty in walking, not elsewhere classified: Secondary | ICD-10-CM | POA: Diagnosis not present

## 2021-07-02 DIAGNOSIS — M6281 Muscle weakness (generalized): Secondary | ICD-10-CM | POA: Insufficient documentation

## 2021-07-02 DIAGNOSIS — R2681 Unsteadiness on feet: Secondary | ICD-10-CM | POA: Insufficient documentation

## 2021-07-02 DIAGNOSIS — Z9181 History of falling: Secondary | ICD-10-CM | POA: Insufficient documentation

## 2021-07-02 NOTE — Therapy (Signed)
?OUTPATIENT PHYSICAL THERAPY TREATMENT NOTE ? ? ?Patient Name: [name] Church ?MRN: [id number] ?DOB:[birth year], 87 y.o., female ?Today's Date: 07/02/2021 ? ?PCP: Jonathon Jordan, MD ?REFERRING PROVIDER: Jonathon Jordan, MD ? ?END OF SESSION:  ? PT End of Session - 07/02/21 1201   ? ? Visit Number 8   ? Date for PT Re-Evaluation 07/24/21   ? Authorization Type [employer] Medicare   ? PT Start Time 1145   ? PT Stop Time 1230   ? PT Time Calculation (min) 45 min   ? Activity Tolerance Patient tolerated treatment well   ? Behavior During Therapy [facility] for tasks assessed/performed   ? ?  ?  ? ?  ? ? ?Past Medical History:  ?Diagnosis Date  ? Diabetes mellitus without complication ([place])   ? Edema   ? Elevated serum immunoglobulin free light chain level 11/29/2013  ? Macrocytosis without anemia 11/29/2013  ? Nonsustained ventricular tachycardia ([place])   ? 115 episodes of NSVT on mointor up to 6 beats in a row with maximum HR of 197bpm on heart monitor 06/2020  ? Osteoporosis   ? PAT (paroxysmal atrial tachycardia) ([place])   ? nonsustained atrial tachycardia up to 23 seconds at a time on heart monitor 06/2020  ? Thyroid disease   ? Unspecified deficiency anemia 11/29/2013  ? ?Past Surgical History:  ?Procedure Laterality Date  ? APPENDECTOMY    ? EYE SURGERY Left 03/2013  ? eye bleeds internally  ? EYE SURGERY Right 03/2013  ? FRACTURE SURGERY Left 2012  ? leg with metal fixation  ? ?Patient Active Problem List  ? Diagnosis Date Noted  ? History of falling 05/29/2021  ? Diabetic peripheral neuropathy associated with type 1 diabetes mellitus ([place]) 03/20/2021  ? Irregular heart beat 08/21/2020  ? PVC's (premature ventricular contractions) 07/04/2020  ? Allergic rhinitis 02/21/2020  ? Body mass index (BMI) 34.0-34.9, adult 02/21/2020  ? Callosity 02/21/2020  ? Diabetic renal disease ([place]) 02/21/2020  ? Disorder of the skin and subcutaneous tissue, unspecified 02/21/2020  ? Essential hypertension 02/21/2020  ? Impairment of balance 02/21/2020  ?  Macrocytosis 02/21/2020  ? Onychomycosis 02/21/2020  ? Other intervertebral disc degeneration, lumbar region 02/21/2020  ? Pathological fracture 02/21/2020  ? Vitamin D deficiency 02/21/2020  ? Background diabetic retinopathy ([place]) 02/21/2020  ? Senile purpura ([place]) 02/21/2020  ? Stasis dermatitis 02/21/2020  ? CKD (chronic kidney disease) stage 3, GFR 30-59 ml/min (HCC) 10/08/2016  ? Osteoporosis, postmenopausal 04/08/2016  ? Primary hypothyroidism 04/08/2016  ? Type 1 diabetes mellitus with hyperglycemia ([place]) 04/08/2016  ? Type 1 diabetes mellitus with mild nonproliferative retinopathy of both eyes without macular edema (HCC) 04/08/2016  ? Elevated serum immunoglobulin free light chain level 11/29/2013  ? Macrocytosis without anemia 11/29/2013  ? Unspecified deficiency anemia 11/29/2013  ? Edema   ? ? ?REFERRING DIAG: R26.89 (ICD-10-CM) - Other abnormalities of gait and mobility  ? ?THERAPY DIAG:  ?History of falling ? ?Difficulty walking ? ?Unsteadiness on feet ? ?Muscle weakness (generalized) ? ?PERTINENT HISTORY: L femur Fx 11 years, then L fractured foot 10 years ago ? ?PRECAUTIONS: Fall risk ? ?SUBJECTIVE: Patient states she is "just tired".  Arrives with walker today.     ? ?PAIN:  ?Are you having pain? No ? ?PATIENT GOALS To be able to walk without any assistive device and to feel comfortable and legs not get weak.   ? ?OBJECTIVE:  ?  ?LE MMT: ?  ?MMT Right ?05/29/2021 Left ?05/29/2021  ?Hip flexion 4-/5 4-/5  ?  Hip extension 3+/5 3+/5  ?Hip abduction 3+/5 3+/5  ?Hip adduction 4/5 4/5  ?Hip internal rotation 3+/5 3+/5  ?Hip external rotation 3+/5 3+/5  ?Knee flexion 4/5 4/5  ?Knee extension 4/5 4/5  ?Ankle dorsiflexion 4/5 4/5  ?Ankle plantarflexion 4/5 4/5  ?Ankle inversion 4/5 4/5  ?Ankle eversion 4/5 4/5  ? (Blank rows = not tested) ?  ?  ?FUNCTIONAL TESTS:  ? ?05/29/2021: ?5 times sit to stand: 26.52 ?Timed up and go (TUG): 25.75 ?  ?GAIT: ?06/05/2021: ?3 min walk:  259 ft with RW ?Comments: short shuffle  step, LE's externally rotated, heavy reliance on RW ?  ?  ?  ?TODAY'S TREATMENT: ?07/02/2021: ?Nustep L5 x8 min, PT present to discuss status and goals ?Seated toe and heel raises x 20 each ?Seated LAQ with 5 lb 2 x 10 ?Seated march x 20 with 5 lb x 20 ?Seated abdominal crunch with 5 lb dumbell ?Sit to stand from table with balance pad 2 x 10 with 5 lb dumbell ?Seated bicep curls 2 x 10 with 2 lb dumbells both ?Seated tricep press 2 x 10 with yellow tband ?Seated horizontal abduction with yellow tband x 20 ?Gait training with rolling walker: heavy verbal cues for step through with right foot, upright posture, heel strike and increased step length ?3 min walk test: 277 feet ?06/25/2021: ?Nustep L5 x8 min, PT present to discuss status and goals ?Seated toe and heel raises x 20 each ?Seated LAQ with 5 lb 2 x 10 ?Seated march x 20 with 5 lb x 20 ?Side and fwd lunge with right LE to work on increased step length 2 x 10 ?Step over hurdle with right foot with left foot beside hurdle (4 hurdles walking fwd) x 3 (at barre with cane in opposite  hand) ?Step through gait over hurdles (cane and barre) ?Instruction in proper sit to stand x 5 ?Tandem stance right foot fwd then left with arm swing with min assist x 20 swings ?Lateral band walks with green loop x 4 laps at barre ?Gait training x 50 feet with heavy verbal cues for step length and heel strike with single point cane, exiting building and down ramp ?06/19/2021: ?Nustep L5 x8 min, PT present to discuss status ?Standing hip extension and hip abduction 2 x 10 each ?Seated clam x 20 with green loop ?Sit to stand without UE support 2  x 10 green loop around knees for encouraging hip abduction and alignment ?LAQ 2 x 10 with 5 lb ?Gait training x 100 feet with heavy verbal cues for step length and heel strike. (Added tennis balls to back legs) ? ?06/17/2021: ?Nustep L5 x8 min, PT present to discuss status ?Lateral band walks at bar x 3 laps (verbal cues for correct  alignment) ?Standing hip extension and hip abduction 2 x 10 each ?Step to over hurdles with single UE support on bar x 3 laps ?Side step to over hurdles with single UE support on bar x 3 laps ?Sit to stand without UE support x 10 ?Mini squats 2 x 10 ?Gait training x 100 feet with heavy verbal cues for step length and heel strike.  ?  ?  ?PATIENT EDUCATION:  ?Education details: Gait training: educated on heel to toe progression and proper step length along with upright posture.  Also reminded patient about appropriate assistive device and need to use regularly.   Educated on proper/safe sit to stand.   ?Person educated: Patient ?Education method: Explanation, Demonstration, and Verbal cues ?  Education comprehension: verbalized understanding, returned demonstration, and verbal cues required ?  ?  ?HOME EXERCISE PROGRAM: ?Patient to work on proper step length with heel strike along with sit to stand x 5 every 2 hours.   ? ?Access Code: [email or web address] ?URL: [email or web address] ?Date: 06/05/2021 ?Prepared by: Juel Burrow ? ?Exercises ?- Seated Long Arc Quad  - 1 x daily - 7 x weekly - 2 sets - 10 reps ?- Seated March  - 1 x daily - 7 x weekly - 2 sets - 10 reps ?- Sit to Stand  - 1 x daily - 7 x weekly - 2 sets - 10 reps ?- Standing March with Counter Support  - 1 x daily - 7 x weekly - 2 sets - 10 reps ?- Standing Hip Abduction with Counter Support  - 1 x daily - 7 x weekly - 2 sets - 10 reps ?- Standing Hip Extension with Counter Support  - 1 x daily - 7 x weekly - 2 sets - 10 reps ?- Standing Hip Flexion with Counter Support  - 1 x daily - 7 x weekly - 2 sets - 10 reps ?  ?ASSESSMENT: ?  ?CLINICAL IMPRESSION: ?Patient continues to demonstrate step to gait with right foot only unless given heavy verbal cues.  Educated patient on need to be consistent with increased step length to develop this motor pattern.  She would benefit from continued skilled PT for LE strengthening, gait and balance training to reduce  fall risk.    ?  ?OBJECTIVE IMPAIRMENTS Abnormal gait, decreased activity tolerance, decreased balance, decreased endurance, decreased knowledge of condition, decreased knowledge of use of DME, decreased mobility, di

## 2021-07-04 ENCOUNTER — Ambulatory Visit: Payer: Medicare Other

## 2021-07-04 DIAGNOSIS — Z9181 History of falling: Secondary | ICD-10-CM | POA: Diagnosis not present

## 2021-07-04 DIAGNOSIS — R2681 Unsteadiness on feet: Secondary | ICD-10-CM | POA: Diagnosis not present

## 2021-07-04 DIAGNOSIS — M6281 Muscle weakness (generalized): Secondary | ICD-10-CM | POA: Diagnosis not present

## 2021-07-04 DIAGNOSIS — R262 Difficulty in walking, not elsewhere classified: Secondary | ICD-10-CM

## 2021-07-04 NOTE — Therapy (Signed)
?OUTPATIENT PHYSICAL THERAPY TREATMENT NOTE ? ? ?Patient Name: [name] Church ?MRN: [id number] ?DOB:1934/09/15, 87 y.o., female ?Today's Date: 07/04/2021 ? ?PCP: Jonathon Jordan, MD ?REFERRING PROVIDER: Jonathon Jordan, MD ? ?END OF SESSION:  ? PT End of Session - 07/04/21 1148   ? ? Visit Number 9   ? Date for PT Re-Evaluation 07/24/21   ? Authorization Type [employer] Medicare   ? PT Start Time 1145   ? PT Stop Time 1230   ? PT Time Calculation (min) 45 min   ? Activity Tolerance Patient tolerated treatment well   ? Behavior During Therapy [facility] for tasks assessed/performed   ? ?  ?  ? ?  ? ? ?Past Medical History:  ?Diagnosis Date  ? Diabetes mellitus without complication ([place])   ? Edema   ? Elevated serum immunoglobulin free light chain level 11/29/2013  ? Macrocytosis without anemia 11/29/2013  ? Nonsustained ventricular tachycardia ([place])   ? 115 episodes of NSVT on mointor up to 6 beats in a row with maximum HR of 197bpm on heart monitor 06/2020  ? Osteoporosis   ? PAT (paroxysmal atrial tachycardia) ([place])   ? nonsustained atrial tachycardia up to 23 seconds at a time on heart monitor 06/2020  ? Thyroid disease   ? Unspecified deficiency anemia 11/29/2013  ? ?Past Surgical History:  ?Procedure Laterality Date  ? APPENDECTOMY    ? EYE SURGERY Left 03/2013  ? eye bleeds internally  ? EYE SURGERY Right 03/2013  ? FRACTURE SURGERY Left 2012  ? leg with metal fixation  ? ?Patient Active Problem List  ? Diagnosis Date Noted  ? History of falling 05/29/2021  ? Diabetic peripheral neuropathy associated with type 1 diabetes mellitus ([place]) 03/20/2021  ? Irregular heart beat 08/21/2020  ? PVC's (premature ventricular contractions) 07/04/2020  ? Allergic rhinitis 02/21/2020  ? Body mass index (BMI) 34.0-34.9, adult 02/21/2020  ? Callosity 02/21/2020  ? Diabetic renal disease ([place]) 02/21/2020  ? Disorder of the skin and subcutaneous tissue, unspecified 02/21/2020  ? Essential hypertension 02/21/2020  ? Impairment of balance 02/21/2020  ?  Macrocytosis 02/21/2020  ? Onychomycosis 02/21/2020  ? Other intervertebral disc degeneration, lumbar region 02/21/2020  ? Pathological fracture 02/21/2020  ? Vitamin D deficiency 02/21/2020  ? Background diabetic retinopathy ([place]) 02/21/2020  ? Senile purpura ([place]) 02/21/2020  ? Stasis dermatitis 02/21/2020  ? CKD (chronic kidney disease) stage 3, GFR 30-59 ml/min (HCC) 10/08/2016  ? Osteoporosis, postmenopausal 04/08/2016  ? Primary hypothyroidism 04/08/2016  ? Type 1 diabetes mellitus with hyperglycemia ([place]) 04/08/2016  ? Type 1 diabetes mellitus with mild nonproliferative retinopathy of both eyes without macular edema (HCC) 04/08/2016  ? Elevated serum immunoglobulin free light chain level 11/29/2013  ? Macrocytosis without anemia 11/29/2013  ? Unspecified deficiency anemia 11/29/2013  ? Edema   ? ? ?REFERRING DIAG: R26.89 (ICD-10-CM) - Other abnormalities of gait and mobility  ? ?THERAPY DIAG:  ?History of falling ? ?Difficulty walking ? ?Unsteadiness on feet ? ?Muscle weakness (generalized) ? ?PERTINENT HISTORY: L femur Fx 11 years, then L fractured foot 10 years ago ? ?PRECAUTIONS: Fall risk ? ?SUBJECTIVE: Patient states she is "just tired".  Arrives with walker today.     ? ?PAIN:  ?Are you having pain? No ? ?PATIENT GOALS To be able to walk without any assistive device and to feel comfortable and legs not get weak.   ? ?OBJECTIVE:  ?  ?LE MMT: ?  ?MMT Right ?05/29/2021 Left ?05/29/2021  ?Hip flexion 4-/5 4-/5  ?  Hip extension 3+/5 3+/5  ?Hip abduction 3+/5 3+/5  ?Hip adduction 4/5 4/5  ?Hip internal rotation 3+/5 3+/5  ?Hip external rotation 3+/5 3+/5  ?Knee flexion 4/5 4/5  ?Knee extension 4/5 4/5  ?Ankle dorsiflexion 4/5 4/5  ?Ankle plantarflexion 4/5 4/5  ?Ankle inversion 4/5 4/5  ?Ankle eversion 4/5 4/5  ? (Blank rows = not tested) ?  ?  ?FUNCTIONAL TESTS:  ? ?05/29/2021: ?5 times sit to stand: 26.52 ?Timed up and go (TUG): 25.75 ?  ?GAIT: ?06/05/2021: ?3 min walk:  259 ft with RW ?Comments: short shuffle  step, LE's externally rotated, heavy reliance on RW ?  ?  ?  ?TODAY'S TREATMENT: ?07/04/2021: ?Nustep L5 x8 min, PT present to discuss status and goals ?Seated hip ER (knees apart, bottom of foot to opposite leg) x 20 both ?Seated toe and heel raises x 20 each ?Seated LAQ with 5 lb 2 x 10 ?Seated march x 20 with 5 lb x 20 ?Seated abdominal crunch with 5 lb dumbell ?Sit to stand from chair without UE support 2 x 10 with 5 lb kb one set and without 5 lb kb ?Seated bicep curls 2 x 10 with 2 lb dumbells both ?Seated tricep press 2 x 10 with yellow tband ?Seated horizontal abduction with yellow tband x 20 ?Gait training with rolling walker: heavy verbal cues for step through with right foot, upright posture, heel strike and increased step length ?3 min walk test: 312 feet ?07/02/2021: ?Nustep L5 x8 min, PT present to discuss status and goals ?Seated toe and heel raises x 20 each ?Seated LAQ with 5 lb 2 x 10 ?Seated march x 20 with 5 lb x 20 ?Seated abdominal crunch with 5 lb dumbell ?Sit to stand from table with balance pad 2 x 10 with 5 lb dumbell ?Seated bicep curls 2 x 10 with 2 lb dumbells both ?Seated tricep press 2 x 10 with yellow tband ?Gait training with rolling walker: heavy verbal cues for step through with right foot, upright posture, heel strike and increased step length ?3 min walk test: 277 feet ?06/25/2021: ?Nustep L5 x8 min, PT present to discuss status and goals ?Seated toe and heel raises x 20 each ?Seated LAQ with 5 lb 2 x 10 ?Seated march x 20 with 5 lb x 20 ?Side and fwd lunge with right LE to work on increased step length 2 x 10 ?Step over hurdle with right foot with left foot beside hurdle (4 hurdles walking fwd) x 3 (at barre with cane in opposite  hand) ?Step through gait over hurdles (cane and barre) ?Instruction in proper sit to stand x 5 ?Tandem stance right foot fwd then left with arm swing with min assist x 20 swings ?Lateral band walks with green loop x 4 laps at barre ?Gait training x 50 feet  with heavy verbal cues for step length and heel strike with single point cane, exiting building and down ramp ? ?  ?  ?PATIENT EDUCATION:  ?Education details: Gait training: educated on heel to toe progression and proper step length along with upright posture.  Also reminded patient about appropriate assistive device and need to use regularly.   Educated on proper/safe sit to stand.   ?Person educated: Patient ?Education method: Explanation, Demonstration, and Verbal cues ?Education comprehension: verbalized understanding, returned demonstration, and verbal cues required ?  ?  ?HOME EXERCISE PROGRAM: ?Patient to work on proper step length with heel strike along with sit to stand x 5 every 2  hours.   ? ?Access Code: [email or web address] ?URL: [email or web address] ?Date: 06/05/2021 ?Prepared by: Juel Burrow ? ?Exercises ?- Seated Long Arc Quad  - 1 x daily - 7 x weekly - 2 sets - 10 reps ?- Seated March  - 1 x daily - 7 x weekly - 2 sets - 10 reps ?- Sit to Stand  - 1 x daily - 7 x weekly - 2 sets - 10 reps ?- Standing March with Counter Support  - 1 x daily - 7 x weekly - 2 sets - 10 reps ?- Standing Hip Abduction with Counter Support  - 1 x daily - 7 x weekly - 2 sets - 10 reps ?- Standing Hip Extension with Counter Support  - 1 x daily - 7 x weekly - 2 sets - 10 reps ?- Standing Hip Flexion with Counter Support  - 1 x daily - 7 x weekly - 2 sets - 10 reps ?  ?ASSESSMENT: ?  ?CLINICAL IMPRESSION: ?Patient demonstrates increased awareness of step through gait with right foot.  She struggled with sit to stand today.    She would benefit from continued skilled PT for LE strengthening, gait and balance training to reduce fall risk.    ?  ?OBJECTIVE IMPAIRMENTS Abnormal gait, decreased activity tolerance, decreased balance, decreased endurance, decreased knowledge of condition, decreased knowledge of use of DME, decreased mobility, difficulty walking, decreased strength, decreased safety awareness, impaired  flexibility, and postural dysfunction.  ?  ?ACTIVITY LIMITATIONS cleaning, community activity, meal prep, laundry, yard work, and shopping.  ?  ?PERSONAL FACTORS Age, Fitness, Past/current experiences, Time sin

## 2021-07-09 ENCOUNTER — Ambulatory Visit: Payer: Medicare Other | Admitting: Rehabilitative and Restorative Service Providers"

## 2021-07-09 ENCOUNTER — Encounter: Payer: Self-pay | Admitting: Rehabilitative and Restorative Service Providers"

## 2021-07-09 DIAGNOSIS — Z9181 History of falling: Secondary | ICD-10-CM | POA: Diagnosis not present

## 2021-07-09 DIAGNOSIS — R262 Difficulty in walking, not elsewhere classified: Secondary | ICD-10-CM

## 2021-07-09 DIAGNOSIS — M6281 Muscle weakness (generalized): Secondary | ICD-10-CM | POA: Diagnosis not present

## 2021-07-09 DIAGNOSIS — R2681 Unsteadiness on feet: Secondary | ICD-10-CM

## 2021-07-09 NOTE — Therapy (Signed)
?OUTPATIENT PHYSICAL THERAPY TREATMENT NOTE ? ? ?Patient Name: [name] Church ?MRN: [id number] ?DOB:07/23/1934, 87 y.o., female ?Today's Date: 07/09/2021 ? ?PCP: Jonathon Jordan, MD ?REFERRING PROVIDER: Jonathon Jordan, MD ? ?Progress Note ?Reporting Period 05/29/2021 to 07/09/2021 ? ?See note below for Objective Data and Assessment of Progress/Goals.  ? ?  ? ?END OF SESSION:  ? PT End of Session - 07/09/21 1150   ? ? Visit Number 10   ? Date for PT Re-Evaluation 07/24/21   ? Authorization Type [employer]HC Medicare   ? PT Start Time 1145   ? PT Stop Time 1225   ? PT Time Calculation (min) 40 min   ? Activity Tolerance Patient tolerated treatment well   ? Behavior During Therapy [facility] for tasks assessed/performed   ? ?  ?  ? ?  ? ? ?Past Medical History:  ?Diagnosis Date  ? Diabetes mellitus without complication ([place])   ? Edema   ? Elevated serum immunoglobulin free light chain level 11/29/2013  ? Macrocytosis without anemia 11/29/2013  ? Nonsustained ventricular tachycardia ([place])   ? 115 episodes of NSVT on mointor up to 6 beats in a row with maximum HR of 197bpm on heart monitor 06/2020  ? Osteoporosis   ? PAT (paroxysmal atrial tachycardia) ([place])   ? nonsustained atrial tachycardia up to 23 seconds at a time on heart monitor 06/2020  ? Thyroid disease   ? Unspecified deficiency anemia 11/29/2013  ? ?Past Surgical History:  ?Procedure Laterality Date  ? APPENDECTOMY    ? EYE SURGERY Left 03/2013  ? eye bleeds internally  ? EYE SURGERY Right 03/2013  ? FRACTURE SURGERY Left 2012  ? leg with metal fixation  ? ?Patient Active Problem List  ? Diagnosis Date Noted  ? History of falling 05/29/2021  ? Diabetic peripheral neuropathy associated with type 1 diabetes mellitus ([place]) 03/20/2021  ? Irregular heart beat 08/21/2020  ? PVC's (premature ventricular contractions) 07/04/2020  ? Allergic rhinitis 02/21/2020  ? Body mass index (BMI) 34.0-34.9, adult 02/21/2020  ? Callosity 02/21/2020  ? Diabetic renal disease ([place]) 02/21/2020  ?  Disorder of the skin and subcutaneous tissue, unspecified 02/21/2020  ? Essential hypertension 02/21/2020  ? Impairment of balance 02/21/2020  ? Macrocytosis 02/21/2020  ? Onychomycosis 02/21/2020  ? Other intervertebral disc degeneration, lumbar region 02/21/2020  ? Pathological fracture 02/21/2020  ? Vitamin D deficiency 02/21/2020  ? Background diabetic retinopathy ([place]) 02/21/2020  ? Senile purpura ([place]) 02/21/2020  ? Stasis dermatitis 02/21/2020  ? CKD (chronic kidney disease) stage 3, GFR 30-59 ml/min (HCC) 10/08/2016  ? Osteoporosis, postmenopausal 04/08/2016  ? Primary hypothyroidism 04/08/2016  ? Type 1 diabetes mellitus with hyperglycemia ([place]) 04/08/2016  ? Type 1 diabetes mellitus with mild nonproliferative retinopathy of both eyes without macular edema (HCC) 04/08/2016  ? Elevated serum immunoglobulin free light chain level 11/29/2013  ? Macrocytosis without anemia 11/29/2013  ? Unspecified deficiency anemia 11/29/2013  ? Edema   ? ? ?REFERRING DIAG: R26.89 (ICD-10-CM) - Other abnormalities of gait and mobility  ? ?THERAPY DIAG:  ?History of falling ? ?Difficulty walking ? ?Unsteadiness on feet ? ?Muscle weakness (generalized) ? ?PERTINENT HISTORY: L femur Fx 11 years, then L fractured foot 10 years ago ? ?PRECAUTIONS: Fall risk ? ?SUBJECTIVE: Patient denies pain, just reports stiffness.     ? ?PAIN:  ?Are you having pain? No ? ?PATIENT GOALS: To be able to walk without any assistive device and to feel comfortable and legs not get weak.   ? ?  OBJECTIVE:  ?  ?LE MMT: ?  ?MMT Right ?05/29/2021 Left ?05/29/2021  ?Hip flexion 4-/5 4-/5  ?Hip extension 3+/5 3+/5  ?Hip abduction 3+/5 3+/5  ?Hip adduction 4/5 4/5  ?Hip internal rotation 3+/5 3+/5  ?Hip external rotation 3+/5 3+/5  ?Knee flexion 4/5 4/5  ?Knee extension 4/5 4/5  ?Ankle dorsiflexion 4/5 4/5  ?Ankle plantarflexion 4/5 4/5  ?Ankle inversion 4/5 4/5  ?Ankle eversion 4/5 4/5  ? (Blank rows = not tested) ?  ?  ?FUNCTIONAL TESTS:  ? ?07/09/2021: ?5 times  sit to stand: 16.7 sec with using UE ?Timed up and go (TUG): 20.4 sec with RW ? ?05/29/2021: ?5 times sit to stand: 26.52 ?Timed up and go (TUG): 25.75 ?  ?GAIT: ? ?07/09/2021: ?3 min walk:  359 ft with RW ? ?06/05/2021: ?3 min walk:  259 ft with RW ?Comments: short shuffle step, LE's externally rotated, heavy reliance on RW ?  ?  ?  ?TODAY'S TREATMENT: ? ?07/09/2021: ?Nustep L5 x8 min (710 steps), PT present to discuss status and goals ?Objective testing (see above) ?Gait training with rolling walker: heavy verbal cues for step through with right foot, upright posture, heel strike and increased step length ?Seated with 5# bilat ankle weights:  LAQ, marching, heel/toe raises.  2x10 each ?Sit to/from stand holding 5# kettlebell with chest press x10 reps ?Seated abdominal crunch with 5 lb dumbell 2x10 ?Seated with 2# dumbbells bilat:  bicep curls, shoulder flexion, shoulder abduction, overhead press.  2x10 each ? ? ?07/04/2021: ?Nustep L5 x8 min, PT present to discuss status and goals ?Seated hip ER (knees apart, bottom of foot to opposite leg) x 20 both ?Seated toe and heel raises x 20 each ?Seated LAQ with 5 lb 2 x 10 ?Seated march x 20 with 5 lb x 20 ?Seated abdominal crunch with 5 lb dumbell ?Sit to stand from chair without UE support 2 x 10 with 5 lb kb one set and without 5 lb kb ?Seated bicep curls 2 x 10 with 2 lb dumbells both ?Seated tricep press 2 x 10 with yellow tband ?Seated horizontal abduction with yellow tband x 20 ?Gait training with rolling walker: heavy verbal cues for step through with right foot, upright posture, heel strike and increased step length ?3 min walk test: 312 feet ? ?07/02/2021: ?Nustep L5 x8 min, PT present to discuss status and goals ?Seated toe and heel raises x 20 each ?Seated LAQ with 5 lb 2 x 10 ?Seated march x 20 with 5 lb x 20 ?Seated abdominal crunch with 5 lb dumbell ?Sit to stand from table with balance pad 2 x 10 with 5 lb dumbell ?Seated bicep curls 2 x 10 with 2 lb dumbells  both ?Seated tricep press 2 x 10 with yellow tband ?Gait training with rolling walker: heavy verbal cues for step through with right foot, upright posture, heel strike and increased step length ?3 min walk test: 277 feet ? ?  ?  ?PATIENT EDUCATION:  ?Education details: Gait training: educated on heel to toe progression and proper step length along with upright posture.  Also reminded patient about appropriate assistive device and need to use regularly.   Educated on proper/safe sit to stand.   ?Person educated: Patient ?Education method: Explanation, Demonstration, and Verbal cues ?Education comprehension: verbalized understanding, returned demonstration, and verbal cues required ?  ?  ?HOME EXERCISE PROGRAM: ?Patient to work on proper step length with heel strike along with sit to stand x 5 every 2  hours.   ? ?Access Code: [email or web address] ?URL: [email or web address] ?Date: 06/05/2021 ?Prepared by: Juel Burrow ? ?Exercises ?- Seated Long Arc Quad  - 1 x daily - 7 x weekly - 2 sets - 10 reps ?- Seated March  - 1 x daily - 7 x weekly - 2 sets - 10 reps ?- Sit to Stand  - 1 x daily - 7 x weekly - 2 sets - 10 reps ?- Standing March with Counter Support  - 1 x daily - 7 x weekly - 2 sets - 10 reps ?- Standing Hip Abduction with Counter Support  - 1 x daily - 7 x weekly - 2 sets - 10 reps ?- Standing Hip Extension with Counter Support  - 1 x daily - 7 x weekly - 2 sets - 10 reps ?- Standing Hip Flexion with Counter Support  - 1 x daily - 7 x weekly - 2 sets - 10 reps ?  ?ASSESSMENT: ?  ?CLINICAL IMPRESSION: ?Ms Morath arrives with RW and ready to participate in skilled PT.  Pt has progressed with her functional test scores of TUG and 5 times sit to/from stand.  Pt still requires UE use with sit to/from stand and utilizes RW with TUG and ambulation in clinic.  Pt continues with gait abnormalities, specifically with decreased heel strike noted on RLE and placing R foot down flat with decreased stride length.  Pt  would like to rely less on RW throughout the day, specifically when in her home, but continues to require secondary to weakness and decreased balance.  PT continues to require skilled PT to progress towards goal relate

## 2021-07-11 ENCOUNTER — Encounter: Payer: Self-pay | Admitting: Rehabilitative and Restorative Service Providers"

## 2021-07-11 ENCOUNTER — Ambulatory Visit: Payer: Medicare Other | Admitting: Rehabilitative and Restorative Service Providers"

## 2021-07-11 DIAGNOSIS — R2681 Unsteadiness on feet: Secondary | ICD-10-CM | POA: Diagnosis not present

## 2021-07-11 DIAGNOSIS — M6281 Muscle weakness (generalized): Secondary | ICD-10-CM | POA: Diagnosis not present

## 2021-07-11 DIAGNOSIS — Z9181 History of falling: Secondary | ICD-10-CM | POA: Diagnosis not present

## 2021-07-11 DIAGNOSIS — R262 Difficulty in walking, not elsewhere classified: Secondary | ICD-10-CM

## 2021-07-11 NOTE — Therapy (Signed)
?OUTPATIENT PHYSICAL THERAPY TREATMENT NOTE ? ? ?Patient Name: [name] Church ?MRN: [id number] ?DOB:24-Jul-1934, 87 y.o., female ?Today's Date: 07/11/2021 ? ?PCP: Jonathon Jordan, MD ?REFERRING PROVIDER: Jonathon Jordan, MD ? ?Progress Note ?Reporting Period 05/29/2021 to 07/09/2021 ? ?See note below for Objective Data and Assessment of Progress/Goals.  ? ?  ? ?END OF SESSION:  ? PT End of Session - 07/11/21 1150   ? ? Visit Number 11   ? Date for PT Re-Evaluation 07/24/21   ? Authorization Type [employer] Medicare   ? PT Start Time 1145   ? PT Stop Time 1225   ? PT Time Calculation (min) 40 min   ? Activity Tolerance Patient tolerated treatment well   ? Behavior During Therapy [facility] for tasks assessed/performed   ? ?  ?  ? ?  ? ? ?Past Medical History:  ?Diagnosis Date  ? Diabetes mellitus without complication ([place])   ? Edema   ? Elevated serum immunoglobulin free light chain level 11/29/2013  ? Macrocytosis without anemia 11/29/2013  ? Nonsustained ventricular tachycardia ([place])   ? 115 episodes of NSVT on mointor up to 6 beats in a row with maximum HR of 197bpm on heart monitor 06/2020  ? Osteoporosis   ? PAT (paroxysmal atrial tachycardia) ([place])   ? nonsustained atrial tachycardia up to 23 seconds at a time on heart monitor 06/2020  ? Thyroid disease   ? Unspecified deficiency anemia 11/29/2013  ? ?Past Surgical History:  ?Procedure Laterality Date  ? APPENDECTOMY    ? EYE SURGERY Left 03/2013  ? eye bleeds internally  ? EYE SURGERY Right 03/2013  ? FRACTURE SURGERY Left 2012  ? leg with metal fixation  ? ?Patient Active Problem List  ? Diagnosis Date Noted  ? History of falling 05/29/2021  ? Diabetic peripheral neuropathy associated with type 1 diabetes mellitus ([place]) 03/20/2021  ? Irregular heart beat 08/21/2020  ? PVC's (premature ventricular contractions) 07/04/2020  ? Allergic rhinitis 02/21/2020  ? Body mass index (BMI) 34.0-34.9, adult 02/21/2020  ? Callosity 02/21/2020  ? Diabetic renal disease ([place]) 02/21/2020  ?  Disorder of the skin and subcutaneous tissue, unspecified 02/21/2020  ? Essential hypertension 02/21/2020  ? Impairment of balance 02/21/2020  ? Macrocytosis 02/21/2020  ? Onychomycosis 02/21/2020  ? Other intervertebral disc degeneration, lumbar region 02/21/2020  ? Pathological fracture 02/21/2020  ? Vitamin D deficiency 02/21/2020  ? Background diabetic retinopathy ([place]) 02/21/2020  ? Senile purpura ([place]) 02/21/2020  ? Stasis dermatitis 02/21/2020  ? CKD (chronic kidney disease) stage 3, GFR 30-59 ml/min (HCC) 10/08/2016  ? Osteoporosis, postmenopausal 04/08/2016  ? Primary hypothyroidism 04/08/2016  ? Type 1 diabetes mellitus with hyperglycemia ([place]) 04/08/2016  ? Type 1 diabetes mellitus with mild nonproliferative retinopathy of both eyes without macular edema (HCC) 04/08/2016  ? Elevated serum immunoglobulin free light chain level 11/29/2013  ? Macrocytosis without anemia 11/29/2013  ? Unspecified deficiency anemia 11/29/2013  ? Edema   ? ? ?REFERRING DIAG: R26.89 (ICD-10-CM) - Other abnormalities of gait and mobility  ? ?THERAPY DIAG:  ?History of falling ? ?Difficulty walking ? ?Unsteadiness on feet ? ?Muscle weakness (generalized) ? ?PERTINENT HISTORY: L femur Fx 11 years, then L fractured foot 10 years ago ? ?PRECAUTIONS: Fall risk ? ?SUBJECTIVE: Patient denies falls, states that she is doing okay.   ? ?PAIN:  ?Are you having pain? No ? ?PATIENT GOALS: To be able to walk without any assistive device and to feel comfortable and legs not get weak.   ? ?  OBJECTIVE:  ?  ?LE MMT: ?  ?MMT Right ?05/29/2021 Left ?05/29/2021  ?Hip flexion 4-/5 4-/5  ?Hip extension 3+/5 3+/5  ?Hip abduction 3+/5 3+/5  ?Hip adduction 4/5 4/5  ?Hip internal rotation 3+/5 3+/5  ?Hip external rotation 3+/5 3+/5  ?Knee flexion 4/5 4/5  ?Knee extension 4/5 4/5  ?Ankle dorsiflexion 4/5 4/5  ?Ankle plantarflexion 4/5 4/5  ?Ankle inversion 4/5 4/5  ?Ankle eversion 4/5 4/5  ? (Blank rows = not tested) ?  ?  ?FUNCTIONAL TESTS:  ? ?07/09/2021: ?5  times sit to stand: 16.7 sec with using UE ?Timed up and go (TUG): 20.4 sec with RW ? ?05/29/2021: ?5 times sit to stand: 26.52 ?Timed up and go (TUG): 25.75 ?  ?GAIT: ? ?07/09/2021: ?3 min walk:  359 ft with RW ? ?06/05/2021: ?3 min walk:  259 ft with RW ?Comments: short shuffle step, LE's externally rotated, heavy reliance on RW ?  ?  ?  ?TODAY'S TREATMENT: ? ?07/11/2021: ?Nustep L5 x8 min (690 steps), PT present to discuss status and goals ?Seated with 5# bilat ankle weights:  LAQ, marching, heel/toe raises.  2x10 each ?Sit to/from stand holding 5# kettlebell with chest press x10 reps ?Seated abdominal crunch with 5 lb dumbell 2x10 ?Seated core hip to hip with 5# dumbbell 2x10  ?Seated with 2# dumbbells bilat:  bicep curls, shoulder flexion, shoulder abduction, overhead press.  2x10 each ?Pt amb 2 x 55 ft without assistive device with CGA and cuing for taking longer stride length and initiating heel strike, especially with RLE. ? ?07/09/2021: ?Nustep L5 x8 min (710 steps), PT present to discuss status and goals ?Objective testing (see above) ?Gait training with rolling walker: heavy verbal cues for step through with right foot, upright posture, heel strike and increased step length ?Seated with 5# bilat ankle weights:  LAQ, marching, heel/toe raises.  2x10 each ?Sit to/from stand holding 5# kettlebell with chest press x10 reps ?Seated abdominal crunch with 5 lb dumbell 2x10 ?Seated with 2# dumbbells bilat:  bicep curls, shoulder flexion, shoulder abduction, overhead press.  2x10 each ? ? ?07/04/2021: ?Nustep L5 x8 min, PT present to discuss status and goals ?Seated hip ER (knees apart, bottom of foot to opposite leg) x 20 both ?Seated toe and heel raises x 20 each ?Seated LAQ with 5 lb 2 x 10 ?Seated march x 20 with 5 lb x 20 ?Seated abdominal crunch with 5 lb dumbell ?Sit to stand from chair without UE support 2 x 10 with 5 lb kb one set and without 5 lb kb ?Seated bicep curls 2 x 10 with 2 lb dumbells both ?Seated tricep  press 2 x 10 with yellow tband ?Seated horizontal abduction with yellow tband x 20 ?Gait training with rolling walker: heavy verbal cues for step through with right foot, upright posture, heel strike and increased step length ?3 min walk test: 312 feet ? ?  ?PATIENT EDUCATION:  ?Education details: Gait training: educated on heel to toe progression and proper step length along with upright posture.  Also reminded patient about appropriate assistive device and need to use regularly.   Educated on proper/safe sit to stand.   ?Person educated: Patient ?Education method: Explanation, Demonstration, and Verbal cues ?Education comprehension: verbalized understanding, returned demonstration, and verbal cues required ?  ?  ?HOME EXERCISE PROGRAM: ?Patient to work on proper step length with heel strike along with sit to stand x 5 every 2 hours.   ? ?Access Code: [email or web address] ?URL: [email or web address] ?Date:  06/05/2021 ?Prepared by: Juel Burrow ? ?Exercises ?- Seated Long Arc Quad  - 1 x daily - 7 x weekly - 2 sets - 10 reps ?- Seated March  - 1 x daily - 7 x weekly - 2 sets - 10 reps ?- Sit to Stand  - 1 x daily - 7 x weekly - 2 sets - 10 reps ?- Standing March with Counter Support  - 1 x daily - 7 x weekly - 2 sets - 10 reps ?- Standing Hip Abduction with Counter Support  - 1 x daily - 7 x weekly - 2 sets - 10 reps ?- Standing Hip Extension with Counter Support  - 1 x daily - 7 x weekly - 2 sets - 10 reps ?- Standing Hip Flexion with Counter Support  - 1 x daily - 7 x weekly - 2 sets - 10 reps ?  ?ASSESSMENT: ?  ?CLINICAL IMPRESSION: ?Ms Woodyard arrives with RW and ready to participate in skilled PT.  She was able to initiate some gait training without assistive device today, but with CGA and cuing for foot placement and stride length.  Pt continues with decreased RLE stride length and lack of heel strike.  Pt with difficulty achieving full upright standing posture during sit to/from stand secondary to weakness  with terminal knee extension.  Pt continues to require skilled PT to progress towards goal related activities. ? ? ?OBJECTIVE IMPAIRMENTS Abnormal gait, decreased activity tolerance, decreased balance, decrea

## 2021-07-16 ENCOUNTER — Ambulatory Visit: Payer: Medicare Other

## 2021-07-16 DIAGNOSIS — R262 Difficulty in walking, not elsewhere classified: Secondary | ICD-10-CM | POA: Diagnosis not present

## 2021-07-16 DIAGNOSIS — M6281 Muscle weakness (generalized): Secondary | ICD-10-CM | POA: Diagnosis not present

## 2021-07-16 DIAGNOSIS — R2681 Unsteadiness on feet: Secondary | ICD-10-CM | POA: Diagnosis not present

## 2021-07-16 DIAGNOSIS — Z9181 History of falling: Secondary | ICD-10-CM | POA: Diagnosis not present

## 2021-07-16 NOTE — Therapy (Signed)
?OUTPATIENT PHYSICAL THERAPY TREATMENT NOTE ? ? ?Patient Name: [name] Church ?MRN: [id number] ?DOB:[birth year], 87 y.o., female ?Today's Date: 07/16/2021 ? ?PCP: Jonathon Jordan, MD ?REFERRING PROVIDER: Jonathon Jordan, MD ? ?Progress Note ?Reporting Period 05/29/2021 to 07/09/2021 ? ?See note below for Objective Data and Assessment of Progress/Goals.  ? ?  ? ?END OF SESSION:  ? PT End of Session - 07/16/21 1159   ? ? Visit Number 12   ? Date for PT Re-Evaluation 07/24/21   ? Authorization Type [employer] Medicare   ? PT Start Time 1148   ? PT Stop Time 1230   ? PT Time Calculation (min) 42 min   ? Activity Tolerance Patient tolerated treatment well   ? Behavior During Therapy [facility] for tasks assessed/performed   ? ?  ?  ? ?  ? ? ?Past Medical History:  ?Diagnosis Date  ? Diabetes mellitus without complication ([place])   ? Edema   ? Elevated serum immunoglobulin free light chain level 11/29/2013  ? Macrocytosis without anemia 11/29/2013  ? Nonsustained ventricular tachycardia ([place])   ? 115 episodes of NSVT on mointor up to 6 beats in a row with maximum HR of 197bpm on heart monitor 06/2020  ? Osteoporosis   ? PAT (paroxysmal atrial tachycardia) ([place])   ? nonsustained atrial tachycardia up to 23 seconds at a time on heart monitor 06/2020  ? Thyroid disease   ? Unspecified deficiency anemia 11/29/2013  ? ?Past Surgical History:  ?Procedure Laterality Date  ? APPENDECTOMY    ? EYE SURGERY Left 03/2013  ? eye bleeds internally  ? EYE SURGERY Right 03/2013  ? FRACTURE SURGERY Left 2012  ? leg with metal fixation  ? ?Patient Active Problem List  ? Diagnosis Date Noted  ? History of falling 05/29/2021  ? Diabetic peripheral neuropathy associated with type 1 diabetes mellitus ([place]) 03/20/2021  ? Irregular heart beat 08/21/2020  ? PVC's (premature ventricular contractions) 07/04/2020  ? Allergic rhinitis 02/21/2020  ? Body mass index (BMI) 34.0-34.9, adult 02/21/2020  ? Callosity 02/21/2020  ? Diabetic renal disease ([place]) 02/21/2020  ?  Disorder of the skin and subcutaneous tissue, unspecified 02/21/2020  ? Essential hypertension 02/21/2020  ? Impairment of balance 02/21/2020  ? Macrocytosis 02/21/2020  ? Onychomycosis 02/21/2020  ? Other intervertebral disc degeneration, lumbar region 02/21/2020  ? Pathological fracture 02/21/2020  ? Vitamin D deficiency 02/21/2020  ? Background diabetic retinopathy ([place]) 02/21/2020  ? Senile purpura ([place]) 02/21/2020  ? Stasis dermatitis 02/21/2020  ? CKD (chronic kidney disease) stage 3, GFR 30-59 ml/min (HCC) 10/08/2016  ? Osteoporosis, postmenopausal 04/08/2016  ? Primary hypothyroidism 04/08/2016  ? Type 1 diabetes mellitus with hyperglycemia ([place]) 04/08/2016  ? Type 1 diabetes mellitus with mild nonproliferative retinopathy of both eyes without macular edema (HCC) 04/08/2016  ? Elevated serum immunoglobulin free light chain level 11/29/2013  ? Macrocytosis without anemia 11/29/2013  ? Unspecified deficiency anemia 11/29/2013  ? Edema   ? ? ?REFERRING DIAG: R26.89 (ICD-10-CM) - Other abnormalities of gait and mobility  ? ?THERAPY DIAG:  ?History of falling ? ?Difficulty walking ? ?Unsteadiness on feet ? ?Muscle weakness (generalized) ? ?PERTINENT HISTORY: L femur Fx 11 years, then L fractured foot 10 years ago ? ?PRECAUTIONS: Fall risk ? ?SUBJECTIVE: Patient denies any falls.   She arrives with walker and continues shuffle gait.    ? ?PAIN:  ?Are you having pain? No ? ?PATIENT GOALS: To be able to walk without any assistive device and to feel comfortable and  legs not get weak.   ? ?OBJECTIVE:  ?  ?LE MMT: ?  ?MMT Right ?05/29/2021 Left ?05/29/2021  ?Hip flexion 4-/5 4-/5  ?Hip extension 3+/5 3+/5  ?Hip abduction 3+/5 3+/5  ?Hip adduction 4/5 4/5  ?Hip internal rotation 3+/5 3+/5  ?Hip external rotation 3+/5 3+/5  ?Knee flexion 4/5 4/5  ?Knee extension 4/5 4/5  ?Ankle dorsiflexion 4/5 4/5  ?Ankle plantarflexion 4/5 4/5  ?Ankle inversion 4/5 4/5  ?Ankle eversion 4/5 4/5  ? (Blank rows = not tested) ?  ?   ?FUNCTIONAL TESTS:  ? ?07/09/2021: ?5 times sit to stand: 16.7 sec with using UE ?Timed up and go (TUG): 20.4 sec with RW ? ?05/29/2021: ?5 times sit to stand: 26.52 ?Timed up and go (TUG): 25.75 ?  ?GAIT: ? ?07/09/2021: ?3 min walk:  359 ft with RW ? ?06/05/2021: ?3 min walk:  259 ft with RW ?Comments: short shuffle step, LE's externally rotated, heavy reliance on RW ?  ?  ?  ?TODAY'S TREATMENT: ? ?07/16/2021: ?Nustep L5 x8 min, PT present to discuss status and goals ?Standing hip ext and abd 2 x 10  ?Seated with 5# bilat ankle weights:  LAQ, marching, heel/toe raises.  2x10 each ?Sit to/from stand holding 5# kettlebell with chest press x10 reps ?Seated abdominal crunch with 5 lb dumbell 2x10 ?Seated core hip to hip with 5# dumbbell 2x10  ?Lengthy discussion about home safety and her gait pattern unchanged despite heavy teaching. Discussed fall risk and living alone. Importance of discussing a plan with family to prepare for the future before an accident occurs.   ?07/11/2021: ?Nustep L5 x8 min (690 steps), PT present to discuss status and goals ?Seated with 5# bilat ankle weights:  LAQ, marching, heel/toe raises.  2x10 each ?Sit to/from stand holding 5# kettlebell with chest press x10 reps ?Seated abdominal crunch with 5 lb dumbell 2x10 ?Seated core hip to hip with 5# dumbbell 2x10  ?Seated with 2# dumbbells bilat:  bicep curls, shoulder flexion, shoulder abduction, overhead press.  2x10 each ?Pt amb 2 x 55 ft without assistive device with CGA and cuing for taking longer stride length and initiating heel strike, especially with RLE. ? ?07/09/2021: ?Nustep L5 x8 min (710 steps), PT present to discuss status and goals ?Objective testing (see above) ?Gait training with rolling walker: heavy verbal cues for step through with right foot, upright posture, heel strike and increased step length ?Seated with 5# bilat ankle weights:  LAQ, marching, heel/toe raises.  2x10 each ?Sit to/from stand holding 5# kettlebell with chest press  x10 reps ?Seated abdominal crunch with 5 lb dumbell 2x10 ?Seated with 2# dumbbells bilat:  bicep curls, shoulder flexion, shoulder abduction, overhead press.  2x10 each ? ? ?  ?PATIENT EDUCATION:  ?Education details: Gait training: educated on heel to toe progression and proper step length along with upright posture.  Also reminded patient about appropriate assistive device and need to use regularly.   Educated on proper/safe sit to stand.   ?Person educated: Patient ?Education method: Explanation, Demonstration, and Verbal cues ?Education comprehension: verbalized understanding, returned demonstration, and verbal cues required ?  ?  ?HOME EXERCISE PROGRAM: ?Patient to work on proper step length with heel strike along with sit to stand x 5 every 2 hours.   ? ?Access Code: [email or web address] ?URL: [email or web address] ?Date: 06/05/2021 ?Prepared by: Juel Burrow ? ?Exercises ?- Seated Long Arc Quad  - 1 x daily - 7 x weekly - 2 sets - 10 reps ?- Seated  March  - 1 x daily - 7 x weekly - 2 sets - 10 reps ?- Sit to Stand  - 1 x daily - 7 x weekly - 2 sets - 10 reps ?- Standing March with Counter Support  - 1 x daily - 7 x weekly - 2 sets - 10 reps ?- Standing Hip Abduction with Counter Support  - 1 x daily - 7 x weekly - 2 sets - 10 reps ?- Standing Hip Extension with Counter Support  - 1 x daily - 7 x weekly - 2 sets - 10 reps ?- Standing Hip Flexion with Counter Support  - 1 x daily - 7 x weekly - 2 sets - 10 reps ?  ?ASSESSMENT: ?  ?CLINICAL IMPRESSION: ?Ms Mccarver arrives with RW.  She continues to walk with shuffle gait pattern.  She is able to do sit to stand with improved confidence but needs verbal cues for proper weight shift fwd.  She admits she does feel more confident when she goes out and about.  She would benefit from improved compliance with HEP and a walking program.  However, she does not have access to anyone to walk outdoors with her.  Pt continues to require skilled PT to progress towards goal  related activities. ? ? ?OBJECTIVE IMPAIRMENTS Abnormal gait, decreased activity tolerance, decreased balance, decreased endurance, decreased knowledge of condition, decreased knowledge of use of DME, decreased mobility

## 2021-07-18 ENCOUNTER — Ambulatory Visit: Payer: Medicare Other

## 2021-07-18 DIAGNOSIS — M6281 Muscle weakness (generalized): Secondary | ICD-10-CM

## 2021-07-18 DIAGNOSIS — Z9181 History of falling: Secondary | ICD-10-CM | POA: Diagnosis not present

## 2021-07-18 DIAGNOSIS — R2681 Unsteadiness on feet: Secondary | ICD-10-CM | POA: Diagnosis not present

## 2021-07-18 DIAGNOSIS — R262 Difficulty in walking, not elsewhere classified: Secondary | ICD-10-CM

## 2021-07-18 NOTE — Therapy (Signed)
OUTPATIENT PHYSICAL THERAPY TREATMENT NOTE   Patient Name: [name] Church MRN: [id number] DOB:Jan 04, 1935, 87 y.o., female Today's Date: 07/18/2021  PCP: Jonathon Jordan, MD REFERRING PROVIDER: Jonathon Jordan, MD  Progress Note Reporting Period 05/29/2021 to 07/09/2021  See note below for Objective Data and Assessment of Progress/Goals.      END OF SESSION:   PT End of Session - 07/18/21 1209     Visit Number 13    Date for PT Re-Evaluation 07/24/21    Authorization Type [employer] Medicare    PT Start Time 6734    PT Stop Time 1225    PT Time Calculation (min) 40 min    Activity Tolerance Patient limited by fatigue    Behavior During Therapy [facility] for tasks assessed/performed             Past Medical History:  Diagnosis Date   Diabetes mellitus without complication ([place])    Edema    Elevated serum immunoglobulin free light chain level 11/29/2013   Macrocytosis without anemia 11/29/2013   Nonsustained ventricular tachycardia (HCC)    115 episodes of NSVT on mointor up to 6 beats in a row with maximum HR of 197bpm on heart monitor 06/2020   Osteoporosis    PAT (paroxysmal atrial tachycardia) (HCC)    nonsustained atrial tachycardia up to 23 seconds at a time on heart monitor 06/2020   Thyroid disease    Unspecified deficiency anemia 11/29/2013   Past Surgical History:  Procedure Laterality Date   APPENDECTOMY     EYE SURGERY Left 03/2013   eye bleeds internally   EYE SURGERY Right 03/2013   FRACTURE SURGERY Left 2012   leg with metal fixation   Patient Active Problem List   Diagnosis Date Noted   History of falling 05/29/2021   Diabetic peripheral neuropathy associated with type 1 diabetes mellitus ([place]) 03/20/2021   Irregular heart beat 08/21/2020   PVC's (premature ventricular contractions) 07/04/2020   Allergic rhinitis 02/21/2020   Body mass index (BMI) 34.0-34.9, adult 02/21/2020   Callosity 02/21/2020   Diabetic renal disease ([place]) 02/21/2020   Disorder of  the skin and subcutaneous tissue, unspecified 02/21/2020   Essential hypertension 02/21/2020   Impairment of balance 02/21/2020   Macrocytosis 02/21/2020   Onychomycosis 02/21/2020   Other intervertebral disc degeneration, lumbar region 02/21/2020   Pathological fracture 02/21/2020   Vitamin D deficiency 02/21/2020   Background diabetic retinopathy ([place]) 02/21/2020   Senile purpura ([place]) 02/21/2020   Stasis dermatitis 02/21/2020   CKD (chronic kidney disease) stage 3, GFR 30-59 ml/min (HCC) 10/08/2016   Osteoporosis, postmenopausal 04/08/2016   Primary hypothyroidism 04/08/2016   Type 1 diabetes mellitus with hyperglycemia ([place]) 04/08/2016   Type 1 diabetes mellitus with mild nonproliferative retinopathy of both eyes without macular edema (HCC) 04/08/2016   Elevated serum immunoglobulin free light chain level 11/29/2013   Macrocytosis without anemia 11/29/2013   Unspecified deficiency anemia 11/29/2013   Edema     REFERRING DIAG: R26.89 (ICD-10-CM) - Other abnormalities of gait and mobility   THERAPY DIAG:  History of falling  Difficulty walking  Unsteadiness on feet  Muscle weakness (generalized)  PERTINENT HISTORY: L femur Fx 11 years, then L fractured foot 10 years ago  PRECAUTIONS: Fall risk  SUBJECTIVE: Patient denies any falls.   She arrives with walker and continues shuffle gait.     PAIN:  Are you having pain? No  PATIENT GOALS: To be able to walk without any assistive device and to feel comfortable and  legs not get weak.    OBJECTIVE:    LE MMT:   MMT Right 05/29/2021 Left 05/29/2021  Hip flexion 4-/5 4-/5  Hip extension 3+/5 3+/5  Hip abduction 3+/5 3+/5  Hip adduction 4/5 4/5  Hip internal rotation 3+/5 3+/5  Hip external rotation 3+/5 3+/5  Knee flexion 4/5 4/5  Knee extension 4/5 4/5  Ankle dorsiflexion 4/5 4/5  Ankle plantarflexion 4/5 4/5  Ankle inversion 4/5 4/5  Ankle eversion 4/5 4/5   (Blank rows = not tested)     FUNCTIONAL TESTS:    07/09/2021: 5 times sit to stand: 16.7 sec with using UE Timed up and go (TUG): 20.4 sec with RW  05/29/2021: 5 times sit to stand: 26.52 Timed up and go (TUG): 25.75   GAIT:  07/09/2021: 3 min walk:  359 ft with RW  06/05/2021: 3 min walk:  259 ft with RW Comments: short shuffle step, LE's externally rotated, heavy reliance on RW       TODAY'S TREATMENT: 07/18/2021: Nustep L5 x8 min, PT present to discuss status and goals Standing hip ext and abd 2 x 10  Seated with 5# bilat ankle weights:  LAQ, marching, heel/toe raises.  2x10 each Sit to/from stand holding 5# kettlebell with chest press x10 reps (patient unable to do from chair, switched to table Seated abdominal crunch with 5 lb dumbell 2x10  07/16/2021: Nustep L5 x8 min, PT present to discuss status and goals Standing hip ext and abd 2 x 10  Seated with 5# bilat ankle weights:  LAQ, marching, heel/toe raises.  2x10 each Sit to/from stand holding 5# kettlebell with chest press x10 reps Seated abdominal crunch with 5 lb dumbell 2x10 Seated core hip to hip with 5# dumbbell 2x10  Lengthy discussion about home safety and her gait pattern unchanged despite heavy teaching. Discussed fall risk and living alone. Importance of discussing a plan with family to prepare for the future before an accident occurs.   07/11/2021: Nustep L5 x8 min (690 steps), PT present to discuss status and goals Seated with 5# bilat ankle weights:  LAQ, marching, heel/toe raises.  2x10 each Sit to/from stand holding 5# kettlebell with chest press x10 reps Seated abdominal crunch with 5 lb dumbell 2x10 Seated core hip to hip with 5# dumbbell 2x10  Seated with 2# dumbbells bilat:  bicep curls, shoulder flexion, shoulder abduction, overhead press.  2x10 each Pt amb 2 x 55 ft without assistive device with CGA and cuing for taking longer stride length and initiating heel strike, especially with RLE.      PATIENT EDUCATION:  Education details: Gait training:  educated on heel to toe progression and proper step length along with upright posture.  Also reminded patient about appropriate assistive device and need to use regularly.   Educated on proper/safe sit to stand.   Person educated: Patient Education method: Explanation, Demonstration, and Verbal cues Education comprehension: verbalized understanding, returned demonstration, and verbal cues required     HOME EXERCISE PROGRAM: Patient to work on proper step length with heel strike along with sit to stand x 5 every 2 hours.    Access Code: [email or web address] URL: [email or web address] Date: 06/05/2021 Prepared by: Juel Burrow  Exercises - Seated Long Arc Quad  - 1 x daily - 7 x weekly - 2 sets - 10 reps - Seated March  - 1 x daily - 7 x weekly - 2 sets - 10 reps - Sit to Stand  - 1 x daily - 7  x weekly - 2 sets - 10 reps - Standing March with Counter Support  - 1 x daily - 7 x weekly - 2 sets - 10 reps - Standing Hip Abduction with Counter Support  - 1 x daily - 7 x weekly - 2 sets - 10 reps - Standing Hip Extension with Counter Support  - 1 x daily - 7 x weekly - 2 sets - 10 reps - Standing Hip Flexion with Counter Support  - 1 x daily - 7 x weekly - 2 sets - 10 reps   ASSESSMENT:   CLINICAL IMPRESSION: Ms Parco walked in with improved gait today and maintained this throughout session.    She is able to do sit to stand with improved confidence but needs verbal cues for proper weight shift fwd.   She would benefit from improved compliance with HEP and a walking program.  However, she does not have access to anyone to walk outdoors with her.  Pt continues to require skilled PT to progress towards goal related activities.   OBJECTIVE IMPAIRMENTS Abnormal gait, decreased activity tolerance, decreased balance, decreased endurance, decreased knowledge of condition, decreased knowledge of use of DME, decreased mobility, difficulty walking, decreased strength, decreased safety awareness,  impaired flexibility, and postural dysfunction.    ACTIVITY LIMITATIONS cleaning, community activity, meal prep, laundry, yard work, and shopping.    PERSONAL FACTORS Age, Fitness, Past/current experiences, Time since onset of injury/illness/exacerbation, and 3+ comorbidities: htn, diabetes and obesity  are also affecting patient's functional outcome.      REHAB POTENTIAL: Fair due to multiple co-morbidities.   CLINICAL DECISION MAKING: Evolving/moderate complexity   EVALUATION COMPLEXITY: Moderate     GOALS: Goals reviewed with patient? Yes   SHORT TERM GOALS: Target date: 06/26/21   Patient will be independent with initial HEP  Baseline: Goal status: GOAL MET   2.  Patient to demonstrate consistent use of proper assistive device for reducing fall risk.  Baseline:  Goal status: GOAL MET   3.  Patient to be able to do 5 times sit to stand properly  Baseline:  Goal status: GOAL MET   4.  Patient to be able to walk 100 feet without rest break Baseline:  Goal status: GOAL MET     LONG TERM GOALS: Target date: 07/24/2021   Patient to be independent with advanced HEP  Baseline:  Goal status: ONGOING   2.  Patient to obtain proper assistive device and be able to use safely (rollator recommended) Baseline:  Goal status: ONGOING   3.  Patient to be able to walk 300 feet with appropriate/safest assistive device without rest Baseline:  Goal status: ONGOING   4.  Patient functional tests to improve as follows: 5 times sit to stand to 22 sec, TUG to 24 sec.  Baseline:  Goal status: goal met with RW   5.  Patient to report no falls during episode of PT Baseline:  Goal status: ONGOING     PLAN: PT FREQUENCY: 2x/week   PT DURATION: 8 weeks   PLANNED INTERVENTIONS: Therapeutic exercises, Therapeutic activity, Neuromuscular re-education, Balance training, Gait training, Patient/Family education, Joint mobilization, Stair training, DME instructions, Aquatic Therapy, Dry  Needling, Electrical stimulation, Cryotherapy, Moist heat, Taping, Vasopneumatic device, Ultrasound, Ionotophoresis 37m/ml Dexamethasone, and Manual therapy   PLAN FOR NEXT SESSION:  Progress ambulation without RW, LE strengthening, balance training, NuSTep   Jamyiah Labella B. Paddy Walthall, PT 07/18/21 12:42 PM    [facility] [address] [place]  [place] [address] Phone # [phone] Fax [phone]

## 2021-07-23 ENCOUNTER — Ambulatory Visit: Payer: Medicare Other

## 2021-07-23 DIAGNOSIS — Z9181 History of falling: Secondary | ICD-10-CM | POA: Diagnosis not present

## 2021-07-23 DIAGNOSIS — R2681 Unsteadiness on feet: Secondary | ICD-10-CM | POA: Diagnosis not present

## 2021-07-23 DIAGNOSIS — M6281 Muscle weakness (generalized): Secondary | ICD-10-CM

## 2021-07-23 DIAGNOSIS — R262 Difficulty in walking, not elsewhere classified: Secondary | ICD-10-CM | POA: Diagnosis not present

## 2021-07-23 NOTE — Therapy (Addendum)
OUTPATIENT PHYSICAL THERAPY RECERT NOTE   Patient Name: [name] Church MRN: [id number] DOB:September 10, 1934, 87 y.o., female Today's Date: 07/23/2021  PCP: Jonathon Jordan, MD REFERRING PROVIDER: Jonathon Jordan, MD  Progress Note Reporting Period 05/29/2021 to 07/09/2021  See note below for Objective Data and Assessment of Progress/Goals.      END OF SESSION:   PT End of Session - 07/23/21 1149     Visit Number 14    Date for PT Re-Evaluation 07/24/21    Authorization Type [employer]HC Medicare    PT Start Time 1150    PT Stop Time 1232    PT Time Calculation (min) 42 min    Activity Tolerance Patient limited by fatigue    Behavior During Therapy [facility] for tasks assessed/performed             Past Medical History:  Diagnosis Date   Diabetes mellitus without complication ([place])    Edema    Elevated serum immunoglobulin free light chain level 11/29/2013   Macrocytosis without anemia 11/29/2013   Nonsustained ventricular tachycardia (HCC)    115 episodes of NSVT on mointor up to 6 beats in a row with maximum HR of 197bpm on heart monitor 06/2020   Osteoporosis    PAT (paroxysmal atrial tachycardia) (HCC)    nonsustained atrial tachycardia up to 23 seconds at a time on heart monitor 06/2020   Thyroid disease    Unspecified deficiency anemia 11/29/2013   Past Surgical History:  Procedure Laterality Date   APPENDECTOMY     EYE SURGERY Left 03/2013   eye bleeds internally   EYE SURGERY Right 03/2013   FRACTURE SURGERY Left 2012   leg with metal fixation   Patient Active Problem List   Diagnosis Date Noted   History of falling 05/29/2021   Diabetic peripheral neuropathy associated with type 1 diabetes mellitus ([place]) 03/20/2021   Irregular heart beat 08/21/2020   PVC's (premature ventricular contractions) 07/04/2020   Allergic rhinitis 02/21/2020   Body mass index (BMI) 34.0-34.9, adult 02/21/2020   Callosity 02/21/2020   Diabetic renal disease ([place]) 02/21/2020   Disorder of the  skin and subcutaneous tissue, unspecified 02/21/2020   Essential hypertension 02/21/2020   Impairment of balance 02/21/2020   Macrocytosis 02/21/2020   Onychomycosis 02/21/2020   Other intervertebral disc degeneration, lumbar region 02/21/2020   Pathological fracture 02/21/2020   Vitamin D deficiency 02/21/2020   Background diabetic retinopathy ([place]) 02/21/2020   Senile purpura ([place]) 02/21/2020   Stasis dermatitis 02/21/2020   CKD (chronic kidney disease) stage 3, GFR 30-59 ml/min (HCC) 10/08/2016   Osteoporosis, postmenopausal 04/08/2016   Primary hypothyroidism 04/08/2016   Type 1 diabetes mellitus with hyperglycemia ([place]) 04/08/2016   Type 1 diabetes mellitus with mild nonproliferative retinopathy of both eyes without macular edema (HCC) 04/08/2016   Elevated serum immunoglobulin free light chain level 11/29/2013   Macrocytosis without anemia 11/29/2013   Unspecified deficiency anemia 11/29/2013   Edema     REFERRING DIAG: R26.89 (ICD-10-CM) - Other abnormalities of gait and mobility   THERAPY DIAG:  History of falling  Difficulty walking  Unsteadiness on feet  Muscle weakness (generalized)  PERTINENT HISTORY: L femur Fx 11 years, then L fractured foot 10 years ago  PRECAUTIONS: Fall risk  SUBJECTIVE: Patient denies any falls since beginning PT.  She reports she feels about 50% better with regard to confidence and getting around her house.   She arrives with walker and continues shuffle gait.   She went to medical supply and  they did not have any of the walkers with the easy open/close option.  However, the rep oiled hers and added skis to back legs and issues "sock" to put on skis for hardwood floors.    PAIN:  Are you having pain? No  PATIENT GOALS: To be able to walk without any assistive device and to feel comfortable and legs not get weak.    OBJECTIVE:    LE MMT:   MMT Right 05/29/2021 Left 05/29/2021  Hip flexion 4-/5 4-/5  Hip extension 3+/5 3+/5  Hip  abduction 3+/5 3+/5  Hip adduction 4/5 4/5  Hip internal rotation 3+/5 3+/5  Hip external rotation 3+/5 3+/5  Knee flexion 4/5 4/5  Knee extension 4/5 4/5  Ankle dorsiflexion 4/5 4/5  Ankle plantarflexion 4/5 4/5  Ankle inversion 4/5 4/5  Ankle eversion 4/5 4/5   (Blank rows = not tested) LE MMT:   MMT Right 07/30/2021 Left 07/30/2021  Hip flexion 4/5 4/5  Hip extension 4-/5 4-/5  Hip abduction 4/5 4/5  Hip adduction 4+/5 4+/5  Hip internal rotation 4/5 4/5  Hip external rotation 3+/5 4-/5  Knee flexion 4/5 4/5  Knee extension 4/5 4+/5  Ankle dorsiflexion 4/5 4/5  Ankle plantarflexion 4/5 4/5  Ankle inversion 4/5 4/5  Ankle eversion 4/5 4/5   (Blank rows = not tested)     FUNCTIONAL TESTS:   07/09/2021: 5 times sit to stand: 15.5 sec without using UE Timed up and go (TUG): 17.08 sec with RW  07/09/2021: 5 times sit to stand: 17.7 sec without using UE Timed up and go (TUG): 20.4 sec with RW  05/29/2021: 5 times sit to stand: 26.52 Timed up and go (TUG): 25.75   GAIT:  07/09/2021: 3 min walk:  376 ft with RW  07/09/2021: 3 min walk:  359 ft with RW  06/05/2021: 3 min walk:  259 ft with RW Comments: short shuffle step, LE's externally rotated, heavy reliance on RW       TODAY'S TREATMENT: 07/23/2021: Nustep L5 x8 min, PT present to discuss status and goals Re-assessment for Recert Gait training during 3 min walk test, reminders to keep equal step length and step through gait.  Min vc's required for this.    07/18/2021: Nustep L5 x8 min, PT present to discuss status and goals Standing hip ext and abd 2 x 10  Seated with 5# bilat ankle weights:  LAQ, marching, heel/toe raises.  2x10 each Sit to/from stand holding 5# kettlebell with chest press x10 reps (patient unable to do from chair, switched to table Seated abdominal crunch with 5 lb dumbell 2x10  07/16/2021: Nustep L5 x8 min, PT present to discuss status and goals Standing hip ext and abd 2 x 10  Seated with  5# bilat ankle weights:  LAQ, marching, heel/toe raises.  2x10 each Sit to/from stand holding 5# kettlebell with chest press x10 reps Seated abdominal crunch with 5 lb dumbell 2x10 Seated core hip to hip with 5# dumbbell 2x10  Lengthy discussion about home safety and her gait pattern unchanged despite heavy teaching. Discussed fall risk and living alone. Importance of discussing a plan with family to prepare for the future before an accident occurs.   07/11/2021: Nustep L5 x8 min (690 steps), PT present to discuss status and goals Seated with 5# bilat ankle weights:  LAQ, marching, heel/toe raises.  2x10 each Sit to/from stand holding 5# kettlebell with chest press x10 reps Seated abdominal crunch with 5 lb dumbell 2x10 Seated  core hip to hip with 5# dumbbell 2x10  Seated with 2# dumbbells bilat:  bicep curls, shoulder flexion, shoulder abduction, overhead press.  2x10 each Pt amb 2 x 55 ft without assistive device with CGA and cuing for taking longer stride length and initiating heel strike, especially with RLE.      PATIENT EDUCATION:  Education details: Gait training: educated on heel to toe progression and proper step length along with upright posture.  Also reminded patient about appropriate assistive device and need to use regularly.   Educated on proper/safe sit to stand.   Person educated: Patient Education method: Explanation, Demonstration, and Verbal cues Education comprehension: verbalized understanding, returned demonstration, and verbal cues required     HOME EXERCISE PROGRAM: Patient to work on proper step length with heel strike along with sit to stand x 5 every 2 hours.    Access Code: [email or web address] URL: [email or web address] Date: 06/05/2021 Prepared by: Juel Burrow  Exercises - Seated Long Arc Quad  - 1 x daily - 7 x weekly - 2 sets - 10 reps - Seated March  - 1 x daily - 7 x weekly - 2 sets - 10 reps - Sit to Stand  - 1 x daily - 7 x weekly - 2 sets -  10 reps - Standing March with Counter Support  - 1 x daily - 7 x weekly - 2 sets - 10 reps - Standing Hip Abduction with Counter Support  - 1 x daily - 7 x weekly - 2 sets - 10 reps - Standing Hip Extension with Counter Support  - 1 x daily - 7 x weekly - 2 sets - 10 reps - Standing Hip Flexion with Counter Support  - 1 x daily - 7 x weekly - 2 sets - 10 reps   ASSESSMENT:   CLINICAL IMPRESSION: Ms Duggin demonstrates significant improvement with all objective findings. She confirms that she feels more confident an balanced but would like to continue to gain strength to feel able to do transfers with increased ease.  She still feels as if she tires easily but not nearly as bad as she did before starting PT.  This is evident in her walk tests and other functional tests.    Pt continues to require skilled PT to progress towards goal related activities.   OBJECTIVE IMPAIRMENTS Abnormal gait, decreased activity tolerance, decreased balance, decreased endurance, decreased knowledge of condition, decreased knowledge of use of DME, decreased mobility, difficulty walking, decreased strength, decreased safety awareness, impaired flexibility, and postural dysfunction.    ACTIVITY LIMITATIONS cleaning, community activity, meal prep, laundry, yard work, and shopping.    PERSONAL FACTORS Age, Fitness, Past/current experiences, Time since onset of injury/illness/exacerbation, and 3+ comorbidities: htn, diabetes and obesity  are also affecting patient's functional outcome.      REHAB POTENTIAL: Fair due to multiple co-morbidities.   CLINICAL DECISION MAKING: Evolving/moderate complexity   EVALUATION COMPLEXITY: Moderate     GOALS: Goals reviewed with patient? Yes   SHORT TERM GOALS: Target date: 06/26/21   Patient will be independent with initial HEP  Baseline: Goal status: GOAL MET   2.  Patient to demonstrate consistent use of proper assistive device for reducing fall risk.  Baseline:  Goal  status: GOAL MET   3.  Patient to be able to do 5 times sit to stand properly  Baseline:  Goal status: GOAL MET   4.  Patient to be able to walk 100 feet without rest break Baseline:  Goal  status: GOAL MET     LONG TERM GOALS: Target date: 07/24/2021 (new target date 08/30/21)   Patient to be independent with advanced HEP  Baseline:  Goal status: ONGOING   2.  Patient to obtain proper assistive device and be able to use safely (rollator recommended) Baseline:  Goal status: ONGOING   3.  Patient to be able to walk 300 feet with appropriate/safest assistive device without rest Baseline:  Goal status: MET   4.  Patient functional tests to improve as follows: 5 times sit to stand to 22 sec, TUG to 24 sec.  Baseline:  Goal status: goal met with RW   5.  Patient to report no falls during episode of PT Baseline:  Goal status: ONGOING     PLAN: PT FREQUENCY: 2x/week   PT DURATION: 8 weeks   PLANNED INTERVENTIONS: Therapeutic exercises, Therapeutic activity, Neuromuscular re-education, Balance training, Gait training, Patient/Family education, Joint mobilization, Stair training, DME instructions, Aquatic Therapy, Dry Needling, Electrical stimulation, Cryotherapy, Moist heat, Taping, Vasopneumatic device, Ultrasound, Ionotophoresis 67m/ml Dexamethasone, and Manual therapy   PLAN FOR NEXT SESSION:  Progress ambulation without RW, LE strengthening, balance training, NuSTep   Aren Pryde B. Mandeep Kiser, PT 07/23/21 12:59 PM    [id number] [place] [id number] # [phone] [phone]

## 2021-07-30 ENCOUNTER — Ambulatory Visit: Payer: Medicare Other

## 2021-07-30 DIAGNOSIS — R2681 Unsteadiness on feet: Secondary | ICD-10-CM | POA: Diagnosis not present

## 2021-07-30 DIAGNOSIS — Z9181 History of falling: Secondary | ICD-10-CM

## 2021-07-30 DIAGNOSIS — R262 Difficulty in walking, not elsewhere classified: Secondary | ICD-10-CM

## 2021-07-30 DIAGNOSIS — M6281 Muscle weakness (generalized): Secondary | ICD-10-CM | POA: Diagnosis not present

## 2021-07-30 NOTE — Therapy (Signed)
OUTPATIENT PHYSICAL THERAPY RECERT NOTE   Patient Name: [name] Church MRN: [id number] DOB:16-Dec-1934, 87 y.o., female Today's Date: 07/30/2021  PCP: Jonathon Jordan, MD REFERRING PROVIDER: Jonathon Jordan, MD  Progress Note Reporting Period 05/29/2021 to 07/09/2021  See note below for Objective Data and Assessment of Progress/Goals.      END OF SESSION:   PT End of Session - 07/30/21 1200     Visit Number 15    Date for PT Re-Evaluation 08/23/21    Authorization Type [employer] Medicare    Progress Note Due on Visit 20    PT Start Time 1145    PT Stop Time 1230    PT Time Calculation (min) 45 min    Activity Tolerance Patient limited by fatigue    Behavior During Therapy [facility] for tasks assessed/performed             Past Medical History:  Diagnosis Date   Diabetes mellitus without complication ([place])    Edema    Elevated serum immunoglobulin free light chain level 11/29/2013   Macrocytosis without anemia 11/29/2013   Nonsustained ventricular tachycardia (HCC)    115 episodes of NSVT on mointor up to 6 beats in a row with maximum HR of 197bpm on heart monitor 06/2020   Osteoporosis    PAT (paroxysmal atrial tachycardia) (HCC)    nonsustained atrial tachycardia up to 23 seconds at a time on heart monitor 06/2020   Thyroid disease    Unspecified deficiency anemia 11/29/2013   Past Surgical History:  Procedure Laterality Date   APPENDECTOMY     EYE SURGERY Left 03/2013   eye bleeds internally   EYE SURGERY Right 03/2013   FRACTURE SURGERY Left 2012   leg with metal fixation   Patient Active Problem List   Diagnosis Date Noted   History of falling 05/29/2021   Diabetic peripheral neuropathy associated with type 1 diabetes mellitus ([place]) 03/20/2021   Irregular heart beat 08/21/2020   PVC's (premature ventricular contractions) 07/04/2020   Allergic rhinitis 02/21/2020   Body mass index (BMI) 34.0-34.9, adult 02/21/2020   Callosity 02/21/2020   Diabetic renal disease  ([place]) 02/21/2020   Disorder of the skin and subcutaneous tissue, unspecified 02/21/2020   Essential hypertension 02/21/2020   Impairment of balance 02/21/2020   Macrocytosis 02/21/2020   Onychomycosis 02/21/2020   Other intervertebral disc degeneration, lumbar region 02/21/2020   Pathological fracture 02/21/2020   Vitamin D deficiency 02/21/2020   Background diabetic retinopathy ([place]) 02/21/2020   Senile purpura ([place]) 02/21/2020   Stasis dermatitis 02/21/2020   CKD (chronic kidney disease) stage 3, GFR 30-59 ml/min (HCC) 10/08/2016   Osteoporosis, postmenopausal 04/08/2016   Primary hypothyroidism 04/08/2016   Type 1 diabetes mellitus with hyperglycemia ([place]) 04/08/2016   Type 1 diabetes mellitus with mild nonproliferative retinopathy of both eyes without macular edema (HCC) 04/08/2016   Elevated serum immunoglobulin free light chain level 11/29/2013   Macrocytosis without anemia 11/29/2013   Unspecified deficiency anemia 11/29/2013   Edema     REFERRING DIAG: R26.89 (ICD-10-CM) - Other abnormalities of gait and mobility   THERAPY DIAG:  History of falling  Difficulty walking  Unsteadiness on feet  Muscle weakness (generalized)  PERTINENT HISTORY: L femur Fx 11 years, then L fractured foot 10 years ago  PRECAUTIONS: Fall risk  SUBJECTIVE: Patient denies any problems since last visit.  She has new walker today that is much easier to fold up and put in and take out of her car.  She walked with  a rollator walker with her dtr over the weekend and did pretty good with this but admits her dtr had to keep telling her to take bigger steps.  She was able to do sit to stand from chair without cushion today x 5 independently.  She is gaining strength but her fear of falling seems to interfere with her ability to change her motor pattern of gait.  She continues to take short shuffle steps unless therapist or her dtr are constantly reminding her.      PAIN:  Are you having pain?  No  PATIENT GOALS: To be able to walk without any assistive device and to feel comfortable and legs not get weak.    OBJECTIVE:    LE MMT:   MMT Right 05/29/2021 Left 05/29/2021  Hip flexion 4-/5 4-/5  Hip extension 3+/5 3+/5  Hip abduction 3+/5 3+/5  Hip adduction 4/5 4/5  Hip internal rotation 3+/5 3+/5  Hip external rotation 3+/5 3+/5  Knee flexion 4/5 4/5  Knee extension 4/5 4/5  Ankle dorsiflexion 4/5 4/5  Ankle plantarflexion 4/5 4/5  Ankle inversion 4/5 4/5  Ankle eversion 4/5 4/5   (Blank rows = not tested) LE MMT:   MMT Right 07/23/2021 Left 07/23/2021  Hip flexion 4/5 4/5  Hip extension 4-/5 4-/5  Hip abduction 4/5 4/5  Hip adduction 4+/5 4+/5  Hip internal rotation 4/5 4/5  Hip external rotation 3+/5 4-/5  Knee flexion 4/5 4/5  Knee extension 4/5 4+/5  Ankle dorsiflexion 4/5 4/5  Ankle plantarflexion 4/5 4/5  Ankle inversion 4/5 4/5  Ankle eversion 4/5 4/5   (Blank rows = not tested)     FUNCTIONAL TESTS:   07/09/2021: 5 times sit to stand: 15.5 sec without using UE Timed up and go (TUG): 17.08 sec with RW  07/09/2021: 5 times sit to stand: 17.7 sec without using UE Timed up and go (TUG): 20.4 sec with RW  05/29/2021: 5 times sit to stand: 26.52 Timed up and go (TUG): 25.75   GAIT:  07/09/2021: 3 min walk:  376 ft with RW  07/09/2021: 3 min walk:  359 ft with RW  06/05/2021: 3 min walk:  259 ft with RW Comments: short shuffle step, LE's externally rotated, heavy reliance on RW       TODAY'S TREATMENT: 07/30/2021: Nustep L5 x8 min, PT present to discuss status and goals Standing at bar: toe raises, hip abd, and hip extension bilaterally x 20 each Lateral band walks x 5 laps (blue loop) Seated clam with blue loop x 20 Seated LAQ and march x 20 each with 6 lb Sit to stand 2 x 5 without UE use Standing tap up on cone with min assist from PT alternating each foot.  (Patient could not do this without holding onto bar initially, then when placed  away from bar, she was able) x 20 Step up 6" step x 10 each LE   07/23/2021: Nustep L5 x8 min, PT present to discuss status and goals Re-assessment for Recert Gait training during 3 min walk test, reminders to keep equal step length and step through gait.  Min vc's required for this.    07/18/2021: Nustep L5 x8 min, PT present to discuss status and goals Standing hip ext and abd 2 x 10  Seated with 5# bilat ankle weights:  LAQ, marching, heel/toe raises.  2x10 each Sit to/from stand holding 5# kettlebell with chest press x10 reps (patient unable to do from chair, switched to table Seated  abdominal crunch with 5 lb dumbell 2x10      PATIENT EDUCATION:  Education details: Gait training: educated on heel to toe progression and proper step length along with upright posture.  Also reminded patient about appropriate assistive device and need to use regularly.   Educated on proper/safe sit to stand.   Person educated: Patient Education method: Explanation, Demonstration, and Verbal cues Education comprehension: verbalized understanding, returned demonstration, and verbal cues required     HOME EXERCISE PROGRAM: Patient to work on proper step length with heel strike along with sit to stand x 5 every 2 hours.    Access Code: [email or web address] URL: [email or web address] Date: 06/05/2021 Prepared by: Juel Burrow  Exercises - Seated Long Arc Quad  - 1 x daily - 7 x weekly - 2 sets - 10 reps - Seated March  - 1 x daily - 7 x weekly - 2 sets - 10 reps - Sit to Stand  - 1 x daily - 7 x weekly - 2 sets - 10 reps - Standing March with Counter Support  - 1 x daily - 7 x weekly - 2 sets - 10 reps - Standing Hip Abduction with Counter Support  - 1 x daily - 7 x weekly - 2 sets - 10 reps - Standing Hip Extension with Counter Support  - 1 x daily - 7 x weekly - 2 sets - 10 reps - Standing Hip Flexion with Counter Support  - 1 x daily - 7 x weekly - 2 sets - 10 reps   ASSESSMENT:    CLINICAL IMPRESSION: Ms Pelle appears to be gaining strength and function but continues to have fear of falling that interferes with her gait and normal motor patterns.  She continues to have shuffle gait despite being able to walk with normal stride length with verbal cues from PT or her dtr.     Pt continues to require skilled PT to progress towards goal related activities.   OBJECTIVE IMPAIRMENTS Abnormal gait, decreased activity tolerance, decreased balance, decreased endurance, decreased knowledge of condition, decreased knowledge of use of DME, decreased mobility, difficulty walking, decreased strength, decreased safety awareness, impaired flexibility, and postural dysfunction.    ACTIVITY LIMITATIONS cleaning, community activity, meal prep, laundry, yard work, and shopping.    PERSONAL FACTORS Age, Fitness, Past/current experiences, Time since onset of injury/illness/exacerbation, and 3+ comorbidities: htn, diabetes and obesity  are also affecting patient's functional outcome.      REHAB POTENTIAL: Fair due to multiple co-morbidities.   CLINICAL DECISION MAKING: Evolving/moderate complexity   EVALUATION COMPLEXITY: Moderate     GOALS: Goals reviewed with patient? Yes   SHORT TERM GOALS: Target date: 06/26/21   Patient will be independent with initial HEP  Baseline: Goal status: GOAL MET   2.  Patient to demonstrate consistent use of proper assistive device for reducing fall risk.  Baseline:  Goal status: GOAL MET   3.  Patient to be able to do 5 times sit to stand properly  Baseline:  Goal status: GOAL MET   4.  Patient to be able to walk 100 feet without rest break Baseline:  Goal status: GOAL MET     LONG TERM GOALS: Target date: 07/24/2021 (new target date 08/30/21)   Patient to be independent with advanced HEP  Baseline:  Goal status: ONGOING   2.  Patient to obtain proper assistive device and be able to use safely (rollator recommended) Baseline:  Goal  status: ONGOING   3.  Patient to  be able to walk 300 feet with appropriate/safest assistive device without rest Baseline:  Goal status: MET   4.  Patient functional tests to improve as follows: 5 times sit to stand to 22 sec, TUG to 24 sec.  Baseline:  Goal status: goal met with RW   5.  Patient to report no falls during episode of PT Baseline:  Goal status: ONGOING     PLAN: PT FREQUENCY: 2x/week   PT DURATION: 4 more weeks   PLANNED INTERVENTIONS: Therapeutic exercises, Therapeutic activity, Neuromuscular re-education, Balance training, Gait training, Patient/Family education, Joint mobilization, Stair training, DME instructions, Aquatic Therapy, Dry Needling, Electrical stimulation, Cryotherapy, Moist heat, Taping, Vasopneumatic device, Ultrasound, Ionotophoresis 61m/ml Dexamethasone, and Manual therapy   PLAN FOR NEXT SESSION:  Extend PT for 4 more weeks.  2 times per week.  Progress ambulation without RW, LE strengthening, balance training, NuSTep   Nikiesha Milford B. Cody Oliger, PT 07/30/21 2:27 PM    [address] [place] [address]Phone # [phone] [phone]

## 2021-08-01 ENCOUNTER — Encounter: Payer: Self-pay | Admitting: Rehabilitative and Restorative Service Providers"

## 2021-08-01 ENCOUNTER — Ambulatory Visit: Payer: Medicare Other | Attending: Family Medicine | Admitting: Rehabilitative and Restorative Service Providers"

## 2021-08-01 DIAGNOSIS — R2681 Unsteadiness on feet: Secondary | ICD-10-CM | POA: Diagnosis not present

## 2021-08-01 DIAGNOSIS — M6281 Muscle weakness (generalized): Secondary | ICD-10-CM | POA: Insufficient documentation

## 2021-08-01 DIAGNOSIS — R262 Difficulty in walking, not elsewhere classified: Secondary | ICD-10-CM | POA: Diagnosis not present

## 2021-08-01 DIAGNOSIS — Z9181 History of falling: Secondary | ICD-10-CM | POA: Insufficient documentation

## 2021-08-01 NOTE — Therapy (Signed)
OUTPATIENT PHYSICAL THERAPY RECERT NOTE   Patient Name: [name] Church MRN: [id number] DOB:16-Sep-1934, 87 y.o., female Today's Date: 08/01/2021  PCP: Jonathon Jordan, MD REFERRING PROVIDER: Jonathon Jordan, MD  Progress Note Reporting Period 05/29/2021 to 07/09/2021  See note below for Objective Data and Assessment of Progress/Goals.      END OF SESSION:   PT End of Session - 08/01/21 1020     Visit Number 16    Date for PT Re-Evaluation 08/23/21    Authorization Type [employer] Medicare    Progress Note Due on Visit 20    PT Start Time 1015    PT Stop Time 1055    PT Time Calculation (min) 40 min    Activity Tolerance Patient limited by fatigue    Behavior During Therapy [facility] for tasks assessed/performed             Past Medical History:  Diagnosis Date   Diabetes mellitus without complication ([place])    Edema    Elevated serum immunoglobulin free light chain level 11/29/2013   Macrocytosis without anemia 11/29/2013   Nonsustained ventricular tachycardia (HCC)    115 episodes of NSVT on mointor up to 6 beats in a row with maximum HR of 197bpm on heart monitor 06/2020   Osteoporosis    PAT (paroxysmal atrial tachycardia) (HCC)    nonsustained atrial tachycardia up to 23 seconds at a time on heart monitor 06/2020   Thyroid disease    Unspecified deficiency anemia 11/29/2013   Past Surgical History:  Procedure Laterality Date   APPENDECTOMY     EYE SURGERY Left 03/2013   eye bleeds internally   EYE SURGERY Right 03/2013   FRACTURE SURGERY Left 2012   leg with metal fixation   Patient Active Problem List   Diagnosis Date Noted   History of falling 05/29/2021   Diabetic peripheral neuropathy associated with type 1 diabetes mellitus ([place]) 03/20/2021   Irregular heart beat 08/21/2020   PVC's (premature ventricular contractions) 07/04/2020   Allergic rhinitis 02/21/2020   Body mass index (BMI) 34.0-34.9, adult 02/21/2020   Callosity 02/21/2020   Diabetic renal disease  ([place]) 02/21/2020   Disorder of the skin and subcutaneous tissue, unspecified 02/21/2020   Essential hypertension 02/21/2020   Impairment of balance 02/21/2020   Macrocytosis 02/21/2020   Onychomycosis 02/21/2020   Other intervertebral disc degeneration, lumbar region 02/21/2020   Pathological fracture 02/21/2020   Vitamin D deficiency 02/21/2020   Background diabetic retinopathy ([place]) 02/21/2020   Senile purpura ([place]) 02/21/2020   Stasis dermatitis 02/21/2020   CKD (chronic kidney disease) stage 3, GFR 30-59 ml/min (HCC) 10/08/2016   Osteoporosis, postmenopausal 04/08/2016   Primary hypothyroidism 04/08/2016   Type 1 diabetes mellitus with hyperglycemia ([place]) 04/08/2016   Type 1 diabetes mellitus with mild nonproliferative retinopathy of both eyes without macular edema (HCC) 04/08/2016   Elevated serum immunoglobulin free light chain level 11/29/2013   Macrocytosis without anemia 11/29/2013   Unspecified deficiency anemia 11/29/2013   Edema     REFERRING DIAG: R26.89 (ICD-10-CM) - Other abnormalities of gait and mobility   THERAPY DIAG:  History of falling  Difficulty walking  Unsteadiness on feet  Muscle weakness (generalized)  PERTINENT HISTORY: L femur Fx 11 years, then L fractured foot 10 years ago  PRECAUTIONS: Fall risk  SUBJECTIVE: Patient denies any problems since last visit.  She has new walker today that is much easier to fold up and put in and take out of her car.  She walked with  a rollator walker with her dtr over the weekend and did pretty good with this but admits her dtr had to keep telling her to take bigger steps.  She was able to do sit to stand from chair without cushion today x 5 independently.  She is gaining strength but her fear of falling seems to interfere with her ability to change her motor pattern of gait.  She continues to take short shuffle steps unless therapist or her dtr are constantly reminding her.      PAIN:  Are you having pain?  No  PATIENT GOALS: To be able to walk without any assistive device and to feel comfortable and legs not get weak.    OBJECTIVE:    LE MMT:   MMT Right 05/29/2021 Left 05/29/2021  Hip flexion 4-/5 4-/5  Hip extension 3+/5 3+/5  Hip abduction 3+/5 3+/5  Hip adduction 4/5 4/5  Hip internal rotation 3+/5 3+/5  Hip external rotation 3+/5 3+/5  Knee flexion 4/5 4/5  Knee extension 4/5 4/5  Ankle dorsiflexion 4/5 4/5  Ankle plantarflexion 4/5 4/5  Ankle inversion 4/5 4/5  Ankle eversion 4/5 4/5   (Blank rows = not tested) LE MMT:   MMT Right 07/23/2021 Left 07/23/2021  Hip flexion 4/5 4/5  Hip extension 4-/5 4-/5  Hip abduction 4/5 4/5  Hip adduction 4+/5 4+/5  Hip internal rotation 4/5 4/5  Hip external rotation 3+/5 4-/5  Knee flexion 4/5 4/5  Knee extension 4/5 4+/5  Ankle dorsiflexion 4/5 4/5  Ankle plantarflexion 4/5 4/5  Ankle inversion 4/5 4/5  Ankle eversion 4/5 4/5   (Blank rows = not tested)     FUNCTIONAL TESTS:   07/09/2021: 5 times sit to stand: 15.5 sec without using UE Timed up and go (TUG): 17.08 sec with RW  07/09/2021: 5 times sit to stand: 17.7 sec without using UE Timed up and go (TUG): 20.4 sec with RW  05/29/2021: 5 times sit to stand: 26.52 Timed up and go (TUG): 25.75   GAIT:  07/09/2021: 3 min walk:  376 ft with RW  07/09/2021: 3 min walk:  359 ft with RW  06/05/2021: 3 min walk:  259 ft with RW Comments: short shuffle step, LE's externally rotated, heavy reliance on RW       TODAY'S TREATMENT:  08/01/2021: Nustep L5 x8 min, PT present to discuss status and goals Standing at bar: toe raises, hip abd, and hip extension bilaterally x 20 each Lateral band walks x 5 laps each direction (blue loop) Seated clam with blue loop x 20 Alt LE toe tap to 4" step with HHA 2x10 with cuing to keep opposite hand in her pocket to avoid holding onto barre Seated LAQ and march x 20 each with 6 lb Sit to/from stand with UE pushing up from thighs and mod  cuing for leaning forward and increasing power production through LE  07/30/2021: Nustep L5 x8 min, PT present to discuss status and goals Standing at bar: toe raises, hip abd, and hip extension bilaterally x 20 each Lateral band walks x 5 laps (blue loop) Seated clam with blue loop x 20 Seated LAQ and march x 20 each with 6 lb Sit to stand 2 x 5 without UE use Standing tap up on cone with min assist from PT alternating each foot.  (Patient could not do this without holding onto bar initially, then when placed away from bar, she was able) x 20 Step up 6" step x 10 each  LE   07/23/2021: Nustep L5 x8 min, PT present to discuss status and goals Re-assessment for Recert Gait training during 3 min walk test, reminders to keep equal step length and step through gait.  Min vc's required for this.      PATIENT EDUCATION:  Education details: Gait training: educated on heel to toe progression and proper step length along with upright posture.  Also reminded patient about appropriate assistive device and need to use regularly.   Educated on proper/safe sit to stand.   Person educated: Patient Education method: Explanation, Demonstration, and Verbal cues Education comprehension: verbalized understanding, returned demonstration, and verbal cues required     HOME EXERCISE PROGRAM: Patient to work on proper step length with heel strike along with sit to stand x 5 every 2 hours.    Access Code: [email or web address] URL: [email or web address] Date: 06/05/2021 Prepared by: Juel Burrow  Exercises - Seated Long Arc Quad  - 1 x daily - 7 x weekly - 2 sets - 10 reps - Seated March  - 1 x daily - 7 x weekly - 2 sets - 10 reps - Sit to Stand  - 1 x daily - 7 x weekly - 2 sets - 10 reps - Standing March with Counter Support  - 1 x daily - 7 x weekly - 2 sets - 10 reps - Standing Hip Abduction with Counter Support  - 1 x daily - 7 x weekly - 2 sets - 10 reps - Standing Hip Extension with Counter  Support  - 1 x daily - 7 x weekly - 2 sets - 10 reps - Standing Hip Flexion with Counter Support  - 1 x daily - 7 x weekly - 2 sets - 10 reps   ASSESSMENT:   CLINICAL IMPRESSION: Ms W[place] presents to skilled rehabilitation with reports of fatigue, but denies pain.  Pt requires UE support of pushing up through thighs with sit to/from stand.  Additionally, pt requires HHA with alt LE toe tap to step.  Pt continues to require skilled PT to progress her balance and stability in standing and with dynamic balance to progress towards a decreased risk of falling.   OBJECTIVE IMPAIRMENTS Abnormal gait, decreased activity tolerance, decreased balance, decreased endurance, decreased knowledge of condition, decreased knowledge of use of DME, decreased mobility, difficulty walking, decreased strength, decreased safety awareness, impaired flexibility, and postural dysfunction.    ACTIVITY LIMITATIONS cleaning, community activity, meal prep, laundry, yard work, and shopping.    PERSONAL FACTORS Age, Fitness, Past/current experiences, Time since onset of injury/illness/exacerbation, and 3+ comorbidities: htn, diabetes and obesity  are also affecting patient's functional outcome.      REHAB POTENTIAL: Fair due to multiple co-morbidities.   CLINICAL DECISION MAKING: Evolving/moderate complexity   EVALUATION COMPLEXITY: Moderate     GOALS: Goals reviewed with patient? Yes   SHORT TERM GOALS: Target date: 06/26/21   Patient will be independent with initial HEP  Baseline: Goal status: GOAL MET   2.  Patient to demonstrate consistent use of proper assistive device for reducing fall risk.  Baseline:  Goal status: GOAL MET   3.  Patient to be able to do 5 times sit to stand properly  Baseline:  Goal status: GOAL MET   4.  Patient to be able to walk 100 feet without rest break Baseline:  Goal status: GOAL MET     LONG TERM GOALS: Target date: 07/24/2021 (new target date 08/30/21)   Patient to be  independent with advanced HEP  Baseline:  Goal status: ONGOING   2.  Patient to obtain proper assistive device and be able to use safely (rollator recommended) Baseline:  Goal status: ONGOING   3.  Patient to be able to walk 300 feet with appropriate/safest assistive device without rest Baseline:  Goal status: MET   4.  Patient functional tests to improve as follows: 5 times sit to stand to 22 sec, TUG to 24 sec.  Baseline:  Goal status: goal met with RW   5.  Patient to report no falls during episode of PT Baseline:  Goal status: ONGOING     PLAN: PT FREQUENCY: 2x/week   PT DURATION: 4 more weeks   PLANNED INTERVENTIONS: Therapeutic exercises, Therapeutic activity, Neuromuscular re-education, Balance training, Gait training, Patient/Family education, Joint mobilization, Stair training, DME instructions, Aquatic Therapy, Dry Needling, Electrical stimulation, Cryotherapy, Moist heat, Taping, Vasopneumatic device, Ultrasound, Ionotophoresis 14m/ml Dexamethasone, and Manual therapy   PLAN FOR NEXT SESSION:  Extend PT for 4 more weeks.  2 times per week.  Progress ambulation without RW, LE strengthening, balance training, NuSTep   SJuel Burrow PT 08/01/21 10:21 AM    [facility] [address] [place] [address]Phone # [phone] [phone]

## 2021-08-05 ENCOUNTER — Ambulatory Visit: Payer: Medicare Other | Admitting: Rehabilitative and Restorative Service Providers"

## 2021-08-05 ENCOUNTER — Encounter: Payer: Self-pay | Admitting: Rehabilitative and Restorative Service Providers"

## 2021-08-05 DIAGNOSIS — Z9181 History of falling: Secondary | ICD-10-CM | POA: Diagnosis not present

## 2021-08-05 DIAGNOSIS — R262 Difficulty in walking, not elsewhere classified: Secondary | ICD-10-CM | POA: Diagnosis not present

## 2021-08-05 DIAGNOSIS — R2681 Unsteadiness on feet: Secondary | ICD-10-CM | POA: Diagnosis not present

## 2021-08-05 DIAGNOSIS — M6281 Muscle weakness (generalized): Secondary | ICD-10-CM | POA: Diagnosis not present

## 2021-08-05 NOTE — Therapy (Signed)
OUTPATIENT PHYSICAL THERAPY RECERT NOTE   Patient Name: [name] Church MRN: [id number] DOB:1934-07-29, 87 y.o., female Today's Date: 08/05/2021  PCP: Jonathon Jordan, MD REFERRING PROVIDER: Jonathon Jordan, MD  Progress Note Reporting Period 05/29/2021 to 07/09/2021  See note below for Objective Data and Assessment of Progress/Goals.      END OF SESSION:   PT End of Session - 08/05/21 1044     Visit Number 17    Date for PT Re-Evaluation 08/23/21    Authorization Type [employer] Medicare    Progress Note Due on Visit 20    PT Start Time 1044    PT Stop Time 1125    PT Time Calculation (min) 41 min    Activity Tolerance Patient limited by fatigue    Behavior During Therapy [facility] for tasks assessed/performed             Past Medical History:  Diagnosis Date   Diabetes mellitus without complication ([place])    Edema    Elevated serum immunoglobulin free light chain level 11/29/2013   Macrocytosis without anemia 11/29/2013   Nonsustained ventricular tachycardia (HCC)    115 episodes of NSVT on mointor up to 6 beats in a row with maximum HR of 197bpm on heart monitor 06/2020   Osteoporosis    PAT (paroxysmal atrial tachycardia) (HCC)    nonsustained atrial tachycardia up to 23 seconds at a time on heart monitor 06/2020   Thyroid disease    Unspecified deficiency anemia 11/29/2013   Past Surgical History:  Procedure Laterality Date   APPENDECTOMY     EYE SURGERY Left 03/2013   eye bleeds internally   EYE SURGERY Right 03/2013   FRACTURE SURGERY Left 2012   leg with metal fixation   Patient Active Problem List   Diagnosis Date Noted   History of falling 05/29/2021   Diabetic peripheral neuropathy associated with type 1 diabetes mellitus ([place]) 03/20/2021   Irregular heart beat 08/21/2020   PVC's (premature ventricular contractions) 07/04/2020   Allergic rhinitis 02/21/2020   Body mass index (BMI) 34.0-34.9, adult 02/21/2020   Callosity 02/21/2020   Diabetic renal disease  ([place]) 02/21/2020   Disorder of the skin and subcutaneous tissue, unspecified 02/21/2020   Essential hypertension 02/21/2020   Impairment of balance 02/21/2020   Macrocytosis 02/21/2020   Onychomycosis 02/21/2020   Other intervertebral disc degeneration, lumbar region 02/21/2020   Pathological fracture 02/21/2020   Vitamin D deficiency 02/21/2020   Background diabetic retinopathy ([place]) 02/21/2020   Senile purpura ([place]) 02/21/2020   Stasis dermatitis 02/21/2020   CKD (chronic kidney disease) stage 3, GFR 30-59 ml/min (HCC) 10/08/2016   Osteoporosis, postmenopausal 04/08/2016   Primary hypothyroidism 04/08/2016   Type 1 diabetes mellitus with hyperglycemia ([place]) 04/08/2016   Type 1 diabetes mellitus with mild nonproliferative retinopathy of both eyes without macular edema (HCC) 04/08/2016   Elevated serum immunoglobulin free light chain level 11/29/2013   Macrocytosis without anemia 11/29/2013   Unspecified deficiency anemia 11/29/2013   Edema     REFERRING DIAG: R26.89 (ICD-10-CM) - Other abnormalities of gait and mobility   THERAPY DIAG:  History of falling  Difficulty walking  Unsteadiness on feet  Muscle weakness (generalized)  PERTINENT HISTORY: L femur Fx 11 years, then L fractured foot 10 years ago  PRECAUTIONS: Fall risk  SUBJECTIVE: Patient reports that she is having some issues with her allergies today.    PAIN:  Are you having pain? No  PATIENT GOALS: To be able to walk without any assistive  device and to feel comfortable and legs not get weak.    OBJECTIVE:    LE MMT:   MMT Right 05/29/2021 Left 05/29/2021  Hip flexion 4-/5 4-/5  Hip extension 3+/5 3+/5  Hip abduction 3+/5 3+/5  Hip adduction 4/5 4/5  Hip internal rotation 3+/5 3+/5  Hip external rotation 3+/5 3+/5  Knee flexion 4/5 4/5  Knee extension 4/5 4/5  Ankle dorsiflexion 4/5 4/5  Ankle plantarflexion 4/5 4/5  Ankle inversion 4/5 4/5  Ankle eversion 4/5 4/5   (Blank rows = not tested) LE  MMT:   MMT Right 07/23/2021 Left 07/23/2021  Hip flexion 4/5 4/5  Hip extension 4-/5 4-/5  Hip abduction 4/5 4/5  Hip adduction 4+/5 4+/5  Hip internal rotation 4/5 4/5  Hip external rotation 3+/5 4-/5  Knee flexion 4/5 4/5  Knee extension 4/5 4+/5  Ankle dorsiflexion 4/5 4/5  Ankle plantarflexion 4/5 4/5  Ankle inversion 4/5 4/5  Ankle eversion 4/5 4/5   (Blank rows = not tested)     FUNCTIONAL TESTS:   07/09/2021: 5 times sit to stand: 15.5 sec without using UE Timed up and go (TUG): 17.08 sec with RW  07/09/2021: 5 times sit to stand: 17.7 sec without using UE Timed up and go (TUG): 20.4 sec with RW  05/29/2021: 5 times sit to stand: 26.52 Timed up and go (TUG): 25.75   GAIT:  07/09/2021: 3 min walk:  376 ft with RW  07/09/2021: 3 min walk:  359 ft with RW  06/05/2021: 3 min walk:  259 ft with RW Comments: short shuffle step, LE's externally rotated, heavy reliance on RW       TODAY'S TREATMENT:  08/05/2021: Nustep L5 x8 min (633 steps), PT present to discuss status and goals Seated clam with green tband x 20 Seated LAQ and march x 20 each with 6 lb Sit to/from stand from PT mat 2x5 reps without UE use Standing at bar: toe raises, hip flexion, hip abd, and hip extension bilaterally x 20 each Alt LE toe tap to 4" step with HHA 2x10 with cuing to keep opposite hand in her pocket to avoid holding onto barre  08/01/2021: Nustep L5 x8 min, PT present to discuss status and goals Standing at bar: toe raises, hip abd, and hip extension bilaterally x 20 each Lateral band walks x 5 laps each direction (blue loop) Seated clam with blue loop x 20 Alt LE toe tap to 4" step with HHA 2x10 with cuing to keep opposite hand in her pocket to avoid holding onto barre Seated LAQ and march x 20 each with 6 lb Sit to/from stand with UE pushing up from thighs and mod cuing for leaning forward and increasing power production through LE  07/30/2021: Nustep L5 x8 min, PT present to discuss  status and goals Standing at bar: toe raises, hip abd, and hip extension bilaterally x 20 each Lateral band walks x 5 laps (blue loop) Seated clam with blue loop x 20 Seated LAQ and march x 20 each with 6 lb Sit to stand 2 x 5 without UE use Standing tap up on cone with min assist from PT alternating each foot.  (Patient could not do this without holding onto bar initially, then when placed away from bar, she was able) x 20 Step up 6" step x 10 each LE      PATIENT EDUCATION:  Education details: Gait training: educated on heel to toe progression and proper step length along with  upright posture.  Also reminded patient about appropriate assistive device and need to use regularly.   Educated on proper/safe sit to stand.   Person educated: Patient Education method: Explanation, Demonstration, and Verbal cues Education comprehension: verbalized understanding, returned demonstration, and verbal cues required     HOME EXERCISE PROGRAM: Patient to work on proper step length with heel strike along with sit to stand x 5 every 2 hours.    Access Code: [email or web address] URL: [email or web address] Date: 06/05/2021 Prepared by: Juel Burrow  Exercises - Seated Long Arc Quad  - 1 x daily - 7 x weekly - 2 sets - 10 reps - Seated March  - 1 x daily - 7 x weekly - 2 sets - 10 reps - Sit to Stand  - 1 x daily - 7 x weekly - 2 sets - 10 reps - Standing March with Counter Support  - 1 x daily - 7 x weekly - 2 sets - 10 reps - Standing Hip Abduction with Counter Support  - 1 x daily - 7 x weekly - 2 sets - 10 reps - Standing Hip Extension with Counter Support  - 1 x daily - 7 x weekly - 2 sets - 10 reps - Standing Hip Flexion with Counter Support  - 1 x daily - 7 x weekly - 2 sets - 10 reps   ASSESSMENT:   CLINICAL IMPRESSION: Ms Chevere presents to skilled rehabilitation with reports of feeling some dyspnea due to allergies, but denies pain.  Pt requires seated recovery periods between different  standing exercises and use of barre for balance.  Pt able to complete sit to/from stand from PT mat without use of UE today.  Pt continues to require skilled PT to progress towards goal related activities and decreased risk of falling.   OBJECTIVE IMPAIRMENTS Abnormal gait, decreased activity tolerance, decreased balance, decreased endurance, decreased knowledge of condition, decreased knowledge of use of DME, decreased mobility, difficulty walking, decreased strength, decreased safety awareness, impaired flexibility, and postural dysfunction.    ACTIVITY LIMITATIONS cleaning, community activity, meal prep, laundry, yard work, and shopping.    PERSONAL FACTORS Age, Fitness, Past/current experiences, Time since onset of injury/illness/exacerbation, and 3+ comorbidities: htn, diabetes and obesity  are also affecting patient's functional outcome.      REHAB POTENTIAL: Fair due to multiple co-morbidities.   CLINICAL DECISION MAKING: Evolving/moderate complexity   EVALUATION COMPLEXITY: Moderate     GOALS: Goals reviewed with patient? Yes   SHORT TERM GOALS: Target date: 06/26/21   Patient will be independent with initial HEP  Baseline: Goal status: GOAL MET   2.  Patient to demonstrate consistent use of proper assistive device for reducing fall risk.  Baseline:  Goal status: GOAL MET   3.  Patient to be able to do 5 times sit to stand properly  Baseline:  Goal status: GOAL MET   4.  Patient to be able to walk 100 feet without rest break Baseline:  Goal status: GOAL MET     LONG TERM GOALS: Target date: 07/24/2021 (new target date 08/30/21)   Patient to be independent with advanced HEP  Baseline:  Goal status: ONGOING   2.  Patient to obtain proper assistive device and be able to use safely (rollator recommended) Baseline:  Goal status: ONGOING   3.  Patient to be able to walk 300 feet with appropriate/safest assistive device without rest Baseline:  Goal status: MET   4.   Patient functional tests to improve as follows:  5 times sit to stand to 22 sec, TUG to 24 sec.  Baseline:  Goal status: goal met with RW   5.  Patient to report no falls during episode of PT Baseline:  Goal status: ONGOING     PLAN: PT FREQUENCY: 2x/week   PT DURATION: 4 more weeks   PLANNED INTERVENTIONS: Therapeutic exercises, Therapeutic activity, Neuromuscular re-education, Balance training, Gait training, Patient/Family education, Joint mobilization, Stair training, DME instructions, Aquatic Therapy, Dry Needling, Electrical stimulation, Cryotherapy, Moist heat, Taping, Vasopneumatic device, Ultrasound, Ionotophoresis 43m/ml Dexamethasone, and Manual therapy   PLAN FOR NEXT SESSION:  Progress ambulation without RW, LE strengthening, balance training, NuSTep   SJuel Burrow PT 08/05/21 11:32 AM    BB[facility] [address] [place]riarGNew [place] [address]Phone # [phone] [phone]

## 2021-08-07 ENCOUNTER — Ambulatory Visit: Payer: Medicare Other

## 2021-08-07 DIAGNOSIS — R2681 Unsteadiness on feet: Secondary | ICD-10-CM

## 2021-08-07 DIAGNOSIS — Z9181 History of falling: Secondary | ICD-10-CM | POA: Diagnosis not present

## 2021-08-07 DIAGNOSIS — M6281 Muscle weakness (generalized): Secondary | ICD-10-CM | POA: Diagnosis not present

## 2021-08-07 DIAGNOSIS — R262 Difficulty in walking, not elsewhere classified: Secondary | ICD-10-CM | POA: Diagnosis not present

## 2021-08-07 NOTE — Therapy (Signed)
OUTPATIENT PHYSICAL THERAPY RECERT NOTE   Patient Name: [name] Church MRN: [id number] DOB:[birth year], 87 y.o., female Today's Date: 08/07/2021  PCP: Jonathon Jordan, MD REFERRING PROVIDER: Jonathon Jordan, MD  Progress Note Reporting Period 05/29/2021 to 07/09/2021  See note below for Objective Data and Assessment of Progress/Goals.      END OF SESSION:   PT End of Session - 08/07/21 1152     Visit Number 18    Date for PT Re-Evaluation 08/23/21    Authorization Type [employer] Medicare    Progress Note Due on Visit 20    PT Start Time 1145    PT Stop Time 1230    PT Time Calculation (min) 45 min    Activity Tolerance Patient limited by fatigue    Behavior During Therapy [facility] for tasks assessed/performed             Past Medical History:  Diagnosis Date   Diabetes mellitus without complication ([place])    Edema    Elevated serum immunoglobulin free light chain level 11/29/2013   Macrocytosis without anemia 11/29/2013   Nonsustained ventricular tachycardia (HCC)    115 episodes of NSVT on mointor up to 6 beats in a row with maximum HR of 197bpm on heart monitor 06/2020   Osteoporosis    PAT (paroxysmal atrial tachycardia) (HCC)    nonsustained atrial tachycardia up to 23 seconds at a time on heart monitor 06/2020   Thyroid disease    Unspecified deficiency anemia 11/29/2013   Past Surgical History:  Procedure Laterality Date   APPENDECTOMY     EYE SURGERY Left 03/2013   eye bleeds internally   EYE SURGERY Right 03/2013   FRACTURE SURGERY Left 2012   leg with metal fixation   Patient Active Problem List   Diagnosis Date Noted   History of falling 05/29/2021   Diabetic peripheral neuropathy associated with type 1 diabetes mellitus ([place]) 03/20/2021   Irregular heart beat 08/21/2020   PVC's (premature ventricular contractions) 07/04/2020   Allergic rhinitis 02/21/2020   Body mass index (BMI) 34.0-34.9, adult 02/21/2020   Callosity 02/21/2020   Diabetic renal disease  ([place]) 02/21/2020   Disorder of the skin and subcutaneous tissue, unspecified 02/21/2020   Essential hypertension 02/21/2020   Impairment of balance 02/21/2020   Macrocytosis 02/21/2020   Onychomycosis 02/21/2020   Other intervertebral disc degeneration, lumbar region 02/21/2020   Pathological fracture 02/21/2020   Vitamin D deficiency 02/21/2020   Background diabetic retinopathy ([place]) 02/21/2020   Senile purpura ([place]) 02/21/2020   Stasis dermatitis 02/21/2020   CKD (chronic kidney disease) stage 3, GFR 30-59 ml/min (HCC) 10/08/2016   Osteoporosis, postmenopausal 04/08/2016   Primary hypothyroidism 04/08/2016   Type 1 diabetes mellitus with hyperglycemia ([place]) 04/08/2016   Type 1 diabetes mellitus with mild nonproliferative retinopathy of both eyes without macular edema (HCC) 04/08/2016   Elevated serum immunoglobulin free light chain level 11/29/2013   Macrocytosis without anemia 11/29/2013   Unspecified deficiency anemia 11/29/2013   Edema     REFERRING DIAG: R26.89 (ICD-10-CM) - Other abnormalities of gait and mobility   THERAPY DIAG:  History of falling  Difficulty walking  Unsteadiness on feet  Muscle weakness (generalized)  PERTINENT HISTORY: L femur Fx 11 years, then L fractured foot 10 years ago  PRECAUTIONS: Fall risk  SUBJECTIVE: Patient reports that she is having some issues with her allergies today.    PAIN:  Are you having pain? No  PATIENT GOALS: To be able to walk without any assistive  device and to feel comfortable and legs not get weak.    OBJECTIVE:    LE MMT:   MMT Right 05/29/2021 Left 05/29/2021  Hip flexion 4-/5 4-/5  Hip extension 3+/5 3+/5  Hip abduction 3+/5 3+/5  Hip adduction 4/5 4/5  Hip internal rotation 3+/5 3+/5  Hip external rotation 3+/5 3+/5  Knee flexion 4/5 4/5  Knee extension 4/5 4/5  Ankle dorsiflexion 4/5 4/5  Ankle plantarflexion 4/5 4/5  Ankle inversion 4/5 4/5  Ankle eversion 4/5 4/5   (Blank rows = not tested) LE  MMT:   MMT Right 07/23/2021 Left 07/23/2021  Hip flexion 4/5 4/5  Hip extension 4-/5 4-/5  Hip abduction 4/5 4/5  Hip adduction 4+/5 4+/5  Hip internal rotation 4/5 4/5  Hip external rotation 3+/5 4-/5  Knee flexion 4/5 4/5  Knee extension 4/5 4+/5  Ankle dorsiflexion 4/5 4/5  Ankle plantarflexion 4/5 4/5  Ankle inversion 4/5 4/5  Ankle eversion 4/5 4/5   (Blank rows = not tested)     FUNCTIONAL TESTS:   07/09/2021: 5 times sit to stand: 15.5 sec without using UE Timed up and go (TUG): 17.08 sec with RW  07/09/2021: 5 times sit to stand: 17.7 sec without using UE Timed up and go (TUG): 20.4 sec with RW  05/29/2021: 5 times sit to stand: 26.52 Timed up and go (TUG): 25.75   GAIT:  07/09/2021: 3 min walk:  376 ft with RW  07/09/2021: 3 min walk:  359 ft with RW  06/05/2021: 3 min walk:  259 ft with RW Comments: short shuffle step, LE's externally rotated, heavy reliance on RW       TODAY'S TREATMENT:  08/07/2021: Nustep L5 x8 min (633 steps), PT present to discuss status and goals Sit to stand from chair with 5 lb kb Static Standing balance x 1 min Static Standing balance with head turns x 1 min Static standing balance with heel to toe weight shifting x 1 min Step up with right x 10 with bar on right 6 in Step up with left x 10 with bar on right 6 in Step up with right x 10 with bar on left 6 in Stair training x 1 with bilateral UE support Lengthy discussion about fear and open loop vs. Closed loop systems and their effect on balance and fear of falling.    08/05/2021: Nustep L5 x8 min (633 steps), PT present to discuss status and goals Seated clam with green tband x 20 Seated LAQ and march x 20 each with 6 lb Sit to/from stand from PT mat 2x5 reps without UE use Standing at bar: toe raises, hip flexion, hip abd, and hip extension bilaterally x 20 each Alt LE toe tap to 4" step with HHA 2x10 with cuing to keep opposite hand in her pocket to avoid holding onto  barre  08/01/2021: Nustep L5 x8 min, PT present to discuss status and goals Standing at bar: toe raises, hip abd, and hip extension bilaterally x 20 each Lateral band walks x 5 laps each direction (blue loop) Seated clam with blue loop x 20 Alt LE toe tap to 4" step with HHA 2x10 with cuing to keep opposite hand in her pocket to avoid holding onto barre Seated LAQ and march x 20 each with 6 lb Sit to/from stand with UE pushing up from thighs and mod cuing for leaning forward and increasing power production through LE  07/30/2021: Nustep L5 x8 min, PT present to discuss status  and goals Standing at bar: toe raises, hip abd, and hip extension bilaterally x 20 each Lateral band walks x 5 laps (blue loop) Seated clam with blue loop x 20 Seated LAQ and march x 20 each with 6 lb Sit to stand 2 x 5 without UE use Standing tap up on cone with min assist from PT alternating each foot.  (Patient could not do this without holding onto bar initially, then when placed away from bar, she was able) x 20 Step up 6" step x 10 each LE      PATIENT EDUCATION:  Education details: Gait training: educated on heel to toe progression and proper step length along with upright posture.  Also reminded patient about appropriate assistive device and need to use regularly.   Educated on proper/safe sit to stand.   Person educated: Patient Education method: Explanation, Demonstration, and Verbal cues Education comprehension: verbalized understanding, returned demonstration, and verbal cues required     HOME EXERCISE PROGRAM: Patient to work on proper step length with heel strike along with sit to stand x 5 every 2 hours.    Access Code: [email or web address] URL: [email or web address] Date: 06/05/2021 Prepared by: Juel Burrow  Exercises - Seated Long Arc Quad  - 1 x daily - 7 x weekly - 2 sets - 10 reps - Seated March  - 1 x daily - 7 x weekly - 2 sets - 10 reps - Sit to Stand  - 1 x daily - 7 x weekly - 2  sets - 10 reps - Standing March with Counter Support  - 1 x daily - 7 x weekly - 2 sets - 10 reps - Standing Hip Abduction with Counter Support  - 1 x daily - 7 x weekly - 2 sets - 10 reps - Standing Hip Extension with Counter Support  - 1 x daily - 7 x weekly - 2 sets - 10 reps - Standing Hip Flexion with Counter Support  - 1 x daily - 7 x weekly - 2 sets - 10 reps   ASSESSMENT:   CLINICAL IMPRESSION: Ms Winfree has made decent progress.  She continues to be very fearful of falling despite good gains in strength.    Pt continues to require skilled PT to progress towards goal related activities and decreased risk of falling.   OBJECTIVE IMPAIRMENTS Abnormal gait, decreased activity tolerance, decreased balance, decreased endurance, decreased knowledge of condition, decreased knowledge of use of DME, decreased mobility, difficulty walking, decreased strength, decreased safety awareness, impaired flexibility, and postural dysfunction.    ACTIVITY LIMITATIONS cleaning, community activity, meal prep, laundry, yard work, and shopping.    PERSONAL FACTORS Age, Fitness, Past/current experiences, Time since onset of injury/illness/exacerbation, and 3+ comorbidities: htn, diabetes and obesity  are also affecting patient's functional outcome.      REHAB POTENTIAL: Fair due to multiple co-morbidities.   CLINICAL DECISION MAKING: Evolving/moderate complexity   EVALUATION COMPLEXITY: Moderate     GOALS: Goals reviewed with patient? Yes   SHORT TERM GOALS: Target date: 06/26/21   Patient will be independent with initial HEP  Baseline: Goal status: GOAL MET   2.  Patient to demonstrate consistent use of proper assistive device for reducing fall risk.  Baseline:  Goal status: GOAL MET   3.  Patient to be able to do 5 times sit to stand properly  Baseline:  Goal status: GOAL MET   4.  Patient to be able to walk 100 feet without rest break Baseline:  Goal status:  GOAL MET     LONG TERM  GOALS: Target date: 07/24/2021 (new target date 08/30/21)   Patient to be independent with advanced HEP  Baseline:  Goal status: ONGOING   2.  Patient to obtain proper assistive device and be able to use safely (rollator recommended) Baseline:  Goal status: ONGOING   3.  Patient to be able to walk 300 feet with appropriate/safest assistive device without rest Baseline:  Goal status: MET   4.  Patient functional tests to improve as follows: 5 times sit to stand to 22 sec, TUG to 24 sec.  Baseline:  Goal status: goal met with RW   5.  Patient to report no falls during episode of PT Baseline:  Goal status: ONGOING     PLAN: PT FREQUENCY: 2x/week   PT DURATION: 4 more weeks   PLANNED INTERVENTIONS: Therapeutic exercises, Therapeutic activity, Neuromuscular re-education, Balance training, Gait training, Patient/Family education, Joint mobilization, Stair training, DME instructions, Aquatic Therapy, Dry Needling, Electrical stimulation, Cryotherapy, Moist heat, Taping, Vasopneumatic device, Ultrasound, Ionotophoresis 83m/ml Dexamethasone, and Manual therapy   PLAN FOR NEXT SESSION:  Progress ambulation without RW, LE strengthening, balance training, NuSTep   Arshiya Jakes B. Coty Student, PT 08/07/21 12:37 PM    BUnion[place] SL[place] LomasGPur[place] [address]Phone # [phone] [phone]

## 2021-08-14 ENCOUNTER — Ambulatory Visit: Payer: Medicare Other

## 2021-08-14 DIAGNOSIS — Z9181 History of falling: Secondary | ICD-10-CM

## 2021-08-14 DIAGNOSIS — R262 Difficulty in walking, not elsewhere classified: Secondary | ICD-10-CM | POA: Diagnosis not present

## 2021-08-14 DIAGNOSIS — R2681 Unsteadiness on feet: Secondary | ICD-10-CM | POA: Diagnosis not present

## 2021-08-14 DIAGNOSIS — M6281 Muscle weakness (generalized): Secondary | ICD-10-CM | POA: Diagnosis not present

## 2021-08-14 NOTE — Therapy (Signed)
OUTPATIENT PHYSICAL THERAPY RECERT NOTE   Patient Name: SHANIQUIA BRAFFORD MRN: [id number] DOB:12-Apr-1934, 87 y.o., female Today's Date: 08/14/2021  PCP: Jonathon Jordan, MD REFERRING PROVIDER: Jonathon Jordan, MD  Progress Note Reporting Period 05/29/2021 to 07/09/2021  See note below for Objective Data and Assessment of Progress/Goals.      END OF SESSION:   PT End of Session - 08/14/21 1144     Visit Number 19    Date for PT Re-Evaluation 08/23/21    Authorization Type [employer] Medicare    Progress Note Due on Visit 20    PT Start Time 1143    PT Stop Time 1230    PT Time Calculation (min) 47 min    Activity Tolerance Patient limited by fatigue    Behavior During Therapy [facility] for tasks assessed/performed             Past Medical History:  Diagnosis Date   Diabetes mellitus without complication ([place])    Edema    Elevated serum immunoglobulin free light chain level 11/29/2013   Macrocytosis without anemia 11/29/2013   Nonsustained ventricular tachycardia (HCC)    115 episodes of NSVT on mointor up to 6 beats in a row with maximum HR of 197bpm on heart monitor 06/2020   Osteoporosis    PAT (paroxysmal atrial tachycardia) (HCC)    nonsustained atrial tachycardia up to 23 seconds at a time on heart monitor 06/2020   Thyroid disease    Unspecified deficiency anemia 11/29/2013   Past Surgical History:  Procedure Laterality Date   APPENDECTOMY     EYE SURGERY Left 03/2013   eye bleeds internally   EYE SURGERY Right 03/2013   FRACTURE SURGERY Left 2012   leg with metal fixation   Patient Active Problem List   Diagnosis Date Noted   History of falling 05/29/2021   Diabetic peripheral neuropathy associated with type 1 diabetes mellitus ([place]) 03/20/2021   Irregular heart beat 08/21/2020   PVC's (premature ventricular contractions) 07/04/2020   Allergic rhinitis 02/21/2020   Body mass index (BMI) 34.0-34.9, adult 02/21/2020   Callosity 02/21/2020   Diabetic renal disease  ([place]) 02/21/2020   Disorder of the skin and subcutaneous tissue, unspecified 02/21/2020   Essential hypertension 02/21/2020   Impairment of balance 02/21/2020   Macrocytosis 02/21/2020   Onychomycosis 02/21/2020   Other intervertebral disc degeneration, lumbar region 02/21/2020   Pathological fracture 02/21/2020   Vitamin D deficiency 02/21/2020   Background diabetic retinopathy ([place]) 02/21/2020   Senile purpura ([place]) 02/21/2020   Stasis dermatitis 02/21/2020   CKD (chronic kidney disease) stage 3, GFR 30-59 ml/min (HCC) 10/08/2016   Osteoporosis, postmenopausal 04/08/2016   Primary hypothyroidism 04/08/2016   Type 1 diabetes mellitus with hyperglycemia ([place]) 04/08/2016   Type 1 diabetes mellitus with mild nonproliferative retinopathy of both eyes without macular edema (HCC) 04/08/2016   Elevated serum immunoglobulin free light chain level 11/29/2013   Macrocytosis without anemia 11/29/2013   Unspecified deficiency anemia 11/29/2013   Edema     REFERRING DIAG: R26.89 (ICD-10-CM) - Other abnormalities of gait and mobility   THERAPY DIAG:  History of falling  Difficulty walking  Unsteadiness on feet  Muscle weakness (generalized)  PERTINENT HISTORY: L femur Fx 11 years, then L fractured foot 10 years ago  PRECAUTIONS: Fall risk  SUBJECTIVE: Patient reports that she is having some issues with her allergies today.    PAIN:  Are you having pain? No  PATIENT GOALS: To be able to walk without any assistive  device and to feel comfortable and legs not get weak.    OBJECTIVE:    LE MMT:   MMT Right 05/29/2021 Left 05/29/2021  Hip flexion 4-/5 4-/5  Hip extension 3+/5 3+/5  Hip abduction 3+/5 3+/5  Hip adduction 4/5 4/5  Hip internal rotation 3+/5 3+/5  Hip external rotation 3+/5 3+/5  Knee flexion 4/5 4/5  Knee extension 4/5 4/5  Ankle dorsiflexion 4/5 4/5  Ankle plantarflexion 4/5 4/5  Ankle inversion 4/5 4/5  Ankle eversion 4/5 4/5   (Blank rows = not tested) LE  MMT:   MMT Right 07/23/2021 Left 07/23/2021  Hip flexion 4/5 4/5  Hip extension 4-/5 4-/5  Hip abduction 4/5 4/5  Hip adduction 4+/5 4+/5  Hip internal rotation 4/5 4/5  Hip external rotation 3+/5 4-/5  Knee flexion 4/5 4/5  Knee extension 4/5 4+/5  Ankle dorsiflexion 4/5 4/5  Ankle plantarflexion 4/5 4/5  Ankle inversion 4/5 4/5  Ankle eversion 4/5 4/5   (Blank rows = not tested)     FUNCTIONAL TESTS:   07/23/2021: 5 times sit to stand: 15.5 sec without using UE Timed up and go (TUG): 17.08 sec with RW  07/09/2021: 5 times sit to stand: 17.7 sec without using UE Timed up and go (TUG): 20.4 sec with RW  05/29/2021: 5 times sit to stand: 26.52 Timed up and go (TUG): 25.75   GAIT:  07/23/2021: 3 min walk:  376 ft with RW  07/09/2021: 3 min walk:  359 ft with RW  06/05/2021: 3 min walk:  259 ft with RW Comments: short shuffle step, LE's externally rotated, heavy reliance on RW       TODAY'S TREATMENT:  08/14/2021: Nustep L5 x8 min, PT present to discuss status and goals Sit to stand from chair with 5 lb kb 2 x 5 Static Standing balance x 30 sec on balance pad without UE support with cga Marching on balance pad with right UE support x 20 Lunge to balance pad x 10 each LE with right UE support (attempted without UE support but patient only able to do 2 reps) Rocker board x 2 min Static Standing balance with head turns x 1 min Static standing balance with heel to toe weight shifting x 1 min Step up on balance pad with opposite knee lift with right UE support x 10 each LE Stride outs for hip disassociation x 10 each LE with right UE support Gait training x 100' with rw focusing on consistent equal step length     08/07/2021: Nustep L5 x8 min (633 steps), PT present to discuss status and goals Sit to stand from chair with 5 lb kb Static Standing balance x 1 min Static Standing balance with head turns x 1 min Static standing balance with heel to toe weight shifting x 1  min Step up with right x 10 with bar on right 6 in Step up with left x 10 with bar on right 6 in Step up with right x 10 with bar on left 6 in Stair training x 1 with bilateral UE support Lengthy discussion about fear and open loop vs. Closed loop systems and their effect on balance and fear of falling.    08/05/2021: Nustep L5 x8 min (633 steps), PT present to discuss status and goals Seated clam with green tband x 20 Seated LAQ and march x 20 each with 6 lb Sit to/from stand from PT mat 2x5 reps without UE use Standing at bar: toe raises, hip  flexion, hip abd, and hip extension bilaterally x 20 each Alt LE toe tap to 4" step with HHA 2x10 with cuing to keep opposite hand in her pocket to avoid holding onto barre  08/01/2021: Nustep L5 x8 min, PT present to discuss status and goals Standing at bar: toe raises, hip abd, and hip extension bilaterally x 20 each Lateral band walks x 5 laps each direction (blue loop) Seated clam with blue loop x 20 Alt LE toe tap to 4" step with HHA 2x10 with cuing to keep opposite hand in her pocket to avoid holding onto barre Seated LAQ and march x 20 each with 6 lb Sit to/from stand with UE pushing up from thighs and mod cuing for leaning forward and increasing power production through LE      PATIENT EDUCATION:  Education details: Gait training: educated on heel to toe progression and proper step length along with upright posture.  Also reminded patient about appropriate assistive device and need to use regularly.   Educated on proper/safe sit to stand.   Person educated: Patient Education method: Explanation, Demonstration, and Verbal cues Education comprehension: verbalized understanding, returned demonstration, and verbal cues required     HOME EXERCISE PROGRAM: Patient to work on proper step length with heel strike along with sit to stand x 5 every 2 hours.    Access Code: [email or web address] URL: [email or web address] Date:  06/05/2021 Prepared by: Juel Burrow  Exercises - Seated Long Arc Quad  - 1 x daily - 7 x weekly - 2 sets - 10 reps - Seated March  - 1 x daily - 7 x weekly - 2 sets - 10 reps - Sit to Stand  - 1 x daily - 7 x weekly - 2 sets - 10 reps - Standing March with Counter Support  - 1 x daily - 7 x weekly - 2 sets - 10 reps - Standing Hip Abduction with Counter Support  - 1 x daily - 7 x weekly - 2 sets - 10 reps - Standing Hip Extension with Counter Support  - 1 x daily - 7 x weekly - 2 sets - 10 reps - Standing Hip Flexion with Counter Support  - 1 x daily - 7 x weekly - 2 sets - 10 reps   ASSESSMENT:   CLINICAL IMPRESSION: Ms Etzkorn did quite well today.  She admits her eyes are feeling better.  She is now doing sit to stand with 5 lb kb without UE support with ease.  She occasionally needs vc's for fwd weight shift.   She continues to be very fearful of falling despite good gains in strength.    Pt continues to require skilled PT to progress towards goal related activities and decreased risk of falling.   OBJECTIVE IMPAIRMENTS Abnormal gait, decreased activity tolerance, decreased balance, decreased endurance, decreased knowledge of condition, decreased knowledge of use of DME, decreased mobility, difficulty walking, decreased strength, decreased safety awareness, impaired flexibility, and postural dysfunction.    ACTIVITY LIMITATIONS cleaning, community activity, meal prep, laundry, yard work, and shopping.    PERSONAL FACTORS Age, Fitness, Past/current experiences, Time since onset of injury/illness/exacerbation, and 3+ comorbidities: htn, diabetes and obesity  are also affecting patient's functional outcome.      REHAB POTENTIAL: Fair due to multiple co-morbidities.   CLINICAL DECISION MAKING: Evolving/moderate complexity   EVALUATION COMPLEXITY: Moderate     GOALS: Goals reviewed with patient? Yes   SHORT TERM GOALS: Target date: 06/26/21   Patient will be independent  with initial  HEP  Baseline: Goal status: GOAL MET   2.  Patient to demonstrate consistent use of proper assistive device for reducing fall risk.  Baseline:  Goal status: GOAL MET   3.  Patient to be able to do 5 times sit to stand properly  Baseline:  Goal status: GOAL MET   4.  Patient to be able to walk 100 feet without rest break Baseline:  Goal status: GOAL MET     LONG TERM GOALS: Target date: 07/24/2021 (new target date 08/30/21)   Patient to be independent with advanced HEP  Baseline:  Goal status: ONGOING   2.  Patient to obtain proper assistive device and be able to use safely (rollator recommended) Baseline:  Goal status: ONGOING   3.  Patient to be able to walk 300 feet with appropriate/safest assistive device without rest Baseline:  Goal status: MET   4.  Patient functional tests to improve as follows: 5 times sit to stand to 22 sec, TUG to 24 sec.  Baseline:  Goal status: goal met with RW   5.  Patient to report no falls during episode of PT Baseline:  Goal status: ONGOING     PLAN: PT FREQUENCY: 2x/week   PT DURATION: 4 more weeks   PLANNED INTERVENTIONS: Therapeutic exercises, Therapeutic activity, Neuromuscular re-education, Balance training, Gait training, Patient/Family education, Joint mobilization, Stair training, DME instructions, Aquatic Therapy, Dry Needling, Electrical stimulation, Cryotherapy, Moist heat, Taping, Vasopneumatic device, Ultrasound, Ionotophoresis 97m/ml Dexamethasone, and Manual therapy   PLAN FOR NEXT SESSION:  Progress ambulation without RW, LE strengthening, balance training, NuSTep   Tait Balistreri B. Carle Fenech, PT 08/14/21 12:38 PM    [address] [place] [address]Phone # [phone] [phone]

## 2021-08-20 ENCOUNTER — Ambulatory Visit: Payer: Medicare Other

## 2021-08-20 DIAGNOSIS — R262 Difficulty in walking, not elsewhere classified: Secondary | ICD-10-CM

## 2021-08-20 DIAGNOSIS — Z9181 History of falling: Secondary | ICD-10-CM | POA: Diagnosis not present

## 2021-08-20 DIAGNOSIS — M6281 Muscle weakness (generalized): Secondary | ICD-10-CM

## 2021-08-20 DIAGNOSIS — R2681 Unsteadiness on feet: Secondary | ICD-10-CM | POA: Diagnosis not present

## 2021-08-20 NOTE — Therapy (Signed)
OUTPATIENT PHYSICAL THERAPY DISCHARGE NOTE   Patient Name: [name] Church MRN: [id number] DOB:March 06, 1934, 87 y.o., female Today's Date: 08/20/2021  PCP: Jonathon Jordan, MD REFERRING PROVIDER: Jonathon Jordan, MD      END OF SESSION:   PT End of Session - 08/20/21 1201     Visit Number 20    Date for PT Re-Evaluation 08/23/21    Authorization Type UHC Medicare    Progress Note Due on Visit 20    PT Start Time 1150    PT Stop Time 1230    PT Time Calculation (min) 40 min    Activity Tolerance Patient limited by fatigue    Behavior During Therapy [facility] for tasks assessed/performed             Past Medical History:  Diagnosis Date   Diabetes mellitus without complication ([place])    Edema    Elevated serum immunoglobulin free light chain level 11/29/2013   Macrocytosis without anemia 11/29/2013   Nonsustained ventricular tachycardia (HCC)    115 episodes of NSVT on mointor up to 6 beats in a row with maximum HR of 197bpm on heart monitor 06/2020   Osteoporosis    PAT (paroxysmal atrial tachycardia) (HCC)    nonsustained atrial tachycardia up to 23 seconds at a time on heart monitor 06/2020   Thyroid disease    Unspecified deficiency anemia 11/29/2013   Past Surgical History:  Procedure Laterality Date   APPENDECTOMY     EYE SURGERY Left 03/2013   eye bleeds internally   EYE SURGERY Right 03/2013   FRACTURE SURGERY Left 2012   leg with metal fixation   Patient Active Problem List   Diagnosis Date Noted   History of falling 05/29/2021   Diabetic peripheral neuropathy associated with type 1 diabetes mellitus ([place]) 03/20/2021   Irregular heart beat 08/21/2020   PVC's (premature ventricular contractions) 07/04/2020   Allergic rhinitis 02/21/2020   Body mass index (BMI) 34.0-34.9, adult 02/21/2020   Callosity 02/21/2020   Diabetic renal disease ([place]) 02/21/2020   Disorder of the skin and subcutaneous tissue, unspecified 02/21/2020   Essential hypertension 02/21/2020    Impairment of balance 02/21/2020   Macrocytosis 02/21/2020   Onychomycosis 02/21/2020   Other intervertebral disc degeneration, lumbar region 02/21/2020   Pathological fracture 02/21/2020   Vitamin D deficiency 02/21/2020   Background diabetic retinopathy ([place]) 02/21/2020   Senile purpura ([place]) 02/21/2020   Stasis dermatitis 02/21/2020   CKD (chronic kidney disease) stage 3, GFR 30-59 ml/min (HCC) 10/08/2016   Osteoporosis, postmenopausal 04/08/2016   Primary hypothyroidism 04/08/2016   Type 1 diabetes mellitus with hyperglycemia ([place]) 04/08/2016   Type 1 diabetes mellitus with mild nonproliferative retinopathy of both eyes without macular edema (HCC) 04/08/2016   Elevated serum immunoglobulin free light chain level 11/29/2013   Macrocytosis without anemia 11/29/2013   Unspecified deficiency anemia 11/29/2013   Edema     REFERRING DIAG: R26.89 (ICD-10-CM) - Other abnormalities of gait and mobility   THERAPY DIAG:  History of falling  Difficulty walking  Unsteadiness on feet  Muscle weakness (generalized)  PERTINENT HISTORY: L femur Fx 11 years, then L fractured foot 10 years ago  PRECAUTIONS: Fall risk  SUBJECTIVE: Patient reports that she is having some issues with her allergies today.    PAIN:  Are you having pain? No  PATIENT GOALS: To be able to walk without any assistive device and to feel comfortable and legs not get weak.    OBJECTIVE:    LE MMT:  MMT Right 05/29/2021 Right 08/20/21 Left 05/29/2021 Left 08/20/21  Hip flexion 4-/5 4/5 4-/5 4/5  Hip extension 3+/5 4/5 3+/5 4/5  Hip abduction 3+/5 4/5 3+/5 4/5  Hip adduction 4/5 4+/5 4/5 4+/5  Hip internal rotation 3+/5 4/5 3+/5 4-/5  Hip external rotation 3+/5 4-/5 3+/5 4-/5  Knee flexion 4/5 4/5 4/5 4/5  Knee extension 4/5 4+/5 4/5 4/5  Ankle dorsiflexion 4/5 4/5 4/5 4/5  Ankle plantarflexion 4/5 4+/5 4/5 4+/5  Ankle inversion 4/5 4+/5 4/5 4+/5  Ankle eversion 4/5 4+/5 4/5 4+/5   (Blank rows = not  tested) LE MMT:   MMT Right 07/23/2021 Left 07/23/2021  Hip flexion 4/5 4/5  Hip extension 4-/5 4-/5  Hip abduction 4/5 4/5  Hip adduction 4+/5 4+/5  Hip internal rotation 4/5 4/5  Hip external rotation 3+/5 4-/5  Knee flexion 4/5 4/5  Knee extension 4/5 4+/5  Ankle dorsiflexion 4/5 4/5  Ankle plantarflexion 4/5 4/5  Ankle inversion 4/5 4/5  Ankle eversion 4/5 4/5   (Blank rows = not tested)     FUNCTIONAL TESTS:   08/20/2021: 5 times sit to stand: 26.79 sec without using UE Timed up and go (TUG): 20.18 sec with RW 07/23/2021: 5 times sit to stand: 15.5 sec without using UE Timed up and go (TUG): 17.08 sec with RW  07/09/2021: 5 times sit to stand: 17.7 sec without using UE Timed up and go (TUG): 20.4 sec with RW  05/29/2021: 5 times sit to stand: 26.52 with use of hands Timed up and go (TUG): 25.75   GAIT:  08/20/21: 3 min walk test: 343 ft with RW  07/23/2021: 3 min walk:  376 ft with RW  07/09/2021: 3 min walk:  359 ft with RW  06/05/2021: 3 min walk:  259 ft with RW Comments: short shuffle step, LE's externally rotated, heavy reliance on RW       TODAY'S TREATMENT:  08/20/2021: Recumbant bike x 5  min, (NuStep unavailable - pt could not do full revolution on bike)  PT present to discuss status and goals Re-assessment visit:  see objective data above Discussed progress with patient.  Indicated that she has reached a plateau but would benefit from continuing at a local fitness facility.   Gait training during 3 min walk test: Repeated emphasis on stride length and heel strike.   Discussed DC plan and appropriate places to make use of silver sneakers including [employer] or [employer].   Reviewed HEP.      08/14/2021: Nustep L5 x8 min, PT present to discuss status and goals Sit to stand from chair with 5 lb kb 2 x 5 Static Standing balance x 30 sec on balance pad without UE support with cga Marching on balance pad with right UE support x 20 Lunge to balance pad x 10  each LE with right UE support (attempted without UE support but patient only able to do 2 reps) Rocker board x 2 min Static Standing balance with head turns x 1 min Static standing balance with heel to toe weight shifting x 1 min Step up on balance pad with opposite knee lift with right UE support x 10 each LE Stride outs for hip disassociation x 10 each LE with right UE support Gait training x 100' with rw focusing on consistent equal step length     08/07/2021: Nustep L5 x8 min (633 steps), PT present to discuss status and goals Sit to stand from chair with 5 lb kb Static Standing  balance x 1 min Static Standing balance with head turns x 1 min Static standing balance with heel to toe weight shifting x 1 min Step up with right x 10 with bar on right 6 in Step up with left x 10 with bar on right 6 in Step up with right x 10 with bar on left 6 in Stair training x 1 with bilateral UE support Lengthy discussion about fear and open loop vs. Closed loop systems and their effect on balance and fear of falling.      PATIENT EDUCATION:  Education details: Gait training: educated on heel to toe progression and proper step length along with upright posture.  Also reminded patient about appropriate assistive device and need to use regularly.   Educated on proper/safe sit to stand.   Person educated: Patient Education method: Explanation, Demonstration, and Verbal cues Education comprehension: verbalized understanding, returned demonstration, and verbal cues required     HOME EXERCISE PROGRAM: Patient to work on proper step length with heel strike along with sit to stand x 5 every 2 hours.    Access Code: [email or web address] URL: [email or web address] Date: 06/05/2021 Prepared by: Juel Burrow  Exercises - Seated Long Arc Quad  - 1 x daily - 7 x weekly - 2 sets - 10 reps - Seated March  - 1 x daily - 7 x weekly - 2 sets - 10 reps - Sit to Stand  - 1 x daily - 7 x weekly - 2 sets - 10  reps - Standing March with Counter Support  - 1 x daily - 7 x weekly - 2 sets - 10 reps - Standing Hip Abduction with Counter Support  - 1 x daily - 7 x weekly - 2 sets - 10 reps - Standing Hip Extension with Counter Support  - 1 x daily - 7 x weekly - 2 sets - 10 reps - Standing Hip Flexion with Counter Support  - 1 x daily - 7 x weekly - 2 sets - 10 reps   ASSESSMENT:   CLINICAL IMPRESSION: Ms Armenteros has met a plateau with current therapy.  We have been working toward goals for approx 3 months with some success but she has met a plateau.  Compliance with HEP is fair.  She continues to score in a high fall risk category but has made significant improvements in strength.  She is able to do sit to stand and all other transfers with increased ease.  She reports increased confidence with walking and leaving home.  She would benefit from continuing a guided regular exercise regimen at her local fitness facility.     OBJECTIVE IMPAIRMENTS Abnormal gait, decreased activity tolerance, decreased balance, decreased endurance, decreased knowledge of condition, decreased knowledge of use of DME, decreased mobility, difficulty walking, decreased strength, decreased safety awareness, impaired flexibility, and postural dysfunction.    ACTIVITY LIMITATIONS cleaning, community activity, meal prep, laundry, yard work, and shopping.    PERSONAL FACTORS Age, Fitness, Past/current experiences, Time since onset of injury/illness/exacerbation, and 3+ comorbidities: htn, diabetes and obesity  are also affecting patient's functional outcome.      REHAB POTENTIAL: Fair due to multiple co-morbidities.   CLINICAL DECISION MAKING: Evolving/moderate complexity   EVALUATION COMPLEXITY: Moderate     GOALS: Goals reviewed with patient? Yes   SHORT TERM GOALS: Target date: 06/26/21   Patient will be independent with initial HEP  Baseline: Goal status: GOAL MET   2.  Patient to demonstrate consistent use of proper  assistive  device for reducing fall risk.  Baseline:  Goal status: GOAL MET   3.  Patient to be able to do 5 times sit to stand properly  Baseline:  Goal status: GOAL MET   4.  Patient to be able to walk 100 feet without rest break Baseline:  Goal status: GOAL MET     LONG TERM GOALS: Target date: 07/24/2021 (new target date 08/30/21)   Patient to be independent with advanced HEP  Baseline:  Goal status: MET   2.  Patient to obtain proper assistive device and be able to use safely (rollator recommended) Baseline:  Goal status: MET   3.  Patient to be able to walk 300 feet with appropriate/safest assistive device without rest Baseline:  Goal status: MET   4.  Patient functional tests to improve as follows: 5 times sit to stand to 22 sec, TUG to 24 sec.  Baseline:  Goal status: goal met with RW   5.  Patient to report no falls during episode of PT Baseline:  Goal status: MET but still in high fall risk category     PLAN: PT FREQUENCY: 2x/week   PT DURATION: 4 more weeks   PLANNED INTERVENTIONS: Therapeutic exercises, Therapeutic activity, Neuromuscular re-education, Balance training, Gait training, Patient/Family education, Joint mobilization, Stair training, DME instructions, Aquatic Therapy, Dry Needling, Electrical stimulation, Cryotherapy, Moist heat, Taping, Vasopneumatic device, Ultrasound, Ionotophoresis 43m/ml Dexamethasone, and Manual therapy   PLAN FOR NEXT SESSION:  We will DC at this time due to reaching plateau.  She understands that it is important to continue her exercise program at a local fitness facility or guided program to avoid decline.   PHYSICAL THERAPY DISCHARGE SUMMARY  Visits from Start of Care: 20  Current functional level related to goals / functional outcomes: See above   Remaining deficits: See above   Education / Equipment: See above   Patient agrees to discharge. Patient goals were partially met. Patient is being discharged due to  maximized rehab potential.    JAnderson MaltaB. Korin Setzler, PT 08/20/21 8:24 PM  [address] [place]oks[place] [address]Phone # [phone] [phone]

## 2021-08-21 DIAGNOSIS — H401133 Primary open-angle glaucoma, bilateral, severe stage: Secondary | ICD-10-CM | POA: Diagnosis not present

## 2021-08-27 ENCOUNTER — Other Ambulatory Visit: Payer: Self-pay | Admitting: Internal Medicine

## 2021-08-27 ENCOUNTER — Ambulatory Visit: Payer: Medicare Other

## 2021-08-29 ENCOUNTER — Ambulatory Visit: Payer: Medicare Other

## 2021-08-30 ENCOUNTER — Ambulatory Visit: Payer: Medicare Other | Admitting: Podiatry

## 2021-08-30 ENCOUNTER — Encounter: Payer: Self-pay | Admitting: Podiatry

## 2021-08-30 DIAGNOSIS — B351 Tinea unguium: Secondary | ICD-10-CM

## 2021-08-30 DIAGNOSIS — E1042 Type 1 diabetes mellitus with diabetic polyneuropathy: Secondary | ICD-10-CM

## 2021-08-30 DIAGNOSIS — M79674 Pain in right toe(s): Secondary | ICD-10-CM

## 2021-08-30 DIAGNOSIS — M79675 Pain in left toe(s): Secondary | ICD-10-CM

## 2021-08-30 NOTE — Progress Notes (Signed)
  Subjective:  Patient ID: [name] Church, female    DOB: 09-05-1934,  MRN: [id number]  [name] Church presents to clinic today for at risk foot care with history of diabetic neuropathy and painful thick toenails that are difficult to trim. Pain interferes with ambulation. Aggravating factors include wearing enclosed shoe gear. Pain is relieved with periodic professional debridement.  Patient states blood glucose was 154 mg/dl today.  Last A1c was 8.1%.  New problem(s): None.   PCP is Jonathon Jordan, MD , and last visit was May 06, 2021.  Allergies  Allergen Reactions   Mexiletine     Other reaction(s): Other (See Comments) Off balance    Sulfa Antibiotics    Sulfamethoxazole Rash    Review of Systems: Negative except as noted in the HPI.  Objective: No changes noted in today's physical examination. There were no vitals filed for this visit.  [name] Church is a pleasant 87 y.o. female in NAD. AAO X 3.  Vascular Examination: CFT <3 seconds b/l LE. Palpable DP pulse(s) b/l LE. Palpable PT pulse(s) b/l LE. Pedal hair sparse. No pain with calf compression RLE. Lower extremity skin temperature gradient within normal limits.  Dermatological Examination: Pedal integument with normal turgor, texture and tone BLE. No open wounds b/l LE. No interdigital macerations noted b/l LE. Toenails 1-5 bilaterally elongated, discolored, dystrophic, thickened, and crumbly with subungual debris and tenderness to dorsal palpation.  Neurological Examination: Protective sensation intact 5/5 intact bilaterally with 10g monofilament b/l. Vibratory sensation intact b/l.  Musculoskeletal Examination: Muscle strength 5/5 to all lower extremity muscle groups bilaterally. No pain, crepitus or joint limitation noted with ROM bilateral LE. Pes planus deformity noted bilateral LE.  Assessment/Plan: 1. Pain due to onychomycosis of toenails of both feet   2. Diabetic peripheral neuropathy associated with  type 1 diabetes mellitus ([place])     -Patient was evaluated and treated. All patient's and/or POA's questions/concerns answered on today's visit. -Patient to continue soft, supportive shoe gear daily. -Toenails 1-5 b/l were debrided in length and girth with sterile nail nippers and dremel without iatrogenic bleeding.  -Patient/POA to call should there be question/concern in the interim.   Return in about 10 weeks (around 11/08/2021).  Marzetta Board, DPM

## 2021-09-27 DIAGNOSIS — E039 Hypothyroidism, unspecified: Secondary | ICD-10-CM | POA: Diagnosis not present

## 2021-09-27 DIAGNOSIS — E10649 Type 1 diabetes mellitus with hypoglycemia without coma: Secondary | ICD-10-CM | POA: Diagnosis not present

## 2021-09-27 DIAGNOSIS — E1069 Type 1 diabetes mellitus with other specified complication: Secondary | ICD-10-CM | POA: Diagnosis not present

## 2021-09-27 DIAGNOSIS — E1022 Type 1 diabetes mellitus with diabetic chronic kidney disease: Secondary | ICD-10-CM | POA: Diagnosis not present

## 2021-09-27 DIAGNOSIS — M810 Age-related osteoporosis without current pathological fracture: Secondary | ICD-10-CM | POA: Diagnosis not present

## 2021-09-27 DIAGNOSIS — E785 Hyperlipidemia, unspecified: Secondary | ICD-10-CM | POA: Diagnosis not present

## 2021-09-27 DIAGNOSIS — R2689 Other abnormalities of gait and mobility: Secondary | ICD-10-CM | POA: Diagnosis not present

## 2021-09-27 DIAGNOSIS — N1832 Chronic kidney disease, stage 3b: Secondary | ICD-10-CM | POA: Diagnosis not present

## 2021-09-27 DIAGNOSIS — E1042 Type 1 diabetes mellitus with diabetic polyneuropathy: Secondary | ICD-10-CM | POA: Diagnosis not present

## 2021-09-27 DIAGNOSIS — E1065 Type 1 diabetes mellitus with hyperglycemia: Secondary | ICD-10-CM | POA: Diagnosis not present

## 2021-10-01 DIAGNOSIS — E1022 Type 1 diabetes mellitus with diabetic chronic kidney disease: Secondary | ICD-10-CM | POA: Diagnosis not present

## 2021-10-01 DIAGNOSIS — E039 Hypothyroidism, unspecified: Secondary | ICD-10-CM | POA: Diagnosis not present

## 2021-10-01 DIAGNOSIS — R2689 Other abnormalities of gait and mobility: Secondary | ICD-10-CM | POA: Diagnosis not present

## 2021-10-01 DIAGNOSIS — N1832 Chronic kidney disease, stage 3b: Secondary | ICD-10-CM | POA: Diagnosis not present

## 2021-10-30 DIAGNOSIS — E1065 Type 1 diabetes mellitus with hyperglycemia: Secondary | ICD-10-CM | POA: Diagnosis not present

## 2021-11-25 ENCOUNTER — Encounter: Payer: Self-pay | Admitting: Podiatry

## 2021-11-25 ENCOUNTER — Ambulatory Visit: Payer: Medicare Other | Admitting: Podiatry

## 2021-11-25 DIAGNOSIS — B351 Tinea unguium: Secondary | ICD-10-CM | POA: Diagnosis not present

## 2021-11-25 DIAGNOSIS — M79675 Pain in left toe(s): Secondary | ICD-10-CM

## 2021-11-25 DIAGNOSIS — M79674 Pain in right toe(s): Secondary | ICD-10-CM

## 2021-11-25 DIAGNOSIS — E1042 Type 1 diabetes mellitus with diabetic polyneuropathy: Secondary | ICD-10-CM

## 2021-11-25 NOTE — Progress Notes (Signed)
  Subjective:  Patient ID: [name] Church, female    DOB: [birth year],  MRN: [id number]  [name] Church presents to clinic today for at risk foot care with history of diabetic neuropathy and painful elongated mycotic toenails 1-5 bilaterally which are tender when wearing enclosed shoe gear. Pain is relieved with periodic professional debridement.  Patient states blood glucose was 101 mg/dl today. Last known  HgA1c was under 7%.    New problem(s): None.   PCP is Jonathon Jordan, MD , and last visit was  May 06, 2021.  Allergies  Allergen Reactions   Mexiletine     Other reaction(s): Other (See Comments) Off balance    Sulfa Antibiotics    Sulfamethoxazole Rash    Review of Systems: Negative except as noted in the HPI.  Objective: No changes noted in today's physical examination. [name] Church is a pleasant 87 y.o. female in NAD. AAO x 3.  Vascular Examination: CFT <3 seconds b/l LE. Palpable DP pulse(s) b/l LE. Palpable PT pulse(s) b/l LE. Pedal hair sparse. No pain with calf compression RLE. Lower extremity skin temperature gradient within normal limits.  Dermatological Examination: Pedal integument with normal turgor, texture and tone BLE. No open wounds b/l LE. No interdigital macerations noted b/l LE. Toenails 1-5 bilaterally elongated, discolored, dystrophic, thickened, and crumbly with subungual debris and tenderness to dorsal palpation.  Neurological Examination: Protective sensation intact 5/5 intact bilaterally with 10g monofilament b/l. Vibratory sensation intact b/l.  Musculoskeletal Examination: Muscle strength 5/5 to all lower extremity muscle groups bilaterally. No pain, crepitus or joint limitation noted with ROM bilateral LE. Pes planus deformity noted bilateral LE.  Assessment/Plan: 1. Pain due to onychomycosis of toenails of both feet   2. Diabetic peripheral neuropathy associated with type 1 diabetes mellitus ([place])     -Consent given for treatment as  described below: -Examined patient. -Continue foot and shoe inspections daily. Monitor blood glucose per PCP/Endocrinologist's recommendations. -Mycotic toenails 1-5 bilaterally were debrided in length and girth with sterile nail nippers and dremel without incident. -Patient/POA to call should there be question/concern in the interim.   Return in about 3 months (around 02/24/2022).  Marzetta Board, DPM

## 2021-12-06 ENCOUNTER — Other Ambulatory Visit: Payer: Self-pay

## 2021-12-06 MED ORDER — METOPROLOL SUCCINATE ER 25 MG PO TB24: 25.0000 mg | ORAL_TABLET | Freq: Every day | ORAL | 1 refills | Status: AC

## 2021-12-12 DIAGNOSIS — T8522XA Displacement of intraocular lens, initial encounter: Secondary | ICD-10-CM | POA: Diagnosis not present

## 2021-12-12 DIAGNOSIS — E113553 Type 2 diabetes mellitus with stable proliferative diabetic retinopathy, bilateral: Secondary | ICD-10-CM | POA: Diagnosis not present

## 2021-12-12 DIAGNOSIS — Z79899 Other long term (current) drug therapy: Secondary | ICD-10-CM | POA: Diagnosis not present

## 2021-12-12 DIAGNOSIS — H401133 Primary open-angle glaucoma, bilateral, severe stage: Secondary | ICD-10-CM | POA: Diagnosis not present

## 2021-12-12 DIAGNOSIS — Z794 Long term (current) use of insulin: Secondary | ICD-10-CM | POA: Diagnosis not present

## 2021-12-25 ENCOUNTER — Emergency Department (HOSPITAL_COMMUNITY): Payer: Medicare Other

## 2021-12-25 ENCOUNTER — Observation Stay (HOSPITAL_COMMUNITY)
Admission: EM | Admit: 2021-12-25 | Discharge: 2021-12-27 | Disposition: A | Discharge status: Home / Self Care | Payer: Medicare Other | Attending: Internal Medicine | Admitting: Internal Medicine

## 2021-12-25 ENCOUNTER — Encounter (HOSPITAL_COMMUNITY): Payer: Self-pay | Admitting: Emergency Medicine

## 2021-12-25 ENCOUNTER — Other Ambulatory Visit: Payer: Self-pay

## 2021-12-25 DIAGNOSIS — E860 Dehydration: Secondary | ICD-10-CM | POA: Insufficient documentation

## 2021-12-25 DIAGNOSIS — Z7982 Long term (current) use of aspirin: Secondary | ICD-10-CM | POA: Diagnosis not present

## 2021-12-25 DIAGNOSIS — R0602 Shortness of breath: Secondary | ICD-10-CM | POA: Diagnosis not present

## 2021-12-25 DIAGNOSIS — J9811 Atelectasis: Secondary | ICD-10-CM | POA: Diagnosis not present

## 2021-12-25 DIAGNOSIS — I959 Hypotension, unspecified: Secondary | ICD-10-CM | POA: Diagnosis not present

## 2021-12-25 DIAGNOSIS — R2681 Unsteadiness on feet: Secondary | ICD-10-CM | POA: Diagnosis not present

## 2021-12-25 DIAGNOSIS — Z794 Long term (current) use of insulin: Secondary | ICD-10-CM | POA: Insufficient documentation

## 2021-12-25 DIAGNOSIS — Z79899 Other long term (current) drug therapy: Secondary | ICD-10-CM | POA: Insufficient documentation

## 2021-12-25 DIAGNOSIS — Z743 Need for continuous supervision: Secondary | ICD-10-CM | POA: Diagnosis not present

## 2021-12-25 DIAGNOSIS — E039 Hypothyroidism, unspecified: Secondary | ICD-10-CM | POA: Diagnosis present

## 2021-12-25 DIAGNOSIS — M6281 Muscle weakness (generalized): Secondary | ICD-10-CM | POA: Diagnosis not present

## 2021-12-25 DIAGNOSIS — I129 Hypertensive chronic kidney disease with stage 1 through stage 4 chronic kidney disease, or unspecified chronic kidney disease: Secondary | ICD-10-CM | POA: Diagnosis not present

## 2021-12-25 DIAGNOSIS — R791 Abnormal coagulation profile: Secondary | ICD-10-CM | POA: Diagnosis not present

## 2021-12-25 DIAGNOSIS — E1022 Type 1 diabetes mellitus with diabetic chronic kidney disease: Secondary | ICD-10-CM | POA: Insufficient documentation

## 2021-12-25 DIAGNOSIS — E109 Type 1 diabetes mellitus without complications: Secondary | ICD-10-CM

## 2021-12-25 DIAGNOSIS — N1832 Chronic kidney disease, stage 3b: Secondary | ICD-10-CM | POA: Insufficient documentation

## 2021-12-25 DIAGNOSIS — D539 Nutritional anemia, unspecified: Secondary | ICD-10-CM

## 2021-12-25 DIAGNOSIS — R404 Transient alteration of awareness: Secondary | ICD-10-CM | POA: Diagnosis not present

## 2021-12-25 DIAGNOSIS — R55 Syncope and collapse: Secondary | ICD-10-CM | POA: Diagnosis not present

## 2021-12-25 DIAGNOSIS — R262 Difficulty in walking, not elsewhere classified: Secondary | ICD-10-CM | POA: Insufficient documentation

## 2021-12-25 DIAGNOSIS — N183 Chronic kidney disease, stage 3 unspecified: Secondary | ICD-10-CM | POA: Diagnosis present

## 2021-12-25 DIAGNOSIS — H409 Unspecified glaucoma: Secondary | ICD-10-CM

## 2021-12-25 DIAGNOSIS — R42 Dizziness and giddiness: Secondary | ICD-10-CM | POA: Diagnosis not present

## 2021-12-25 DIAGNOSIS — I10 Essential (primary) hypertension: Secondary | ICD-10-CM | POA: Diagnosis present

## 2021-12-25 DIAGNOSIS — I499 Cardiac arrhythmia, unspecified: Secondary | ICD-10-CM | POA: Diagnosis not present

## 2021-12-25 DIAGNOSIS — R6889 Other general symptoms and signs: Secondary | ICD-10-CM | POA: Diagnosis not present

## 2021-12-25 DIAGNOSIS — D649 Anemia, unspecified: Secondary | ICD-10-CM | POA: Diagnosis not present

## 2021-12-25 DIAGNOSIS — E1065 Type 1 diabetes mellitus with hyperglycemia: Secondary | ICD-10-CM | POA: Insufficient documentation

## 2021-12-25 DIAGNOSIS — I951 Orthostatic hypotension: Secondary | ICD-10-CM

## 2021-12-25 DIAGNOSIS — R7989 Other specified abnormal findings of blood chemistry: Secondary | ICD-10-CM

## 2021-12-25 DIAGNOSIS — R531 Weakness: Secondary | ICD-10-CM | POA: Diagnosis not present

## 2021-12-25 DIAGNOSIS — I493 Ventricular premature depolarization: Secondary | ICD-10-CM | POA: Diagnosis present

## 2021-12-25 LAB — BASIC METABOLIC PANEL
Anion gap: 7 (ref 5–15)
BUN: 40 mg/dL — ABNORMAL HIGH (ref 8–23)
CO2: 25 mmol/L (ref 22–32)
Calcium: 8.7 mg/dL — ABNORMAL LOW (ref 8.9–10.3)
Chloride: 108 mmol/L (ref 98–111)
Creatinine, Ser: 1.48 mg/dL — ABNORMAL HIGH (ref 0.44–1.00)
GFR, Estimated: 35 mL/min — ABNORMAL LOW (ref >60)
Glucose, Bld: 152 mg/dL — ABNORMAL HIGH (ref 70–99)
Potassium: 4.1 mmol/L (ref 3.5–5.1)
Sodium: 140 mmol/L (ref 135–145)

## 2021-12-25 LAB — CBG MONITORING, ED
Glucose-Capillary: 153 mg/dL — ABNORMAL HIGH (ref 70–99)
Glucose-Capillary: 366 mg/dL — ABNORMAL HIGH (ref 70–99)
Glucose-Capillary: 398 mg/dL — ABNORMAL HIGH (ref 70–99)

## 2021-12-25 LAB — URINALYSIS, ROUTINE W REFLEX MICROSCOPIC
Bacteria, UA: NONE SEEN
Bilirubin Urine: NEGATIVE
Glucose, UA: >=500 mg/dL — AB
Hgb urine dipstick: NEGATIVE
Ketones, ur: NEGATIVE mg/dL
Nitrite: NEGATIVE
Protein, ur: NEGATIVE mg/dL
Specific Gravity, Urine: 1.029 (ref 1.005–1.030)
pH: 6 (ref 5.0–8.0)

## 2021-12-25 LAB — HEPATIC FUNCTION PANEL
ALT: 22 U/L (ref 0–44)
AST: 26 U/L (ref 15–41)
Albumin: 3.1 g/dL — ABNORMAL LOW (ref 3.5–5.0)
Alkaline Phosphatase: 42 U/L (ref 38–126)
Bilirubin, Direct: 0.2 mg/dL (ref 0.0–0.2)
Indirect Bilirubin: 0.4 mg/dL (ref 0.3–0.9)
Total Bilirubin: 0.6 mg/dL (ref 0.3–1.2)
Total Protein: 6.2 g/dL — ABNORMAL LOW (ref 6.5–8.1)

## 2021-12-25 LAB — CBC
HCT: 34.2 % — ABNORMAL LOW (ref 36.0–46.0)
Hemoglobin: 11.1 g/dL — ABNORMAL LOW (ref 12.0–15.0)
MCH: 33.3 pg (ref 26.0–34.0)
MCHC: 32.5 g/dL (ref 30.0–36.0)
MCV: 102.7 fL — ABNORMAL HIGH (ref 80.0–100.0)
Platelets: 216 10*3/uL (ref 150–400)
RBC: 3.33 MIL/uL — ABNORMAL LOW (ref 3.87–5.11)
RDW: 13 % (ref 11.5–15.5)
WBC: 7.6 10*3/uL (ref 4.0–10.5)
nRBC: 0 % (ref 0.0–0.2)

## 2021-12-25 LAB — D-DIMER, QUANTITATIVE: D-Dimer, Quant: 1.23 ug/mL-FEU — ABNORMAL HIGH (ref 0.00–0.50)

## 2021-12-25 LAB — TROPONIN I (HIGH SENSITIVITY)
Troponin I (High Sensitivity): 7 ng/L (ref <18)
Troponin I (High Sensitivity): 7 ng/L (ref <18)

## 2021-12-25 LAB — MAGNESIUM: Magnesium: 1.9 mg/dL (ref 1.7–2.4)

## 2021-12-25 LAB — BRAIN NATRIURETIC PEPTIDE: B Natriuretic Peptide: 207.8 pg/mL — ABNORMAL HIGH (ref 0.0–100.0)

## 2021-12-25 MED ORDER — NETARSUDIL-LATANOPROST 0.02-0.005 % OP SOLN: 1.0000 [drp] | Freq: Every day | OPHTHALMIC | Status: DC

## 2021-12-25 MED ORDER — SODIUM CHLORIDE 0.9 % IV SOLN: INTRAVENOUS | Status: DC

## 2021-12-25 MED ORDER — ACETAMINOPHEN 500 MG PO TABS: 500.0000 mg | ORAL_TABLET | Freq: Four times a day (QID) | ORAL | Status: DC | PRN

## 2021-12-25 MED ORDER — BRIMONIDINE TARTRATE 0.2 % OP SOLN
1.0000 [drp] | Freq: Two times a day (BID) | OPHTHALMIC | Status: DC
  Administered 2021-12-27: 1 [drp] via OPHTHALMIC
  Filled 2021-12-25: qty 5

## 2021-12-25 MED ORDER — LEVOTHYROXINE SODIUM 25 MCG PO TABS
125.0000 ug | ORAL_TABLET | Freq: Every day | ORAL | Status: DC
  Administered 2021-12-26 – 2021-12-27 (×2): 125 ug via ORAL
  Filled 2021-12-25 (×2): qty 1

## 2021-12-25 MED ORDER — ASPIRIN 325 MG PO TBEC
325.0000 mg | DELAYED_RELEASE_TABLET | Freq: Every day | ORAL | Status: DC
  Administered 2021-12-26 – 2021-12-27 (×2): 325 mg via ORAL
  Filled 2021-12-25 (×2): qty 1

## 2021-12-25 MED ORDER — SODIUM CHLORIDE 0.9% FLUSH
3.0000 mL | Freq: Two times a day (BID) | INTRAVENOUS | Status: DC
  Administered 2021-12-27: 3 mL via INTRAVENOUS

## 2021-12-25 MED ORDER — SODIUM CHLORIDE (PF) 0.9 % IJ SOLN
INTRAMUSCULAR | Status: AC
  Filled 2021-12-25: qty 50

## 2021-12-25 MED ORDER — LACTATED RINGERS IV BOLUS
1000.0000 mL | Freq: Once | INTRAVENOUS | Status: AC
  Administered 2021-12-25: 1000 mL via INTRAVENOUS

## 2021-12-25 MED ORDER — INSULIN ASPART 100 UNIT/ML IJ SOLN
0.0000 [IU] | Freq: Three times a day (TID) | INTRAMUSCULAR | Status: DC
  Administered 2021-12-26 (×2): 2 [IU] via SUBCUTANEOUS
  Administered 2021-12-26: 9 [IU] via SUBCUTANEOUS
  Administered 2021-12-27: 3 [IU] via SUBCUTANEOUS
  Filled 2021-12-25: qty 0.09

## 2021-12-25 MED ORDER — ENOXAPARIN SODIUM 30 MG/0.3ML IJ SOSY
30.0000 mg | PREFILLED_SYRINGE | INTRAMUSCULAR | Status: DC
  Administered 2021-12-26 – 2021-12-27 (×2): 30 mg via SUBCUTANEOUS
  Filled 2021-12-25 (×2): qty 0.3

## 2021-12-25 MED ORDER — INSULIN ASPART 100 UNIT/ML IJ SOLN
0.0000 [IU] | Freq: Every day | INTRAMUSCULAR | Status: DC
  Administered 2021-12-25: 5 [IU] via SUBCUTANEOUS
  Administered 2021-12-26: 2 [IU] via SUBCUTANEOUS
  Filled 2021-12-25: qty 0.05

## 2021-12-25 MED ORDER — DORZOLAMIDE HCL-TIMOLOL MAL 2-0.5 % OP SOLN
1.0000 [drp] | Freq: Two times a day (BID) | OPHTHALMIC | Status: DC
  Administered 2021-12-27: 1 [drp] via OPHTHALMIC
  Filled 2021-12-25: qty 10

## 2021-12-25 MED ORDER — IOHEXOL 350 MG/ML SOLN
100.0000 mL | Freq: Once | INTRAVENOUS | Status: AC | PRN
  Administered 2021-12-25: 75 mL via INTRAVENOUS

## 2021-12-25 NOTE — ED Provider Notes (Signed)
[facility] DEPT Provider Note   CSN: [id number] Arrival date & time: 12/25/21  0093     History  Chief Complaint  Patient presents with   Hypotension   Weakness    [name] Church is a 87 y.o. female.   Weakness    87 year old female with medical history significant for DM 2, osteoporosis, nonsustained VT, PAT who presents to the emergency department with near syncope.  The patient states that she was sitting at her dining room table when she suddenly felt lightheaded and weak.  She was afraid of getting up because she thought she might pass out.  She felt very near syncopal.  EMS arrived on scene and found the patient to be hypotensive BP 60/40, ashen gray.  She was orthostatic positive.  She was administered 800 cc of IV NS in route and had improvement to 100/40 blood pressure.  She was noted to have PVCs telemetry.  Her CBG was 168.  She states that she feels symptomatically improved.  She feels slightly dehydrated.  She denies any polyuria or polydipsia.  She denies any fever, chills, cough, chest pain.  She denies any palpitations.  She denies any abdominal pain, nausea, vomiting, diarrhea.  Home Medications Prior to Admission medications   Medication Sig Start Date End Date Taking? Authorizing Provider  acetaminophen (TYLENOL) 500 MG tablet Take 500-1,000 mg by mouth every 6 (six) hours as needed for mild pain.   Yes [provider]  aspirin 325 MG EC tablet Take 325 mg by mouth daily.   Yes [provider]  brimonidine (ALPHAGAN) 0.2 % ophthalmic solution Place 1 drop into both eyes 2 (two) times daily. 12/13/21  Yes [provider]  Cholecalciferol 25 MCG (1000 UT) tablet Take 1,000 Units by mouth daily.   Yes [provider]  dorzolamide-timolol (COSOPT) 22.3-6.8 MG/ML ophthalmic solution Place 1 drop into both eyes 2 (two) times daily. 08/08/20  Yes [provider]  ferrous sulfate 325 (65 FE) MG  tablet Take 325 mg by mouth daily with breakfast.   Yes [provider]  HUMALOG KWIKPEN 100 UNIT/ML KiwkPen 2-6 Units 3 (three) times daily before meals. 03/17/14  Yes [provider]  hydrochlorothiazide (MICROZIDE) 12.5 MG capsule Take 12.5 mg by mouth daily.   Yes [provider]  levothyroxine (SYNTHROID) 125 MCG tablet Take 125 mcg by mouth daily. 10/01/21  Yes [provider]  lisinopril (ZESTRIL) 5 MG tablet Take 5 mg by mouth daily.   Yes [provider]  loratadine (CLARITIN) 10 MG tablet Take 10 mg by mouth daily as needed for allergies.   Yes [provider]  metoprolol succinate (TOPROL-XL) 25 MG 24 hr tablet Take 1 tablet (25 mg total) by mouth daily. 12/06/21  Yes Evans Lance, MD  Multiple Minerals-Vitamins (CALCIUM & VIT D3 BONE HEALTH PO) Take 1 tablet by mouth daily.   Yes [provider]  Multiple Vitamin (MULTI-VITAMINS) TABS Take 1 tablet once a day   Yes [provider]  Netarsudil-Latanoprost (ROCKLATAN) 0.02-0.005 % SOLN Place 1 drop into both eyes at bedtime.   Yes [provider]  TRESIBA FLEXTOUCH 200 UNIT/ML FlexTouch Pen Inject 14 Units into the skin daily. 03/12/20  Yes [provider]  [place] test strip USE TO TEST BLOOD SUGARS QID 07/26/14   [provider]  ACCU-CHEK FASTCLIX LANCETS [place]  08/08/14   [provider]  BD PEN NEEDLE NANO U/F 32G X 4 MM  MISC USE AS DIRECTED 4-5 TIMES A DAY 06/12/14   [provider]  Continuous Blood Gluc Receiver (DEXCOM G6 RECEIVER) DEVI Use as directed for continuous glucose monitoring. 03/12/21   [provider]  Continuous Blood Gluc Sensor (DEXCOM G6 SENSOR) MISC SMARTSIG:1 Topical Every 10 Days 03/12/21   [provider]  Continuous Blood Gluc Sensor (DEXCOM G6 SENSOR) MISC Inject 1 sensor to the skin every 10 days for continuous glucose monitoring. 03/12/21   [provider]   Continuous Blood Gluc Transmit (DEXCOM G6 TRANSMITTER) MISC  03/12/21   [provider]  Continuous Blood Gluc Transmit (DEXCOM G6 TRANSMITTER) MISC Use as directed for continuous glucose monitoring. Reuse transmitter for 90 days then discard and replace. 03/12/21   [provider]  Lancet Devices ([employer] LANCING DEVICE) [place]  08/08/14   [provider]      Allergies    Mexiletine, Sulfa antibiotics, and Sulfamethoxazole    Review of Systems   Review of Systems  Neurological:  Positive for weakness and light-headedness.  All other systems reviewed and are negative.   Physical Exam Updated Vital Signs BP 120/75   Pulse 95   Temp 98.5 F (36.9 C) (Oral)   Resp 15   SpO2 97%  Physical Exam Vitals and nursing note reviewed.  Constitutional:      General: She is not in acute distress.    Appearance: She is well-developed.  HENT:     Head: Normocephalic and atraumatic.     Mouth/Throat:     Mouth: Mucous membranes are dry.  Eyes:     Conjunctiva/sclera: Conjunctivae normal.  Cardiovascular:     Rate and Rhythm: Normal rate and regular rhythm.     Pulses: Normal pulses.  Pulmonary:     Effort: Pulmonary effort is normal. No respiratory distress.     Breath sounds: Normal breath sounds.  Abdominal:     Palpations: Abdomen is soft.     Tenderness: There is no abdominal tenderness.  Musculoskeletal:        General: No swelling.     Cervical back: Neck supple.     Right lower leg: Edema present.     Left lower leg: Edema present.     Comments: Trace pitting edema bilaterally  Skin:    General: Skin is warm and dry.     Capillary Refill: Capillary refill takes less than 2 seconds.  Neurological:     General: No focal deficit present.     Mental Status: She is alert and oriented to person, place, and time.     Cranial Nerves: No cranial nerve deficit.     Sensory: No sensory deficit.     Motor: No weakness.     Coordination: Coordination normal.   Psychiatric:        Mood and Affect: Mood normal.     ED Results / Procedures / Treatments   Labs (all labs ordered are listed, but only abnormal results are displayed) Labs Reviewed  BASIC METABOLIC PANEL - Abnormal; Notable for the following components:      Result Value   Glucose, Bld 152 (*)    BUN 40 (*)    Creatinine, Ser 1.48 (*)    Calcium 8.7 (*)    GFR, Estimated 35 (*)    All other components within normal limits  CBC - Abnormal; Notable for the following components:   RBC 3.33 (*)    Hemoglobin 11.1 (*)    HCT 34.2 (*)  MCV 102.7 (*)    All other components within normal limits  BRAIN NATRIURETIC PEPTIDE - Abnormal; Notable for the following components:   B Natriuretic Peptide 207.8 (*)    All other components within normal limits  D-DIMER, QUANTITATIVE - Abnormal; Notable for the following components:   D-Dimer, Quant 1.23 (*)    All other components within normal limits  HEPATIC FUNCTION PANEL - Abnormal; Notable for the following components:   Total Protein 6.2 (*)    Albumin 3.1 (*)    All other components within normal limits  CBG MONITORING, ED - Abnormal; Notable for the following components:   Glucose-Capillary 153 (*)    All other components within normal limits  MAGNESIUM  URINALYSIS, ROUTINE W REFLEX MICROSCOPIC  TROPONIN I (HIGH SENSITIVITY)  TROPONIN I (HIGH SENSITIVITY)    EKG EKG Interpretation  Date/Time:  Wednesday December 25 2021 10:01:30 EDT Ventricular Rate:  75 PR Interval:  229 QRS Duration: 88 QT Interval:  394 QTC Calculation: 441 R Axis:   -15 Text Interpretation: Sinus rhythm Prolonged PR interval Borderline left axis deviation Low voltage, precordial leads Abnormal R-wave progression, early transition Confirmed by Regan Lemming ([id number]) on 12/25/2021 10:14:49 AM  Radiology CT Angio Chest PE W and/or Wo Contrast  Result Date: 12/25/2021 CLINICAL DATA:  Denies shortness of breath, no chest pain, elevated D-dimer,  hypotension EXAM: CT ANGIOGRAPHY CHEST WITH CONTRAST TECHNIQUE: Multidetector CT imaging of the chest was performed using the standard protocol during bolus administration of intravenous contrast. Multiplanar CT image reconstructions and MIPs were obtained to evaluate the vascular anatomy. RADIATION DOSE REDUCTION: This exam was performed according to the departmental dose-optimization program which includes automated exposure control, adjustment of the mA and/or kV according to patient size and/or use of iterative reconstruction technique. CONTRAST:  65m OMNIPAQUE IOHEXOL 350 MG/ML SOLN COMPARISON:  None Available. FINDINGS: Cardiovascular: Satisfactory opacification of the pulmonary arteries to the segmental level. No evidence of pulmonary embolism. Normal heart size. No pericardial effusion. Thoracic aortic atherosclerosis. Mediastinum/Nodes: No enlarged mediastinal, hilar, or axillary lymph nodes. Thyroid gland, trachea, and esophagus demonstrate no significant findings. Lungs/Pleura: No focal consolidation. Scarring in right upper lobe along the major fissure. No pleural effusion or pneumothorax. Upper Abdomen: No acute upper abdominal abnormality. Cholelithiasis partially visualized. Musculoskeletal: No acute osseous abnormality. No aggressive osseous lesion. Review of the MIP images confirms the above findings. IMPRESSION: 1. No pulmonary embolism. 2. No acute cardiopulmonary disease. 3. Cholelithiasis. 4.  Aortic Atherosclerosis (ICD10-I70.0). Electronically Signed   By: HKathreen DevoidM.D.   On: 12/25/2021 12:16   DG Chest Portable 1 View  Result Date: 12/25/2021 CLINICAL DATA:  Provided history: Near syncope. Additional history provided: Hypotension. Dizziness. Weakness. EXAM: PORTABLE CHEST 1 VIEW COMPARISON:  Prior chest radiographs 07/12/2020 and earlier. FINDINGS: Heart size within normal limits. Mild ill-defined opacity within medial left lung base, most compatible with atelectasis. No appreciable  airspace consolidation on the right. No evidence of pleural effusion or pneumothorax. No acute bony abnormality identified. IMPRESSION: Mild left basilar atelectasis. Otherwise, no evidence of acute cardiopulmonary abnormality. Electronically Signed   By: KKellie SimmeringD.O.   On: 12/25/2021 10:36    Procedures Procedures    Medications Ordered in ED Medications  sodium chloride (PF) 0.9 % injection (has no administration in time range)  lactated ringers bolus 1,000 mL (0 mLs Intravenous Stopped 12/25/21 1222)  iohexol (OMNIPAQUE) 350 MG/ML injection 100 mL (75 mLs Intravenous Contrast Given 12/25/21 1143)    ED Course/ Medical Decision  Making/ A&P                           Medical Decision Making Amount and/or Complexity of Data Reviewed Labs: ordered. Radiology: ordered.  Risk Prescription drug management. Decision regarding hospitalization.     87 year old female with medical history significant for DM 2, osteoporosis, nonsustained VT, PAT who presents to the emergency department with near syncope.  The patient states that she was sitting at her dining room table when she suddenly felt lightheaded and weak.  She was afraid of getting up because she thought she might pass out.  She felt very near syncopal.  EMS arrived on scene and found the patient to be hypotensive BP 60/40, ashen gray.  She was orthostatic positive.  She was administered 800 cc of IV NS in route and had improvement to 100/40 blood pressure.  She was noted to have PVCs telemetry.  Her CBG was 168.  She states that she feels symptomatically improved.  She feels slightly dehydrated.  She denies any polyuria or polydipsia.  She denies any fever, chills, cough, chest pain.  She denies any palpitations.  She denies any abdominal pain, nausea, vomiting, diarrhea.  On arrival, the patient was symptomatically improved, afebrile, not tachycardic or tachypneic, BP 122/58 after IV fluid resuscitation, saturating 100% on room air.   Mucous membranes were dry on exam.  The patient had a normal neurologic exam.  Differential diagnosis includes hypotension from hypovolemia, cardiac arrhythmia, sepsis, new onset heart failure.  Patient appears dry on exam and has responded well to initial fluid resuscitation.  She did have trace pitting edema bilaterally but had dry mucous membranes.  She denies any infectious symptoms at this time.  I reviewed the patient's most recent echocardiogram: The patient had a normal left ventricular EF with mild pulmonary hypertension and mild mitral regurgitation.  Her LVEF was 70 to 75%.  She denies any chest pain or shortness of breath at this time.  Her initial EKG revealed sinus rhythm, ventricular rate 75, mildly prolonged PR interval, PR 229, QTc normal at 441, borderline left axis deviation, no acute ischemic changes or syncope associated findings.  Of note, PVCs were noted on EKG and on cardiac telemetry.  The patient had no episodes of VT while in the ED.  A chest x-ray was performed which revealed no acute pulmonary abnormality, mild atelectasis present, no cardiomegaly.    Laboratory evaluation was significant for an elevated D-dimer, CBG 153, troponins x2 negative, hepatic function panel generally unremarkable, CBC without cytosis, mild anemia at 11.1, BNP nonspecifically elevated to 208, BMP with elevated serum creatinine to 1.48 but similarly elevated compared to prior measurements, magnesium normal at 1.9.  Given the patient's elevated D-dimer and near syncope and hypertension, a CTA was ordered to evaluate for PE.  A CTA PE study was performed which revealed no evidence of pulmonary embolism, no acute pulmonary disease, cholelithiasis was present.  Orthostatic vitals were positive with EMS and were positive after fluid resuscitation concerning for orthostatic hypotension.  Urinalysis was pending but the patient denied any urinary symptoms.  The patient responded well to IV fluid  resuscitation.  She feels symptomatically improved at this time.  Given the patient's near syncope, hypotension, concern for hypovolemia with dry mucous membranes and initial presentation of hypotension, hospitalist medicine was consulted for admission for observation.  Dr. Marylyn Ishihara of hospitalist medicine subsequently excepted the patient in admission.    Final Clinical Impression(s) /  ED Diagnoses Final diagnoses:  Dehydration  Hypotension, unspecified hypotension type  Near syncope    Rx / DC Orders ED Discharge Orders     None         Regan Lemming, MD 12/25/21 1912

## 2021-12-25 NOTE — ED Triage Notes (Signed)
BIBA Per EMS: Pt coming from home w/ c/o hypotension initial BP 60/40. Pt felt dizzy, weak. A&Ox4. Orthostatic pos  830m given en route.  100/40 BP currently.  Trigeminal PVC at this time.  168 CBG  90 HR  99% RA 16 RR

## 2021-12-25 NOTE — H&P (Signed)
History and Physical    Patient: [name] Church ERD:[id number] DOB: 02/11/1935 DOA: 12/25/2021 DOS: the patient was seen and examined on 12/25/2021 PCP: Jonathon Jordan, MD  Patient coming from: Home  Chief Complaint:  Chief Complaint  Patient presents with   Hypotension   Weakness   HPI: [name] Church is a 87 y.o. female with medical history significant of DM, HTN, glaucoma. Presenting with weakness. She reports that she was in her normal state of health until this morning. As she was trying to get up from the breakfast table, she became lightheaded and dizzy. She felt her vision changing. So, she sat down and laid her head down. She called EMS for help. She didn't have any palpitations, chest pain or dyspnea. She didn't have an N/V. She denies any recent sickness or sick contacts. She denies any other aggravating or alleviating factors.    Review of Systems: As mentioned in the history of present illness. All other systems reviewed and are negative. Past Medical History:  Diagnosis Date   Diabetes mellitus without complication (HCC)    Edema    Elevated serum immunoglobulin free light chain level 11/29/2013   Macrocytosis without anemia 11/29/2013   Nonsustained ventricular tachycardia (HCC)    115 episodes of NSVT on mointor up to 6 beats in a row with maximum HR of 197bpm on heart monitor 06/2020   Osteoporosis    PAT (paroxysmal atrial tachycardia)    nonsustained atrial tachycardia up to 23 seconds at a time on heart monitor 06/2020   Thyroid disease    Unspecified deficiency anemia 11/29/2013   Past Surgical History:  Procedure Laterality Date   APPENDECTOMY     EYE SURGERY Left 03/2013   eye bleeds internally   EYE SURGERY Right 03/2013   FRACTURE SURGERY Left 2012   leg with metal fixation   Social History:  reports that she has never smoked. She has never used smokeless tobacco. She reports that she does not drink alcohol and does not use drugs.  Allergies   Allergen Reactions   Mexiletine     Other reaction(s): Other (See Comments) Off balance    Sulfa Antibiotics    Sulfamethoxazole Rash    Family History  Problem Relation Age of Onset   Cancer Mother        colon ca   Cancer Brother        prostate ca   Cancer Brother        lung ca    Prior to Admission medications   Medication Sig Start Date End Date Taking? Authorizing Provider  acetaminophen (TYLENOL) 500 MG tablet Take 500-1,000 mg by mouth every 6 (six) hours as needed for mild pain.   Yes [provider]  aspirin 325 MG EC tablet Take 325 mg by mouth daily.   Yes [provider]  brimonidine (ALPHAGAN) 0.2 % ophthalmic solution Place 1 drop into both eyes 2 (two) times daily. 12/13/21  Yes [provider]  Cholecalciferol 25 MCG (1000 UT) tablet Take 1,000 Units by mouth daily.   Yes [provider]  dorzolamide-timolol (COSOPT) 22.3-6.8 MG/ML ophthalmic solution Place 1 drop into both eyes 2 (two) times daily. 08/08/20  Yes [provider]  ferrous sulfate 325 (65 FE) MG tablet Take 325 mg by mouth daily with breakfast.   Yes [provider]  HUMALOG KWIKPEN 100 UNIT/ML KiwkPen 2-6 Units 3 (three) times daily before meals. 03/17/14  Yes [provider]  hydrochlorothiazide (MICROZIDE) 12.5  MG capsule Take 12.5 mg by mouth daily.   Yes [provider]  levothyroxine (SYNTHROID) 125 MCG tablet Take 125 mcg by mouth daily. 10/01/21  Yes [provider]  lisinopril (ZESTRIL) 5 MG tablet Take 5 mg by mouth daily.   Yes [provider]  loratadine (CLARITIN) 10 MG tablet Take 10 mg by mouth daily as needed for allergies.   Yes [provider]  metoprolol succinate (TOPROL-XL) 25 MG 24 hr tablet Take 1 tablet (25 mg total) by mouth daily. 12/06/21  Yes Evans Lance, MD  Multiple Minerals-Vitamins (CALCIUM & VIT D3 BONE HEALTH PO) Take 1 tablet by mouth daily.   Yes [provider]  Multiple Vitamin (MULTI-VITAMINS) TABS Take 1 tablet once a day   Yes [provider]  Netarsudil-Latanoprost (ROCKLATAN) 0.02-0.005 % SOLN Place 1 drop into both eyes at bedtime.   Yes [provider]  TRESIBA FLEXTOUCH 200 UNIT/ML FlexTouch Pen Inject 14 Units into the skin daily. 03/12/20  Yes [provider]  [place] test strip USE TO TEST BLOOD SUGARS QID 07/26/14   [provider]  ACCU-CHEK FASTCLIX LANCETS [place]  08/08/14   [provider]  BD PEN NEEDLE NANO U/F 32G X 4 MM MISC USE AS DIRECTED 4-5 TIMES A DAY 06/12/14   [provider]  Continuous Blood Gluc Receiver (DEXCOM G6 RECEIVER) DEVI Use as directed for continuous glucose monitoring. 03/12/21   [provider]  Continuous Blood Gluc Sensor (DEXCOM G6 SENSOR) MISC SMARTSIG:1 Topical Every 10 Days 03/12/21   [provider]  Continuous Blood Gluc Sensor (DEXCOM G6 SENSOR) MISC Inject 1 sensor to the skin every 10 days for continuous glucose monitoring. 03/12/21   [provider]  Continuous Blood Gluc Transmit (DEXCOM G6 TRANSMITTER) MISC  03/12/21   [provider]  Continuous Blood Gluc Transmit (DEXCOM G6 TRANSMITTER) MISC Use as directed for continuous glucose monitoring. Reuse transmitter for 90 days then discard and replace. 03/12/21   [provider]  Lancet Devices ([employer] LANCING DEVICE) [place]  08/08/14   [provider]    Physical Exam: Vitals:   12/25/21 1430 12/25/21 1445 12/25/21 1500 12/25/21 1515  BP: 119/67 (!) 130/51 128/61 (!) 135/53  Pulse: 93 93 96 95  Resp: '17 14  16  '$ Temp:      TempSrc:      SpO2: 99% 100% 100% 100%   General: 87 y.o. female resting in bed in NAD Eyes: PERRL, normal sclera ENMT: Nares patent w/o discharge, orophaynx clear, dentition normal, ears w/o discharge/lesions/ulcers Neck: Supple, trachea midline Cardiovascular: RRR, +S1, S2, no m/g/r, equal pulses  throughout Respiratory: CTABL, no w/r/r, normal WOB GI: BS+, NDNT, no masses noted, no organomegaly noted MSK: No c/c; 1+ ble edema Neuro: A&O x 3, no focal deficits Psyc: Appropriate interaction and affect, calm/cooperative  Data Reviewed:  Results for orders placed or performed during the hospital encounter of 12/25/21 (from the past 24 hour(s))  Basic metabolic panel     Status: Abnormal   Collection Time: 12/25/21 10:00 AM  Result Value Ref Range   Sodium 140 135 - 145 mmol/L   Potassium 4.1 3.5 - 5.1 mmol/L   Chloride 108 98 - 111 mmol/L   CO2 25 22 - 32 mmol/L   Glucose, Bld 152 (H) 70 - 99 mg/dL   BUN 40 (H) 8 - 23 mg/dL   Creatinine, Ser 1.48 (H) 0.44 - 1.00 mg/dL   Calcium 8.7 (L)  8.9 - 10.3 mg/dL   GFR, Estimated 35 (L) >60 mL/min   Anion gap 7 5 - 15  CBC     Status: Abnormal   Collection Time: 12/25/21 10:00 AM  Result Value Ref Range   WBC 7.6 4.0 - 10.5 K/uL   RBC 3.33 (L) 3.87 - 5.11 MIL/uL   Hemoglobin 11.1 (L) 12.0 - 15.0 g/dL   HCT 34.2 (L) 36.0 - 46.0 %   MCV 102.7 (H) 80.0 - 100.0 fL   MCH 33.3 26.0 - 34.0 pg   MCHC 32.5 30.0 - 36.0 g/dL   RDW 13.0 11.5 - 15.5 %   Platelets 216 150 - 400 K/uL   nRBC 0.0 0.0 - 0.2 %  Troponin I (High Sensitivity)     Status: None   Collection Time: 12/25/21 10:00 AM  Result Value Ref Range   Troponin I (High Sensitivity) 7 <18 ng/L  Brain natriuretic peptide     Status: Abnormal   Collection Time: 12/25/21 10:00 AM  Result Value Ref Range   B Natriuretic Peptide 207.8 (H) 0.0 - 100.0 pg/mL  D-dimer, quantitative     Status: Abnormal   Collection Time: 12/25/21 10:00 AM  Result Value Ref Range   D-Dimer, Quant 1.23 (H) 0.00 - 0.50 ug/mL-FEU  Magnesium     Status: None   Collection Time: 12/25/21 10:00 AM  Result Value Ref Range   Magnesium 1.9 1.7 - 2.4 mg/dL  CBG monitoring, ED     Status: Abnormal   Collection Time: 12/25/21 10:08 AM  Result Value Ref Range   Glucose-Capillary 153 (H) 70 - 99 mg/dL  Troponin  I (High Sensitivity)     Status: None   Collection Time: 12/25/21 12:27 PM  Result Value Ref Range   Troponin I (High Sensitivity) 7 <18 ng/L  Hepatic function panel     Status: Abnormal   Collection Time: 12/25/21 12:27 PM  Result Value Ref Range   Total Protein 6.2 (L) 6.5 - 8.1 g/dL   Albumin 3.1 (L) 3.5 - 5.0 g/dL   AST 26 15 - 41 U/L   ALT 22 0 - 44 U/L   Alkaline Phosphatase 42 38 - 126 U/L   Total Bilirubin 0.6 0.3 - 1.2 mg/dL   Bilirubin, Direct 0.2 0.0 - 0.2 mg/dL   Indirect Bilirubin 0.4 0.3 - 0.9 mg/dL   CTA Chest 1. No pulmonary embolism. 2. No acute cardiopulmonary disease. 3. Cholelithiasis. 4.  Aortic Atherosclerosis (ICD10-I70.0).  EKG: sinus, no st elevations  Assessment and Plan: Near syncope     - place in obs, tele     - orthostatic positive, continue fluids     - check echo     - EKG as above     - CTA as above     - no signs of infection, but UA pending, she denies dysuria  Elevated d-dimer     - CTA chest is negative     - check venous dopplers   Hx of HTN Hypotension     - she reports at baseline, she does not take in a lot of fluids; additionally she is on a diuretic; this combination is likely our cause for orthostatic hypotension     - fluids, hold home BP meds for today  PVCs     - history of PVC, followed by cardiology; continue outpt follow up  DMt1     - A1c, SSI, DM diet, glucose checks  CKD3b     -  at baseline, follow  Hypothyroidism     - continue home regimen  Glaucoma     - continue home regimen  Macrocytic anemia     - at baseline, no evidence of bleed, follow  Advance Care Planning:   Code Status: DNR, confirmed through multiple lines of questioning w/ daughter at bedside  Consults: None  Family Communication: w/ dtr at bedside  Severity of Illness: The appropriate patient status for this patient is OBSERVATION. Observation status is judged to be reasonable and necessary in order to provide the required intensity  of service to ensure the patient's safety. The patient's presenting symptoms, physical exam findings, and initial radiographic and laboratory data in the context of their medical condition is felt to place them at decreased risk for further clinical deterioration. Furthermore, it is anticipated that the patient will be medically stable for discharge from the hospital within 2 midnights of admission.   Author: Jonnie Finner, DO 12/25/2021 3:40 PM  For on call review [email or web address].

## 2021-12-26 ENCOUNTER — Observation Stay (HOSPITAL_BASED_OUTPATIENT_CLINIC_OR_DEPARTMENT_OTHER): Payer: Medicare Other

## 2021-12-26 DIAGNOSIS — N183 Chronic kidney disease, stage 3 unspecified: Secondary | ICD-10-CM

## 2021-12-26 DIAGNOSIS — R7989 Other specified abnormal findings of blood chemistry: Secondary | ICD-10-CM | POA: Diagnosis not present

## 2021-12-26 DIAGNOSIS — D539 Nutritional anemia, unspecified: Secondary | ICD-10-CM

## 2021-12-26 DIAGNOSIS — E039 Hypothyroidism, unspecified: Secondary | ICD-10-CM

## 2021-12-26 DIAGNOSIS — R55 Syncope and collapse: Secondary | ICD-10-CM

## 2021-12-26 DIAGNOSIS — E109 Type 1 diabetes mellitus without complications: Secondary | ICD-10-CM

## 2021-12-26 DIAGNOSIS — H409 Unspecified glaucoma: Secondary | ICD-10-CM

## 2021-12-26 DIAGNOSIS — I493 Ventricular premature depolarization: Secondary | ICD-10-CM

## 2021-12-26 DIAGNOSIS — I10 Essential (primary) hypertension: Secondary | ICD-10-CM | POA: Diagnosis not present

## 2021-12-26 DIAGNOSIS — I951 Orthostatic hypotension: Secondary | ICD-10-CM | POA: Diagnosis not present

## 2021-12-26 LAB — COMPREHENSIVE METABOLIC PANEL
ALT: 20 U/L (ref 0–44)
AST: 27 U/L (ref 15–41)
Albumin: 2.7 g/dL — ABNORMAL LOW (ref 3.5–5.0)
Alkaline Phosphatase: 39 U/L (ref 38–126)
Anion gap: 5 (ref 5–15)
BUN: 37 mg/dL — ABNORMAL HIGH (ref 8–23)
CO2: 25 mmol/L (ref 22–32)
Calcium: 8.6 mg/dL — ABNORMAL LOW (ref 8.9–10.3)
Chloride: 110 mmol/L (ref 98–111)
Creatinine, Ser: 1.37 mg/dL — ABNORMAL HIGH (ref 0.44–1.00)
GFR, Estimated: 38 mL/min — ABNORMAL LOW (ref >60)
Glucose, Bld: 359 mg/dL — ABNORMAL HIGH (ref 70–99)
Potassium: 4.2 mmol/L (ref 3.5–5.1)
Sodium: 140 mmol/L (ref 135–145)
Total Bilirubin: 0.7 mg/dL (ref 0.3–1.2)
Total Protein: 5.6 g/dL — ABNORMAL LOW (ref 6.5–8.1)

## 2021-12-26 LAB — GLUCOSE, CAPILLARY
Glucose-Capillary: 174 mg/dL — ABNORMAL HIGH (ref 70–99)
Glucose-Capillary: 202 mg/dL — ABNORMAL HIGH (ref 70–99)

## 2021-12-26 LAB — CBC
HCT: 29.9 % — ABNORMAL LOW (ref 36.0–46.0)
Hemoglobin: 10.1 g/dL — ABNORMAL LOW (ref 12.0–15.0)
MCH: 34.4 pg — ABNORMAL HIGH (ref 26.0–34.0)
MCHC: 33.8 g/dL (ref 30.0–36.0)
MCV: 101.7 fL — ABNORMAL HIGH (ref 80.0–100.0)
Platelets: 194 10*3/uL (ref 150–400)
RBC: 2.94 MIL/uL — ABNORMAL LOW (ref 3.87–5.11)
RDW: 13.1 % (ref 11.5–15.5)
WBC: 6.3 10*3/uL (ref 4.0–10.5)
nRBC: 0 % (ref 0.0–0.2)

## 2021-12-26 LAB — ECHOCARDIOGRAM COMPLETE
AR max vel: 2 cm2
AV Area VTI: 2.37 cm2
AV Area mean vel: 2.2 cm2
AV Mean grad: 6 mmHg
AV Peak grad: 11 mmHg
Ao pk vel: 1.66 m/s
Area-P 1/2: 3.37 cm2
Calc EF: 60.7 %
Height: 64 in
S' Lateral: 2.7 cm
Single Plane A2C EF: 56.1 %
Single Plane A4C EF: 63.7 %
Weight: 3008 oz

## 2021-12-26 LAB — HEMOGLOBIN A1C
Hgb A1c MFr Bld: 7.1 % — ABNORMAL HIGH (ref 4.8–5.6)
Mean Plasma Glucose: 157.07 mg/dL

## 2021-12-26 LAB — CBG MONITORING, ED
Glucose-Capillary: 370 mg/dL — ABNORMAL HIGH (ref 70–99)
Glucose-Capillary: 176 mg/dL — ABNORMAL HIGH (ref 70–99)

## 2021-12-26 MED ORDER — INSULIN ASPART 100 UNIT/ML IJ SOLN
3.0000 [IU] | Freq: Three times a day (TID) | INTRAMUSCULAR | Status: DC
  Administered 2021-12-26 – 2021-12-27 (×3): 3 [IU] via SUBCUTANEOUS
  Filled 2021-12-26: qty 0.03

## 2021-12-26 MED ORDER — INSULIN GLARGINE-YFGN 100 UNIT/ML ~~LOC~~ SOLN
10.0000 [IU] | Freq: Every day | SUBCUTANEOUS | Status: DC
  Administered 2021-12-26 – 2021-12-27 (×2): 10 [IU] via SUBCUTANEOUS
  Filled 2021-12-26 (×2): qty 0.1

## 2021-12-26 MED ORDER — DOCUSATE SODIUM 100 MG PO CAPS
100.0000 mg | ORAL_CAPSULE | Freq: Two times a day (BID) | ORAL | Status: DC
  Administered 2021-12-26 – 2021-12-27 (×2): 100 mg via ORAL
  Filled 2021-12-26 (×3): qty 1

## 2021-12-26 MED ORDER — POLYETHYLENE GLYCOL 3350 17 G PO PACK
17.0000 g | PACK | Freq: Once | ORAL | Status: AC
  Administered 2021-12-26: 17 g via ORAL
  Filled 2021-12-26: qty 1

## 2021-12-26 NOTE — Plan of Care (Signed)
  Problem: Coping: Goal: Ability to adjust to condition or change in health will improve Outcome: Progressing   Problem: Metabolic: Goal: Ability to maintain appropriate glucose levels will improve Outcome: Progressing   Problem: Nutritional: Goal: Maintenance of adequate nutrition will improve Outcome: Progressing   Problem: Skin Integrity: Goal: Risk for impaired skin integrity will decrease Outcome: Progressing   

## 2021-12-26 NOTE — Inpatient Diabetes Management (Signed)
Inpatient Diabetes Program Recommendations  AACE/ADA: New Consensus Statement on Inpatient Glycemic Control (2015)  Target Ranges:  Prepandial:   less than 140 mg/dL      Peak postprandial:   less than 180 mg/dL (1-2 hours)      Critically ill patients:  140 - 180 mg/dL   Lab Results  Component Value Date   GLUCAP 370 (H) 12/26/2021    Review of Glycemic Control  Latest Reference Range & Units 12/25/21 20:08 12/25/21 22:23 12/26/21 06:38  Glucose-Capillary 70 - 99 mg/dL 366 (H) 398 (H) 370 (H)  (H): Data is abnormally high Diabetes history: Type 1 DM (requires basal/bolus) Outpatient Diabetes medications: Tresiba 14 units Qd, Humalog 2-6 units TID Current orders for Inpatient glycemic control: Novolog 0-9 units TID & HS  Inpatient Diabetes Program Recommendations:    A1C in process.  Consider adding Semglee 10 units QD and Novolog 3 units TID (assuming patient is consuming >50% of meals).   Thanks, Bronson Curb, MSN, RNC-OB Diabetes Coordinator [phone] (8a-5p)

## 2021-12-26 NOTE — Evaluation (Addendum)
Physical Therapy Evaluation Patient Details Name: [name] Church MRN: [id number] DOB: 02-Oct-1934 Today's Date: 12/26/2021  History of Present Illness  Patient is 87 y.o. female with medical history significant of DM, HTN, glaucoma. Presenting with weakness. She reports that she was in her normal state of health until this morning. As she was trying to get up from the breakfast table, she became lightheaded and dizzy. She felt her vision changing. So, she sat down and laid her head down. She called EMS for help. She didn't have any palpitations, chest pain or dyspnea. She didn't have an N/V. She denies any recent sickness or sick contacts. She denies any other aggravating or alleviating factors.    Clinical Impression  [name] Church is 87 y.o. female admitted with above HPI and diagnosis. Patient is currently limited by functional impairments below (see PT problem list). Patient lives alone and is independent with [facility] for mobility at baseline. PT required min assist for transfers and to take small steps alone EOB. Pt noted to be orthostatic with mobility, denied symptoms of hypotension throughout session. Patient will benefit from continued skilled PT interventions to address impairments and progress independence with mobility, recommending HHPT once medically ready for discharge home. Acute PT will follow and progress as able.     Orthostatic VS for the past 24 hrs:  BP- Lying Pulse- Lying BP- Sitting Pulse- Sitting BP- Standing at 0 minutes Pulse- Standing at 0 minutes BP- Standing at 3 minutes Pulse- Standing at 3 minutes  12/26/21 1513 126/58 81 121/43 82 (!) 79/51 92       12/26/21 1526 12/26/21 1545 12/26/21 1546  Vital Signs  Pulse Rate 86 88 82  Pulse Rate Source Monitor Dinamap Dinamap  BP (!) 127/33 (!) 89/63 (!) 123/48  BP Location Left Arm Left Arm Left Arm  BP Method Automatic Automatic Automatic  Patient Position (if appropriate) Sitting Sitting Lying       Recommendations  for follow up therapy are one component of a multi-disciplinary discharge planning process, led by the attending physician.  Recommendations may be updated based on patient status, additional functional criteria and insurance authorization.  Follow Up Recommendations Home health PT      Assistance Recommended at Discharge Frequent or constant Supervision/Assistance  Patient can return home with the following  A little help with walking and/or transfers;A little help with bathing/dressing/bathroom;Assistance with cooking/housework;Direct supervision/assist for medications management;Assist for transportation;Help with stairs or ramp for entrance    Equipment Recommendations None recommended by PT  Recommendations for Other Services       Functional Status Assessment Patient has had a recent decline in their functional status and demonstrates the ability to make significant improvements in function in a reasonable and predictable amount of time.     Precautions / Restrictions Precautions Precautions: Fall Restrictions Weight Bearing Restrictions: No      Mobility  Bed Mobility Overal bed mobility: Needs Assistance Bed Mobility: Supine to Sit     Supine to sit: Min guard, HOB elevated     General bed mobility comments: extra time    Transfers Overall transfer level: Needs assistance Equipment used: Rolling walker (2 wheels) Transfers: Sit to/from Stand, Bed to chair/wheelchair/BSC Sit to Stand: Min guard, Min assist   Step pivot transfers: Min assist       General transfer comment: min guard on first rise from EOB with pt using bil UE's to power up and extra time. min assist on second rise and to take small  steps to move bed<>BSC.    Ambulation/Gait Ambulation/Gait assistance: Min assist Gait Distance (Feet): 5 Feet Assistive device: Rolling walker (2 wheels) Gait Pattern/deviations: Step-to pattern, Decreased stride length, Shuffle Gait velocity: decr      General Gait Details: pt took small shuffled steps forward and back with min assist required to manage/advance RW. Small side steps alone EOB to move to [facility], pt required demonstration to sequence and assist to guide turn.  Stairs            Wheelchair Mobility    Modified Rankin (Stroke Patients Only)       Balance Overall balance assessment: Needs assistance Sitting-balance support: Feet supported Sitting balance-Leahy Scale: Good     Standing balance support: During functional activity, Bilateral upper extremity supported, Reliant on assistive device for balance Standing balance-Leahy Scale: Fair                               Pertinent Vitals/Pain Pain Assessment Pain Assessment: No/denies pain    Home Living Family/patient expects to be discharged to:: Private residence Living Arrangements: Alone Available Help at Discharge: Family Type of Home: House Home Access: Stairs to enter Entrance Stairs-Rails: None Entrance Stairs-Number of Steps: 1   Home Layout: One level Home Equipment: Conservation officer, nature (2 wheels);Cane - single point;Rollator (4 wheels);Shower seat Additional Comments: lives alone, daughter is available intermittently to help.    Prior Function Prior Level of Function : Independent/Modified Independent             Mobility Comments: pt uses [facility], still drives to pick up online grocery order and carries groceries in from car herself. ADLs Comments: pt cooks and has a cleaning service, does all bathing and dressig independently     Hand Dominance   Dominant Hand: Right    Extremity/Trunk Assessment   Upper Extremity Assessment Upper Extremity Assessment: Overall WFL for tasks assessed    Lower Extremity Assessment Lower Extremity Assessment: Overall WFL for tasks assessed    Cervical / Trunk Assessment Cervical / Trunk Assessment: Normal  Communication   Communication: No difficulties  Cognition Arousal/Alertness:  Awake/alert Behavior During Therapy: WFL for tasks assessed/performed Overall Cognitive Status: Within Functional Limits for tasks assessed                                          General Comments      Exercises     Assessment/Plan    PT Assessment Patient needs continued PT services  PT Problem List Decreased strength;Decreased activity tolerance;Decreased mobility;Decreased balance;Decreased knowledge of use of DME;Decreased safety awareness;Decreased knowledge of precautions;Obesity;Cardiopulmonary status limiting activity       PT Treatment Interventions DME instruction;Gait training;Stair training;Functional mobility training;Therapeutic activities;Therapeutic exercise;Balance training;Patient/family education    PT Goals (Current goals can be found in the Care Plan section)  Acute Rehab PT Goals Patient Stated Goal: get home PT Goal Formulation: With patient Time For Goal Achievement: 01/09/22 Potential to Achieve Goals: Good    Frequency Min 3X/week     Co-evaluation               AM-PAC PT "6 Clicks" Mobility  Outcome Measure Help needed turning from your back to your side while in a flat bed without using bedrails?: A Little Help needed moving from lying on your back to sitting on the side  of a flat bed without using bedrails?: A Little Help needed moving to and from a bed to a chair (including a wheelchair)?: A Little Help needed standing up from a chair using your arms (e.g., wheelchair or bedside chair)?: A Little Help needed to walk in hospital room?: A Little Help needed climbing 3-5 steps with a railing? : A Lot 6 Click Score: 17    End of Session Equipment Utilized During Treatment: Gait belt Activity Tolerance: Patient tolerated treatment well Patient left: in chair;with call bell/phone within reach;with chair alarm set Nurse Communication: Mobility status PT Visit Diagnosis: Unsteadiness on feet (R26.81);Muscle weakness  (generalized) (M62.81);Difficulty in walking, not elsewhere classified (R26.2)    Time: 6568-1275 PT Time Calculation (min) (ACUTE ONLY): 42 min   Charges:   PT Evaluation $PT Eval Low Complexity: 1 Low PT Treatments $Therapeutic Activity: 23-37 mins        Verner Mould, DPT Acute Rehabilitation Services Office [phone]  12/26/21 5:15 PM

## 2021-12-26 NOTE — Hospital Course (Addendum)
[name] Church is a 87 y.o. provide with past medical history of diabetes mellitus, hypertension, glaucoma presented to hospital with generalized weakness dizziness lightheadedness when trying to get up from sitting down position.  Initial vitals in the ED was within normal range.  Labs showed hemoglobin of 11.1.  Creatinine of 1.4.  D-dimer was elevated at 1.2.  Urinalysis with leukocytes and 6-10 white cells.  Patient was afebrile.  Assessment and Plan:  Near syncope Continue to monitor closely.  Patient had positive orthostatics.  CTA of the chest with no pulmonary embolism.  No signs of infection.  Patient received IV fluids with improvement in her blood pressure and symptoms.  D-dimer is elevated so ultrasound of the lower extremity was ordered.  Elevated d-dimer CTA of the chest is negative.  Check ultrasound of the lower extremity, pending.  Hx of HTN Hypotension Patient was on diuretic at home.  Holding antihypertensives at this time.  Patient was advised to hold HCTZ for next couple of days prior to reinitiating.  Consciousness on changing positions.   PVCs Follows up with the cardiology as outpatient.  Continue aspirin 325 mg   Insulin-dependent diabetes mellitus with hyperglycemia. Continue insulin.  Diabetic diet, hemoglobin A1c of 7.1.   Diabetic coordinator on board.   CKD3b Creatinine on presentation at 1.4.  Today at 1.3.   Hypothyroidism Continue Synthroid   Glaucoma Continue Alphagan and Cosopt.   Macrocytic anemia At baseline.

## 2021-12-26 NOTE — Progress Notes (Signed)
  Echocardiogram 2D Echocardiogram has been performed.  Lana Fish 12/26/2021, 11:36 AM

## 2021-12-26 NOTE — Progress Notes (Signed)
PROGRESS NOTE    [name] Church  WFU:[id number] DOB: [birth year] DOA: 12/25/2021 PCP: Jonathon Jordan, MD    Brief Narrative:  [name] Church is a 87 y.o. provide with past medical history of diabetes mellitus, hypertension, glaucoma presented to hospital with generalized weakness dizziness lightheadedness when trying to get up from sitting down position.  Initial vitals in the ED was within normal range.  Labs showed hemoglobin of 11.1.  Creatinine of 1.4.  D-dimer was elevated at 1.2.  Urinalysis with leukocytes and 6-10 white cells.  Patient was afebrile.  Assessment and Plan: Principal Problem:   Syncope Active Problems:   Macrocytic anemia   CKD (chronic kidney disease) stage 3, GFR 30-59 ml/min (HCC)   Primary hypothyroidism   DM type 1 (diabetes mellitus, type 1) (HCC)   Essential hypertension   PVC's (premature ventricular contractions)   Glaucoma   Hypotension   Elevated d-dimer   Near syncope secondary to orthostatic hypotension. Continue to monitor closely.  Patient had positive orthostatics.  CTA of the chest with no pulmonary embolism.  No signs of infection.  Patient received IV fluids with improvement in her blood pressure and symptoms but still has significant orthostatics on standing.  D-dimer is elevated and lower extremity ultrasound was done without evidence of DVT.  Continue with IV fluids for 1 more day. Will add compression stockings.  Elevated d-dimer CTA of the chest is negative.  Check ultrasound of the lower extremity was negative for DVT.  Hx of HTN Hypotension Patient was on diuretic at home.  Holding antihypertensives at this time.     PVCs Follows up with the cardiology as outpatient.  Continue aspirin 325 mg   Insulin-dependent diabetes mellitus with hyperglycemia. Continue insulin.  Diabetic diet, hemoglobin A1c of 7.1.   Diabetic coordinator on board.   CKD3b Creatinine on presentation at 1.4.  Today at 1.3.   Hypothyroidism Continue  Synthroid   Glaucoma Continue Alphagan and Cosopt.   Macrocytic anemia At baseline.        DVT prophylaxis: enoxaparin (LOVENOX) injection 30 mg Start: 12/25/21 2130   Code Status:     Code Status: DNR  Disposition: Home likely 12/27/2021  Status is: Observation  The patient will require care spanning > 2 midnights and should be moved to inpatient because: Orthostatic hypotension, IV fluids   Family Communication: Spoke with the patient's daughter on the phone.  Consultants:  None  Procedures:  None  Antimicrobials:  None  Anti-infectives (From admission, onward)    None       Subjective: Today, patient was seen and examined at bedside.  Patient denies any dizziness lightheadedness and feels better but noted to be severely orthostatic on blood pressure check despite fluids.  Objective: Vitals:   12/26/21 1253 12/26/21 1526 12/26/21 1545 12/26/21 1546  BP: (!) 130/55 (!) 127/33 (!) 89/63 (!) 123/48  Pulse: 85 86 88 82  Resp: 18     Temp: 99.1 F (37.3 C)     TempSrc: Oral     SpO2: 97%     Weight:      Height:        Intake/Output Summary (Last 24 hours) at 12/26/2021 1611 Last data filed at 12/26/2021 1500 Gross per 24 hour  Intake 140 ml  Output 2 ml  Net 138 ml   Filed Weights   12/26/21 0529  Weight: 85.3 kg    Physical Examination:  Body mass index is 32.27 kg/m.  General: Obese built  not  in obvious distress HENT:   No scleral pallor or icterus noted. Oral mucosa is moist.  Chest:  Clear breath sounds.  Diminished breath sounds bilaterally. No crackles or wheezes.  CVS: S1 &S2 heard. No murmur.  Regular rate and rhythm. Abdomen: Soft, nontender, nondistended.  Bowel sounds are heard.   Extremities: No cyanosis, clubbing or edema.  Peripheral pulses are palpable. Psych: Alert, awake and oriented, normal mood CNS:  No cranial nerve deficits.  Power equal in all extremities.   Skin: Warm and dry.  No rashes noted.  Data Reviewed:    CBC: Recent Labs  Lab 12/25/21 1000 12/26/21 0432  WBC 7.6 6.3  HGB 11.1* 10.1*  HCT 34.2* 29.9*  MCV 102.7* 101.7*  PLT 216 086    Basic Metabolic Panel: Recent Labs  Lab 12/25/21 1000 12/26/21 0432  NA 140 140  K 4.1 4.2  CL 108 110  CO2 25 25  GLUCOSE 152* 359*  BUN 40* 37*  CREATININE 1.48* 1.37*  CALCIUM 8.7* 8.6*  MG 1.9  --     Liver Function Tests: Recent Labs  Lab 12/25/21 1227 12/26/21 0432  AST 26 27  ALT 22 20  ALKPHOS 42 39  BILITOT 0.6 0.7  PROT 6.2* 5.6*  ALBUMIN 3.1* 2.7*     Radiology Studies: VAS [place] LOWER EXTREMITY VENOUS (DVT)  Result Date: 12/26/2021  Lower Venous DVT Study Patient Name:  [name] Church  Date of Exam:   12/26/2021 Medical Rec #: [id number]        Accession #:    [id number] Date of Birth: September 10, 1934        Patient Gender: F Patient Age:   53 years Exam Location:  [facility] Procedure:      VAS [place] LOWER EXTREMITY VENOUS (DVT) Referring Phys: Cherylann Ratel --------------------------------------------------------------------------------  Indications: Elevated d-dimer.  Limitations: Poor ultrasound/tissue interface. Performing Technologist: Maudry Mayhew MHA, RDMS, RVT, RDCS  Examination Guidelines: A complete evaluation includes B-mode imaging, spectral Doppler, color Doppler, and power Doppler as needed of all accessible portions of each vessel. Bilateral testing is considered an integral part of a complete examination. Limited examinations for reoccurring indications may be performed as noted. The reflux portion of the exam is performed with the patient in reverse Trendelenburg.  +---------+---------------+---------+-----------+----------+--------------+ RIGHT    CompressibilityPhasicitySpontaneityPropertiesThrombus Aging +---------+---------------+---------+-----------+----------+--------------+ CFV      Full           Yes      Yes                                  +---------+---------------+---------+-----------+----------+--------------+ SFJ      Full                                                        +---------+---------------+---------+-----------+----------+--------------+ FV Prox  Full                                                        +---------+---------------+---------+-----------+----------+--------------+ FV Mid   Full                                                        +---------+---------------+---------+-----------+----------+--------------+  FV DistalFull                                                        +---------+---------------+---------+-----------+----------+--------------+ PFV      Full                                                        +---------+---------------+---------+-----------+----------+--------------+ POP      Full           Yes      Yes                                 +---------+---------------+---------+-----------+----------+--------------+ PTV      Full                                                        +---------+---------------+---------+-----------+----------+--------------+ PERO     Full                                                        +---------+---------------+---------+-----------+----------+--------------+   +---------+---------------+---------+-----------+----------+--------------+ LEFT     CompressibilityPhasicitySpontaneityPropertiesThrombus Aging +---------+---------------+---------+-----------+----------+--------------+ CFV      Full           Yes      Yes                                 +---------+---------------+---------+-----------+----------+--------------+ SFJ      Full                                                        +---------+---------------+---------+-----------+----------+--------------+ FV Prox  Full                                                         +---------+---------------+---------+-----------+----------+--------------+ FV Mid   Full                                                        +---------+---------------+---------+-----------+----------+--------------+ FV DistalFull                                                        +---------+---------------+---------+-----------+----------+--------------+   PFV      Full                                                        +---------+---------------+---------+-----------+----------+--------------+ POP      Full           Yes      Yes                                 +---------+---------------+---------+-----------+----------+--------------+ PTV      Full                                                        +---------+---------------+---------+-----------+----------+--------------+ PERO     Full                                                        +---------+---------------+---------+-----------+----------+--------------+     Summary: RIGHT: - There is no evidence of deep vein thrombosis in the lower extremity.  - No cystic structure found in the popliteal fossa.  LEFT: - There is no evidence of deep vein thrombosis in the lower extremity.  - No cystic structure found in the popliteal fossa.  *See table(s) above for measurements and observations.    Preliminary    ECHOCARDIOGRAM COMPLETE  Result Date: 12/26/2021    ECHOCARDIOGRAM REPORT   Patient Name:   [name] Church Date of Exam: 12/26/2021 Medical Rec #:  [id number]       Height:       64.0 in Accession #:    [id number]      Weight:       188.0 lb Date of Birth:  18-Jan-1935       BSA:          1.906 m Patient Age:    22 years        BP:           141/50 mmHg Patient Gender: F               HR:           66 bpm. Exam Location:  Inpatient Procedure: 2D Echo Indications:    syncope  History:        Patient has prior history of Echocardiogram examinations, most                 recent 06/26/2020. Risk  Factors:Hypertension and Diabetes.  Sonographer:    Harvie Junior Referring Phys: [id number] [place]  1. Left ventricular ejection fraction, by estimation, is 65 to 70%. The left ventricle has normal function. The left ventricle has no regional wall motion abnormalities. Left ventricular diastolic parameters are consistent with Grade I diastolic dysfunction (impaired relaxation).  2. Right ventricular systolic function is normal. The right ventricular size is normal. There is moderately elevated pulmonary artery systolic pressure.  3. The mitral valve is normal in structure. No evidence of mitral valve  regurgitation. No evidence of mitral stenosis.  4. Tricuspid valve regurgitation is mild to moderate.  5. The aortic valve is normal in structure. Aortic valve regurgitation is not visualized. No aortic stenosis is present.  6. The inferior vena cava is normal in size with greater than 50% respiratory variability, suggesting right atrial pressure of 3 mmHg. FINDINGS  Left Ventricle: Left ventricular ejection fraction, by estimation, is 65 to 70%. The left ventricle has normal function. The left ventricle has no regional wall motion abnormalities. The left ventricular internal cavity size was normal in size. There is  no left ventricular hypertrophy. Left ventricular diastolic parameters are consistent with Grade I diastolic dysfunction (impaired relaxation). Right Ventricle: The right ventricular size is normal. No increase in right ventricular wall thickness. Right ventricular systolic function is normal. There is moderately elevated pulmonary artery systolic pressure. The tricuspid regurgitant velocity is 3.16 m/s, and with an assumed right atrial pressure of 8 mmHg, the estimated right ventricular systolic pressure is 83.4 mmHg. Left Atrium: Left atrial size was normal in size. Right Atrium: Right atrial size was normal in size. Pericardium: There is no evidence of pericardial effusion. Mitral Valve:  The mitral valve is normal in structure. No evidence of mitral valve regurgitation. No evidence of mitral valve stenosis. Tricuspid Valve: The tricuspid valve is normal in structure. Tricuspid valve regurgitation is mild to moderate. No evidence of tricuspid stenosis. Aortic Valve: The aortic valve is normal in structure. Aortic valve regurgitation is not visualized. No aortic stenosis is present. Aortic valve mean gradient measures 6.0 mmHg. Aortic valve peak gradient measures 11.0 mmHg. Aortic valve area, by VTI measures 2.37 cm. Pulmonic Valve: The pulmonic valve was normal in structure. Pulmonic valve regurgitation is not visualized. No evidence of pulmonic stenosis. Aorta: The aortic root is normal in size and structure. Venous: The inferior vena cava is normal in size with greater than 50% respiratory variability, suggesting right atrial pressure of 3 mmHg. IAS/Shunts: No atrial level shunt detected by color flow Doppler.  LEFT VENTRICLE PLAX 2D LVIDd:         3.70 cm     Diastology LVIDs:         2.70 cm     LV e' medial:    7.18 cm/s LV PW:         0.70 cm     LV E/e' medial:  14.2 LV IVS:        0.70 cm     LV e' lateral:   7.62 cm/s LVOT diam:     1.90 cm     LV E/e' lateral: 13.4 LV SV:         73 LV SV Index:   38 LVOT Area:     2.84 cm  LV Volumes (MOD) LV vol d, MOD A2C: 77.3 ml LV vol d, MOD A4C: 62.0 ml LV vol s, MOD A2C: 33.9 ml LV vol s, MOD A4C: 22.5 ml LV SV MOD A2C:     43.4 ml LV SV MOD A4C:     62.0 ml LV SV MOD BP:      43.7 ml RIGHT VENTRICLE TAPSE (M-mode): 2.5 cm LEFT ATRIUM           Index LA diam:      2.50 cm 1.31 cm/m LA Vol (A2C): 28.7 ml 15.06 ml/m LA Vol (A4C): 36.3 ml 19.05 ml/m  AORTIC VALVE  PULMONIC VALVE AV Area (Vmax):    2.00 cm      PV Vmax:          1.19 m/s AV Area (Vmean):   2.20 cm      PV Peak grad:     5.7 mmHg AV Area (VTI):     2.37 cm      PR End Diast Vel: 4.16 msec AV Vmax:           166.00 cm/s AV Vmean:          110.000 cm/s AV VTI:             0.309 m AV Peak Grad:      11.0 mmHg AV Mean Grad:      6.0 mmHg LVOT Vmax:         117.00 cm/s LVOT Vmean:        85.500 cm/s LVOT VTI:          0.258 m LVOT/AV VTI ratio: 0.83  AORTA Ao Root diam: 2.80 cm Ao Asc diam:  2.90 cm MITRAL VALVE                TRICUSPID VALVE MV Area (PHT): 3.37 cm     TR Peak grad:   39.9 mmHg MV Decel Time: 225 msec     TR Vmax:        316.00 cm/s MV E velocity: 102.00 cm/s MV A velocity: 119.00 cm/s  SHUNTS MV E/A ratio:  0.86         Systemic VTI:  0.26 m                             Systemic Diam: 1.90 cm Kardie Tobb DO Electronically signed by Berniece Salines DO Signature Date/Time: 12/26/2021/12:50:33 PM    Final    CT Angio Chest PE W and/or Wo Contrast  Result Date: 12/25/2021 CLINICAL DATA:  Denies shortness of breath, no chest pain, elevated D-dimer, hypotension EXAM: CT ANGIOGRAPHY CHEST WITH CONTRAST TECHNIQUE: Multidetector CT imaging of the chest was performed using the standard protocol during bolus administration of intravenous contrast. Multiplanar CT image reconstructions and MIPs were obtained to evaluate the vascular anatomy. RADIATION DOSE REDUCTION: This exam was performed according to the departmental dose-optimization program which includes automated exposure control, adjustment of the mA and/or kV according to patient size and/or use of iterative reconstruction technique. CONTRAST:  30m OMNIPAQUE IOHEXOL 350 MG/ML SOLN COMPARISON:  None Available. FINDINGS: Cardiovascular: Satisfactory opacification of the pulmonary arteries to the segmental level. No evidence of pulmonary embolism. Normal heart size. No pericardial effusion. Thoracic aortic atherosclerosis. Mediastinum/Nodes: No enlarged mediastinal, hilar, or axillary lymph nodes. Thyroid gland, trachea, and esophagus demonstrate no significant findings. Lungs/Pleura: No focal consolidation. Scarring in right upper lobe along the major fissure. No pleural effusion or pneumothorax. Upper Abdomen: No  acute upper abdominal abnormality. Cholelithiasis partially visualized. Musculoskeletal: No acute osseous abnormality. No aggressive osseous lesion. Review of the MIP images confirms the above findings. IMPRESSION: 1. No pulmonary embolism. 2. No acute cardiopulmonary disease. 3. Cholelithiasis. 4.  Aortic Atherosclerosis (ICD10-I70.0). Electronically Signed   By: HKathreen DevoidM.D.   On: 12/25/2021 12:16   DG Chest Portable 1 View  Result Date: 12/25/2021 CLINICAL DATA:  Provided history: Near syncope. Additional history provided: Hypotension. Dizziness. Weakness. EXAM: PORTABLE CHEST 1 VIEW COMPARISON:  Prior chest radiographs 07/12/2020 and earlier. FINDINGS: Heart size within normal limits. Mild ill-defined opacity within  medial left lung base, most compatible with atelectasis. No appreciable airspace consolidation on the right. No evidence of pleural effusion or pneumothorax. No acute bony abnormality identified. IMPRESSION: Mild left basilar atelectasis. Otherwise, no evidence of acute cardiopulmonary abnormality. Electronically Signed   By: Kellie Simmering D.O.   On: 12/25/2021 10:36      LOS: 0 days    Flora Lipps, MD [facility] Available via Epic secure chat 7am-7pm After these hours, please refer to coverage provider listed on [email or web address] 12/26/2021, 4:11 PM

## 2021-12-26 NOTE — Progress Notes (Signed)
Bilateral lower extremity venous duplex completed. Refer to "CV Proc" under chart review to view preliminary results.  12/26/2021 2:48 PM Kelby Aline., MHA, RVT, RDCS, RDMS

## 2021-12-26 NOTE — Discharge Summary (Signed)
Physician Discharge Summary  MARIADEL MRUK AVW:[id number] DOB: 10-02-1934 DOA: 12/25/2021  PCP: Jonathon Jordan, MD  Admit date: 12/25/2021 Discharge date: 12/27/2021  Admitted From: Home  Discharge disposition: Home    Recommendations for Outpatient Follow-Up:   Follow up with your primary care provider in one week.  Check CBC, BMP, magnesium in the next visit Follow up with your cardiologist as scheduled.   Discharge Diagnosis:   Active Problems:   Macrocytic anemia   CKD (chronic kidney disease) stage 3, GFR 30-59 ml/min (HCC)   Primary hypothyroidism   DM type 1 (diabetes mellitus, type 1) ([place])   Essential hypertension   PVC's (premature ventricular contractions)   Glaucoma  Resolved syncope   Discharge Condition: Improved.  Diet recommendation:  Carbohydrate-modified.   Wound care: None.  Code status: Full.   History of Present Illness:   [name] Church is a 87 y.o. provide with past medical history of diabetes mellitus, hypertension, glaucoma presented to hospital with generalized weakness dizziness lightheadedness when trying to get up from sitting down position.  Initial vitals in the ED was within normal range.  Labs showed hemoglobin of 11.1.  Creatinine of 1.4.  D-dimer was elevated at 1.2.  Urinalysis with leukocytes and 6-10 white cells.  Patient was afebrile.  Patient was then admitted to hospital for further evaluation and treatment.   Hospital Course:   Following conditions were addressed during hospitalization as listed below,  Near syncope Patient had positive orthostatics which has improved after IV fluid hydration..  CTA of the chest with no pulmonary embolism.  No signs of infection.  Patient will be discontinued on HCTZ on discharge.  Lisinopril has been changed to bedtime at this time.  Patient was advised to take orthostatic precautions on discharge.  Elevated d-dimer CTA of the chest is negative.  Duplex ultrasound of the lower  extremity was negative for DVT.  Hx of HTN Hypotension Patient had orthostatic hypotension.  HCTZ will be discontinued.  Orthostatic precautions were advised including elastic stockings.   PVCs Follows up with the cardiology as outpatient.  Continue aspirin 325 mg, metoprolol   Insulin-dependent diabetes mellitus with hyperglycemia. Continue insulin from home.  Continue diabetic diet, hemoglobin A1c of 7.1.     CKD3b Creatinine on presentation at 1.4.  Latest creatinine at 1.3.   Hypothyroidism Continue Synthroid   Glaucoma Continue Alphagan and Cosopt.   Macrocytic anemia At baseline.    Disposition.  At this time, patient is stable for disposition home with outpatient PCP and cardiology follow-up.  Medical Consultants:   None.  Procedures:    None Subjective:   Today, patient was seen and examined bedside.  Denies any dizziness, lightheadedness, shortness of breath chest pain palpitation.  Blood pressure has improved at this time   Discharge Exam:   Vitals:   12/27/21 0611 12/27/21 0926  BP: (!) 143/46 (!) 139/44  Pulse: (!) 42 85  Resp: 18   Temp: 98 F (36.7 C)   SpO2: 99%    Vitals:   12/26/21 2101 12/27/21 0134 12/27/21 0611 12/27/21 0926  BP: (!) 148/55 (!) 138/59 (!) 143/46 (!) 139/44  Pulse: 79 79 (!) 42 85  Resp: '19 18 18   '$ Temp: 98.4 F (36.9 C) 97.9 F (36.6 C) 98 F (36.7 C)   TempSrc: Oral Oral Oral   SpO2: 95% 96% 99%   Weight:   89.2 kg   Height:       General: Alert awake, not in obvious  distress, elderly female HENT: pupils equally reacting to light,  No scleral pallor or icterus noted. Oral mucosa is moist.  Chest:  Clear breath sounds.  Diminished breath sounds bilaterally. No crackles or wheezes.  CVS: S1 &S2 heard. No murmur.  Regular rate and rhythm. Abdomen: Soft, nontender, nondistended.  Bowel sounds are heard.   Extremities: No cyanosis, clubbing or edema.  Peripheral pulses are palpable. Psych: Alert, awake and oriented,  normal mood CNS:  No cranial nerve deficits.  Power equal in all extremities.   Skin: Warm and dry.  No rashes noted.  The results of significant diagnostics from this hospitalization (including imaging, microbiology, ancillary and laboratory) are listed below for reference.     Diagnostic Studies:   ECHOCARDIOGRAM COMPLETE  Result Date: 12/26/2021    ECHOCARDIOGRAM REPORT   Patient Name:   [name] Church Date of Exam: 12/26/2021 Medical Rec #:  [id number]       Height:       64.0 in Accession #:    [id number]      Weight:       188.0 lb Date of Birth:  1934/07/21       BSA:          1.906 m Patient Age:    18 years        BP:           141/50 mmHg Patient Gender: F               HR:           66 bpm. Exam Location:  Inpatient Procedure: 2D Echo Indications:    syncope  History:        Patient has prior history of Echocardiogram examinations, most                 recent 06/26/2020. Risk Factors:Hypertension and Diabetes.  Sonographer:    Harvie Junior Referring Phys: [id number] [place]  1. Left ventricular ejection fraction, by estimation, is 65 to 70%. The left ventricle has normal function. The left ventricle has no regional wall motion abnormalities. Left ventricular diastolic parameters are consistent with Grade I diastolic dysfunction (impaired relaxation).  2. Right ventricular systolic function is normal. The right ventricular size is normal. There is moderately elevated pulmonary artery systolic pressure.  3. The mitral valve is normal in structure. No evidence of mitral valve regurgitation. No evidence of mitral stenosis.  4. Tricuspid valve regurgitation is mild to moderate.  5. The aortic valve is normal in structure. Aortic valve regurgitation is not visualized. No aortic stenosis is present.  6. The inferior vena cava is normal in size with greater than 50% respiratory variability, suggesting right atrial pressure of 3 mmHg. FINDINGS  Left Ventricle: Left ventricular ejection  fraction, by estimation, is 65 to 70%. The left ventricle has normal function. The left ventricle has no regional wall motion abnormalities. The left ventricular internal cavity size was normal in size. There is  no left ventricular hypertrophy. Left ventricular diastolic parameters are consistent with Grade I diastolic dysfunction (impaired relaxation). Right Ventricle: The right ventricular size is normal. No increase in right ventricular wall thickness. Right ventricular systolic function is normal. There is moderately elevated pulmonary artery systolic pressure. The tricuspid regurgitant velocity is 3.16 m/s, and with an assumed right atrial pressure of 8 mmHg, the estimated right ventricular systolic pressure is 28.3 mmHg. Left Atrium: Left atrial size was normal in size. Right Atrium: Right atrial size was  normal in size. Pericardium: There is no evidence of pericardial effusion. Mitral Valve: The mitral valve is normal in structure. No evidence of mitral valve regurgitation. No evidence of mitral valve stenosis. Tricuspid Valve: The tricuspid valve is normal in structure. Tricuspid valve regurgitation is mild to moderate. No evidence of tricuspid stenosis. Aortic Valve: The aortic valve is normal in structure. Aortic valve regurgitation is not visualized. No aortic stenosis is present. Aortic valve mean gradient measures 6.0 mmHg. Aortic valve peak gradient measures 11.0 mmHg. Aortic valve area, by VTI measures 2.37 cm. Pulmonic Valve: The pulmonic valve was normal in structure. Pulmonic valve regurgitation is not visualized. No evidence of pulmonic stenosis. Aorta: The aortic root is normal in size and structure. Venous: The inferior vena cava is normal in size with greater than 50% respiratory variability, suggesting right atrial pressure of 3 mmHg. IAS/Shunts: No atrial level shunt detected by color flow Doppler.  LEFT VENTRICLE PLAX 2D LVIDd:         3.70 cm     Diastology LVIDs:         2.70 cm     LV  e' medial:    7.18 cm/s LV PW:         0.70 cm     LV E/e' medial:  14.2 LV IVS:        0.70 cm     LV e' lateral:   7.62 cm/s LVOT diam:     1.90 cm     LV E/e' lateral: 13.4 LV SV:         73 LV SV Index:   38 LVOT Area:     2.84 cm  LV Volumes (MOD) LV vol d, MOD A2C: 77.3 ml LV vol d, MOD A4C: 62.0 ml LV vol s, MOD A2C: 33.9 ml LV vol s, MOD A4C: 22.5 ml LV SV MOD A2C:     43.4 ml LV SV MOD A4C:     62.0 ml LV SV MOD BP:      43.7 ml RIGHT VENTRICLE TAPSE (M-mode): 2.5 cm LEFT ATRIUM           Index LA diam:      2.50 cm 1.31 cm/m LA Vol (A2C): 28.7 ml 15.06 ml/m LA Vol (A4C): 36.3 ml 19.05 ml/m  AORTIC VALVE                     PULMONIC VALVE AV Area (Vmax):    2.00 cm      PV Vmax:          1.19 m/s AV Area (Vmean):   2.20 cm      PV Peak grad:     5.7 mmHg AV Area (VTI):     2.37 cm      PR End Diast Vel: 4.16 msec AV Vmax:           166.00 cm/s AV Vmean:          110.000 cm/s AV VTI:            0.309 m AV Peak Grad:      11.0 mmHg AV Mean Grad:      6.0 mmHg LVOT Vmax:         117.00 cm/s LVOT Vmean:        85.500 cm/s LVOT VTI:          0.258 m LVOT/AV VTI ratio: 0.83  AORTA Ao Root diam: 2.80 cm Ao Asc diam:  2.90 cm  MITRAL VALVE                TRICUSPID VALVE MV Area (PHT): 3.37 cm     TR Peak grad:   39.9 mmHg MV Decel Time: 225 msec     TR Vmax:        316.00 cm/s MV E velocity: 102.00 cm/s MV A velocity: 119.00 cm/s  SHUNTS MV E/A ratio:  0.86         Systemic VTI:  0.26 m                             Systemic Diam: 1.90 cm Kardie Tobb DO Electronically signed by Berniece Salines DO Signature Date/Time: 12/26/2021/12:50:33 PM    Final    CT Angio Chest PE W and/or Wo Contrast  Result Date: 12/25/2021 CLINICAL DATA:  Denies shortness of breath, no chest pain, elevated D-dimer, hypotension EXAM: CT ANGIOGRAPHY CHEST WITH CONTRAST TECHNIQUE: Multidetector CT imaging of the chest was performed using the standard protocol during bolus administration of intravenous contrast. Multiplanar CT image  reconstructions and MIPs were obtained to evaluate the vascular anatomy. RADIATION DOSE REDUCTION: This exam was performed according to the departmental dose-optimization program which includes automated exposure control, adjustment of the mA and/or kV according to patient size and/or use of iterative reconstruction technique. CONTRAST:  61m OMNIPAQUE IOHEXOL 350 MG/ML SOLN COMPARISON:  None Available. FINDINGS: Cardiovascular: Satisfactory opacification of the pulmonary arteries to the segmental level. No evidence of pulmonary embolism. Normal heart size. No pericardial effusion. Thoracic aortic atherosclerosis. Mediastinum/Nodes: No enlarged mediastinal, hilar, or axillary lymph nodes. Thyroid gland, trachea, and esophagus demonstrate no significant findings. Lungs/Pleura: No focal consolidation. Scarring in right upper lobe along the major fissure. No pleural effusion or pneumothorax. Upper Abdomen: No acute upper abdominal abnormality. Cholelithiasis partially visualized. Musculoskeletal: No acute osseous abnormality. No aggressive osseous lesion. Review of the MIP images confirms the above findings. IMPRESSION: 1. No pulmonary embolism. 2. No acute cardiopulmonary disease. 3. Cholelithiasis. 4.  Aortic Atherosclerosis (ICD10-I70.0). Electronically Signed   By: HKathreen DevoidM.D.   On: 12/25/2021 12:16   DG Chest Portable 1 View  Result Date: 12/25/2021 CLINICAL DATA:  Provided history: Near syncope. Additional history provided: Hypotension. Dizziness. Weakness. EXAM: PORTABLE CHEST 1 VIEW COMPARISON:  Prior chest radiographs 07/12/2020 and earlier. FINDINGS: Heart size within normal limits. Mild ill-defined opacity within medial left lung base, most compatible with atelectasis. No appreciable airspace consolidation on the right. No evidence of pleural effusion or pneumothorax. No acute bony abnormality identified. IMPRESSION: Mild left basilar atelectasis. Otherwise, no evidence of acute cardiopulmonary  abnormality. Electronically Signed   By: KKellie SimmeringD.O.   On: 12/25/2021 10:36     Labs:   Basic Metabolic Panel: Recent Labs  Lab 12/25/21 1000 12/26/21 0432  NA 140 140  K 4.1 4.2  CL 108 110  CO2 25 25  GLUCOSE 152* 359*  BUN 40* 37*  CREATININE 1.48* 1.37*  CALCIUM 8.7* 8.6*  MG 1.9  --    GFR Estimated Creatinine Clearance: 33.1 mL/min (A) (by C-G formula based on SCr of 1.37 mg/dL (H)). Liver Function Tests: Recent Labs  Lab 12/25/21 1227 12/26/21 0432  AST 26 27  ALT 22 20  ALKPHOS 42 39  BILITOT 0.6 0.7  PROT 6.2* 5.6*  ALBUMIN 3.1* 2.7*   No results for input(s): "LIPASE", "AMYLASE" in the last 168 hours. No results for input(s): "AMMONIA" in the last  168 hours. Coagulation profile No results for input(s): "INR", "PROTIME" in the last 168 hours.  CBC: Recent Labs  Lab 12/25/21 1000 12/26/21 0432  WBC 7.6 6.3  HGB 11.1* 10.1*  HCT 34.2* 29.9*  MCV 102.7* 101.7*  PLT 216 194   Cardiac Enzymes: No results for input(s): "CKTOTAL", "CKMB", "CKMBINDEX", "TROPONINI" in the last 168 hours. BNP: Invalid input(s): "POCBNP" CBG: Recent Labs  Lab 12/26/21 0638 12/26/21 1158 12/26/21 1727 12/26/21 2100 12/27/21 0754  GLUCAP 370* 176* 174* 202* 217*   D-Dimer Recent Labs    12/25/21 1000  DDIMER 1.23*   Hgb A1c Recent Labs    12/25/21 2224  HGBA1C 7.1*   Lipid Profile No results for input(s): "CHOL", "HDL", "LDLCALC", "TRIG", "CHOLHDL", "LDLDIRECT" in the last 72 hours. Thyroid function studies No results for input(s): "TSH", "T4TOTAL", "T3FREE", "THYROIDAB" in the last 72 hours.  Invalid input(s): "FREET3" Anemia work up No results for input(s): "VITAMINB12", "FOLATE", "FERRITIN", "TIBC", "IRON", "RETICCTPCT" in the last 72 hours. Microbiology No results found for this or any previous visit (from the past 240 hour(s)).   Discharge Instructions:   Discharge Instructions     Diet Carb Modified   Complete by: As directed     Discharge instructions   Complete by: As directed    Follow-up with your primary care provider in 1 week.  Stop taking hydrochlorothiazide and take lisinopril at nighttime.  Take Time to positions especially from lying down to sitting up/standing.  Wearing elastic stockings.  Increase hydration.   Increase activity slowly   Complete by: As directed       Allergies as of 12/27/2021       Reactions   Mexiletine    Other reaction(s): Other (See Comments) Off balance    Sulfa Antibiotics    Sulfamethoxazole Rash        Medication List     STOP taking these medications    hydrochlorothiazide 12.5 MG capsule Commonly known as: MICROZIDE       TAKE these medications    Accu-Chek Aviva Plus test strip Generic drug: glucose blood USE TO TEST BLOOD SUGARS QID   Accu-Chek FastClix Lancets Misc   acetaminophen 500 MG tablet Commonly known as: TYLENOL Take 500-1,000 mg by mouth every 6 (six) hours as needed for mild pain.   aspirin EC 325 MG tablet Take 325 mg by mouth daily.   BD Pen Needle Nano U/F 32G X 4 MM Misc Generic drug: Insulin Pen Needle USE AS DIRECTED 4-5 TIMES A DAY   brimonidine 0.2 % ophthalmic solution Commonly known as: ALPHAGAN Place 1 drop into both eyes 2 (two) times daily.   CALCIUM & VIT D3 BONE HEALTH PO Take 1 tablet by mouth daily.   Cholecalciferol 25 MCG (1000 UT) tablet Take 1,000 Units by mouth daily.   [employer] Lancing Device Misc   Dexcom G6 Receiver Devi Use as directed for continuous glucose monitoring.   Dexcom G6 Sensor Misc SMARTSIG:1 Topical Every 10 Days   Dexcom G6 Sensor Misc Inject 1 sensor to the skin every 10 days for continuous glucose monitoring.   Dexcom G6 Transmitter Misc   Dexcom G6 Transmitter Misc Use as directed for continuous glucose monitoring. Reuse transmitter for 90 days then discard and replace.   dorzolamide-timolol 2-0.5 % ophthalmic solution Commonly known as: COSOPT Place 1 drop into both eyes 2  (two) times daily.   ferrous sulfate 325 (65 FE) MG tablet Take 325 mg by mouth daily with breakfast.  HumaLOG KwikPen 100 UNIT/ML KwikPen Generic drug: insulin lispro 2-6 Units 3 (three) times daily before meals.   levothyroxine 125 MCG tablet Commonly known as: SYNTHROID Take 125 mcg by mouth daily.   lisinopril 5 MG tablet Commonly known as: ZESTRIL Take 1 tablet (5 mg total) by mouth at bedtime. What changed: when to take this   loratadine 10 MG tablet Commonly known as: CLARITIN Take 10 mg by mouth daily as needed for allergies.   metoprolol succinate 25 MG 24 hr tablet Commonly known as: TOPROL-XL Take 1 tablet (25 mg total) by mouth daily.   Multi-Vitamins Tabs Take 1 tablet once a day   Rocklatan 0.02-0.005 % Soln Generic drug: Netarsudil-Latanoprost Place 1 drop into both eyes at bedtime.   Tyler Aas FlexTouch 200 UNIT/ML FlexTouch Pen Generic drug: insulin degludec Inject 14 Units into the skin daily.          Time coordinating discharge: 39 minutes  Signed:  Kadasia Kassing  [facility] 12/27/2021, 4:10 PM

## 2021-12-27 DIAGNOSIS — I959 Hypotension, unspecified: Secondary | ICD-10-CM

## 2021-12-27 DIAGNOSIS — E109 Type 1 diabetes mellitus without complications: Secondary | ICD-10-CM | POA: Diagnosis not present

## 2021-12-27 DIAGNOSIS — H409 Unspecified glaucoma: Secondary | ICD-10-CM | POA: Diagnosis not present

## 2021-12-27 DIAGNOSIS — N183 Chronic kidney disease, stage 3 unspecified: Secondary | ICD-10-CM | POA: Diagnosis not present

## 2021-12-27 DIAGNOSIS — R7989 Other specified abnormal findings of blood chemistry: Secondary | ICD-10-CM | POA: Diagnosis not present

## 2021-12-27 DIAGNOSIS — E039 Hypothyroidism, unspecified: Secondary | ICD-10-CM | POA: Diagnosis not present

## 2021-12-27 DIAGNOSIS — R55 Syncope and collapse: Secondary | ICD-10-CM | POA: Diagnosis not present

## 2021-12-27 DIAGNOSIS — I493 Ventricular premature depolarization: Secondary | ICD-10-CM | POA: Diagnosis not present

## 2021-12-27 DIAGNOSIS — I10 Essential (primary) hypertension: Secondary | ICD-10-CM | POA: Diagnosis not present

## 2021-12-27 DIAGNOSIS — D539 Nutritional anemia, unspecified: Secondary | ICD-10-CM | POA: Diagnosis not present

## 2021-12-27 LAB — GLUCOSE, CAPILLARY: Glucose-Capillary: 217 mg/dL — ABNORMAL HIGH (ref 70–99)

## 2021-12-27 MED ORDER — LISINOPRIL 5 MG PO TABS: 5.0000 mg | ORAL_TABLET | Freq: Every day | ORAL | Status: AC

## 2021-12-27 NOTE — Plan of Care (Signed)
  Problem: Education: Goal: Ability to describe self-care measures that may prevent or decrease complications (Diabetes Survival Skills Education) will improve Outcome: Adequate for Discharge Goal: Individualized Educational Video(s) Outcome: Adequate for Discharge   Problem: Coping: Goal: Ability to adjust to condition or change in health will improve Outcome: Adequate for Discharge   Problem: Fluid Volume: Goal: Ability to maintain a balanced intake and output will improve Outcome: Adequate for Discharge   Problem: Health Behavior/Discharge Planning: Goal: Ability to identify and utilize available resources and services will improve Outcome: Adequate for Discharge Goal: Ability to manage health-related needs will improve Outcome: Adequate for Discharge   Problem: Metabolic: Goal: Ability to maintain appropriate glucose levels will improve Outcome: Adequate for Discharge   Problem: Nutritional: Goal: Maintenance of adequate nutrition will improve Outcome: Adequate for Discharge Goal: Progress toward achieving an optimal weight will improve Outcome: Adequate for Discharge   Problem: Skin Integrity: Goal: Risk for impaired skin integrity will decrease Outcome: Adequate for Discharge   Problem: Tissue Perfusion: Goal: Adequacy of tissue perfusion will improve Outcome: Adequate for Discharge   Problem: Education: Goal: Knowledge of condition and prescribed therapy will improve Outcome: Adequate for Discharge   Problem: Cardiac: Goal: Will achieve and/or maintain adequate cardiac output Outcome: Adequate for Discharge   Problem: Physical Regulation: Goal: Complications related to the disease process, condition or treatment will be avoided or minimized Outcome: Adequate for Discharge   Problem: Education: Goal: Knowledge of General Education information will improve Description: Including pain rating scale, medication(s)/side effects and non-pharmacologic comfort  measures Outcome: Adequate for Discharge   Problem: Health Behavior/Discharge Planning: Goal: Ability to manage health-related needs will improve Outcome: Adequate for Discharge   Problem: Clinical Measurements: Goal: Ability to maintain clinical measurements within normal limits will improve Outcome: Adequate for Discharge Goal: Will remain free from infection Outcome: Adequate for Discharge Goal: Diagnostic test results will improve Outcome: Adequate for Discharge Goal: Respiratory complications will improve Outcome: Adequate for Discharge Goal: Cardiovascular complication will be avoided Outcome: Adequate for Discharge   Problem: Activity: Goal: Risk for activity intolerance will decrease Outcome: Adequate for Discharge   Problem: Nutrition: Goal: Adequate nutrition will be maintained Outcome: Adequate for Discharge   Problem: Coping: Goal: Level of anxiety will decrease Outcome: Adequate for Discharge   Problem: Elimination: Goal: Will not experience complications related to bowel motility Outcome: Adequate for Discharge Goal: Will not experience complications related to urinary retention Outcome: Adequate for Discharge   Problem: Pain Managment: Goal: General experience of comfort will improve Outcome: Adequate for Discharge   Problem: Safety: Goal: Ability to remain free from injury will improve Outcome: Adequate for Discharge   Problem: Skin Integrity: Goal: Risk for impaired skin integrity will decrease Outcome: Adequate for Discharge

## 2021-12-27 NOTE — Progress Notes (Signed)
Physical Therapy Treatment Patient Details Name: [name] Church MRN: [id number] DOB: 1934/07/30 Today's Date: 12/27/2021   History of Present Illness  87 y.o. female with medical history significant of DM, HTN, glaucoma. Presenting with weakness. She reports that she was in her normal state of health until this morning. As she was trying to get up from the breakfast table, she became lightheaded and dizzy. She felt her vision changing. So, she sat down and laid her head down. She called EMS for help. She didn't have any palpitations, chest pain or dyspnea. She didn't have an N/V. She denies any recent sickness or sick contacts. She denies any other aggravating or alleviating factors.     PT Comments    Patient making excellent progress with mobility. Min guard today for transfers and gait and no LOB noted. Pt progressed to step through pattern with good stride length and no shuffled steps. BP stable at end of gait and pt denied dizziness or symptoms of hypotension. She is mobilizing at safe level to return home. Recommend ST rehab with HHPT. Will continue to progress in acute setting.    Recommendations for follow up therapy are one component of a multi-disciplinary discharge planning process, led by the attending physician.  Recommendations may be updated based on patient status, additional functional criteria and insurance authorization.  PT Recommendation   Follow Up Recommendations Home health PT Filed 12/27/2021 6644  Assistance recommended at discharge Frequent or constant Supervision/Assistance Filed 12/27/2021 0347  Patient can return home with the following A little help with walking and/or transfers, A little help with bathing/dressing/bathroom, Assistance with cooking/housework, Direct supervision/assist for medications management, Assist for transportation, Help with stairs or ramp for entrance [facility] 12/27/2021 0925  Functional Status Assessment Patient has had a recent decline in  their functional status and demonstrates the ability to make significant improvements in function in a reasonable and predictable amount of time. Filed 12/26/2021 1510  PT equipment None recommended by PT [facility] 12/27/2021 0925   Mobility  Bed Mobility Overal bed mobility: Needs Assistance Bed Mobility: Supine to Sit     Supine to sit: Min guard, HOB elevated     General bed mobility comments: extra time    Transfers Overall transfer level: Needs assistance Equipment used: Rolling walker (2 wheels) Transfers: Sit to/from Stand Sit to Stand: Min guard           General transfer comment: guarding for safety, pt able to power up to RW from EOB with bil UE use.    Ambulation/Gait Ambulation/Gait assistance: Min guard Gait Distance (Feet): 110 Feet Assistive device: Rolling walker (2 wheels) Gait Pattern/deviations: Step-to pattern, Decreased stride length, Shuffle Gait velocity: decr     General Gait Details: pt progressed from step to pattern to step through. no LOB noted and pt maintained safe proximity to RW. BP assessed at EOS after gait and normal. pt denied symptoms/dizziness throughout.   Stairs             Wheelchair Mobility    Modified Rankin (Stroke Patients Only)       Balance Overall balance assessment: Needs assistance Sitting-balance support: Feet supported Sitting balance-Leahy Scale: Good     Standing balance support: During functional activity, Bilateral upper extremity supported, Reliant on assistive device for balance Standing balance-Leahy Scale: Fair                              Cognition Arousal/Alertness: Awake/alert  Behavior During Therapy: [facility] for tasks assessed/performed Overall Cognitive Status: Within Functional Limits for tasks assessed                                            12/27/21 0925  PT - End of Session  Equipment Utilized During Treatment Gait belt  Activity Tolerance Patient  tolerated treatment well  Patient left in chair;with call bell/phone within reach;with chair alarm set  Nurse Communication Mobility status   PT - Assessment/Plan  PT Visit Diagnosis Unsteadiness on feet (R26.81);Muscle weakness (generalized) (M62.81);Difficulty in walking, not elsewhere classified (R26.2)  PT Frequency (ACUTE ONLY) Min 3X/week  Follow Up Recommendations Home health PT  Assistance recommended at discharge Frequent or constant Supervision/Assistance  Patient can return home with the following A little help with walking and/or transfers;A little help with bathing/dressing/bathroom;Assistance with cooking/housework;Direct supervision/assist for medications management;Assist for transportation;Help with stairs or ramp for entrance  PT equipment None recommended by PT  AM-PAC PT "6 Clicks" Mobility Outcome Measure (Version 2)  Help needed turning from your back to your side while in a flat bed without using bedrails? 3  Help needed moving from lying on your back to sitting on the side of a flat bed without using bedrails? 3  Help needed moving to and from a bed to a chair (including a wheelchair)? 3  Help needed standing up from a chair using your arms (e.g., wheelchair or bedside chair)? 3  Help needed to walk in hospital room? 3  Help needed climbing 3-5 steps with a railing?  2  6 Click Score 17  Consider Recommendation of Discharge To: Home with [facility]  Progressive Mobility  What is the highest level of mobility based on the progressive mobility assessment? Level 4 (Walks with assist in room) - Balance while marching in place and cannot step forward and back - Complete  Mobility Referral No  Activity Ambulated with assistance in hallway  PT Goal Progression  Progress towards PT goals Progressing toward goals  Acute Rehab PT Goals  PT Goal Formulation With patient  Time For Goal Achievement 01/09/22  Potential to Achieve Goals Good  PT Time Calculation  PT Start Time (ACUTE  ONLY) 0909  PT Stop Time (ACUTE ONLY) 0931  PT Time Calculation (min) (ACUTE ONLY) 22 min  PT General Charges  $$ ACUTE PT VISIT 1 Visit  PT Treatments  $Gait Training 8-22 mins     Verner Mould, DPT Acute Rehabilitation Services Office [phone]  12/27/21 5:22 PM

## 2022-01-22 DIAGNOSIS — H401133 Primary open-angle glaucoma, bilateral, severe stage: Secondary | ICD-10-CM | POA: Diagnosis not present

## 2022-01-22 DIAGNOSIS — H3589 Other specified retinal disorders: Secondary | ICD-10-CM | POA: Diagnosis not present

## 2022-01-22 DIAGNOSIS — Z794 Long term (current) use of insulin: Secondary | ICD-10-CM | POA: Diagnosis not present

## 2022-01-22 DIAGNOSIS — E113553 Type 2 diabetes mellitus with stable proliferative diabetic retinopathy, bilateral: Secondary | ICD-10-CM | POA: Diagnosis not present

## 2022-01-22 DIAGNOSIS — Z961 Presence of intraocular lens: Secondary | ICD-10-CM | POA: Diagnosis not present

## 2022-01-22 DIAGNOSIS — T8522XA Displacement of intraocular lens, initial encounter: Secondary | ICD-10-CM | POA: Diagnosis not present

## 2022-02-06 DIAGNOSIS — T8522XA Displacement of intraocular lens, initial encounter: Secondary | ICD-10-CM | POA: Diagnosis not present

## 2022-02-06 DIAGNOSIS — Z794 Long term (current) use of insulin: Secondary | ICD-10-CM | POA: Diagnosis not present

## 2022-02-06 DIAGNOSIS — H2710 Unspecified dislocation of lens: Secondary | ICD-10-CM | POA: Diagnosis not present

## 2022-02-06 DIAGNOSIS — H401133 Primary open-angle glaucoma, bilateral, severe stage: Secondary | ICD-10-CM | POA: Diagnosis not present

## 2022-02-06 DIAGNOSIS — E113553 Type 2 diabetes mellitus with stable proliferative diabetic retinopathy, bilateral: Secondary | ICD-10-CM | POA: Diagnosis not present

## 2022-02-11 DIAGNOSIS — T8522XA Displacement of intraocular lens, initial encounter: Secondary | ICD-10-CM | POA: Diagnosis not present

## 2022-02-11 DIAGNOSIS — Z01818 Encounter for other preprocedural examination: Secondary | ICD-10-CM | POA: Diagnosis not present

## 2022-02-11 DIAGNOSIS — H2710 Unspecified dislocation of lens: Secondary | ICD-10-CM | POA: Diagnosis not present

## 2022-02-12 ENCOUNTER — Telehealth: Payer: Self-pay | Admitting: Internal Medicine

## 2022-02-12 NOTE — Telephone Encounter (Signed)
Called patient back to follow up on [facility] Triage call.    Pt did not answer, I left message for her to call me back, to reassess her current HR after holding Toprol XL.    Waiting on Pt to call [facility].

## 2022-02-12 NOTE — Telephone Encounter (Signed)
Call in to triage for low heart rate yesterday,  was at eye dr for lens measurements.  She has a lens replacement surgery next week.  They checked BP - heart rate was 45.  They told her to monitor and call Dr. Lovena Le to see if dose should be adjusted.  She sees Dr. Lovena Le for PVSs.  This morning her HR is 37.  I asked her to hold Toprol XL 25 mg this am and to wait to hear from [place] for further instruction.  She is checking w wrist cuff.  She denies fatigue, lightheadedness, dizziness.  Feels her usual self.  We discussed how that cuff reading could be off due to PVCs.    Pt will await call back about what to do w medication tomorrow.  I adv if HR up to 60-70 and above tomorrow to go ahead and take the dose.

## 2022-02-12 NOTE — Telephone Encounter (Signed)
Pt returning call

## 2022-02-12 NOTE — Telephone Encounter (Signed)
Pt called back to reassess HR, per Michalene RN note.    Pt stated she has had no cardiac symptoms today.  She checked her HR at 100 pm today, 02/12/22 and it was 48. Pt also stated BP was 143/69. Pt told to not take Toprol XL I will consult Dr. Lovena Le in clinic 02/14/22 to discuss her HR and what options Dr. Lovena Le suggests.   Follow up still required.   Pt advised to contact [facility] if her HR drops lower, or she becomes symptomatic.

## 2022-02-12 NOTE — Telephone Encounter (Signed)
Pt c/o medication issue:  1. Name of Medication: Metoprolol  2. How are you currently taking this medication (dosage and times per day)? 1 time a day  3. Are you having a reaction (difficulty breathing--STAT)?   4. What is your medication issue? Pulse rate is running low, yesterday it was 41, today its 35     STAT if HR is under 50 or over 120 (normal HR is 60-100 beats per minute)  What is your heart rate? 35  Do you have a log of your heart rate readings (document readings)?   Do you have any other symptoms? no

## 2022-03-10 DIAGNOSIS — E109 Type 1 diabetes mellitus without complications: Secondary | ICD-10-CM | POA: Diagnosis not present

## 2022-03-10 DIAGNOSIS — H26412 Soemmering's ring, left eye: Secondary | ICD-10-CM | POA: Diagnosis not present

## 2022-03-10 DIAGNOSIS — R001 Bradycardia, unspecified: Secondary | ICD-10-CM | POA: Diagnosis not present

## 2022-03-10 DIAGNOSIS — Z794 Long term (current) use of insulin: Secondary | ICD-10-CM | POA: Diagnosis not present

## 2022-03-10 DIAGNOSIS — T8522XA Displacement of intraocular lens, initial encounter: Secondary | ICD-10-CM | POA: Diagnosis not present

## 2022-03-14 ENCOUNTER — Ambulatory Visit: Payer: Medicare Other | Admitting: Podiatry

## 2022-03-20 DIAGNOSIS — T8522XA Displacement of intraocular lens, initial encounter: Secondary | ICD-10-CM | POA: Diagnosis not present

## 2022-03-27 DIAGNOSIS — E113553 Type 2 diabetes mellitus with stable proliferative diabetic retinopathy, bilateral: Secondary | ICD-10-CM | POA: Diagnosis not present

## 2022-05-22 DIAGNOSIS — T8522XA Displacement of intraocular lens, initial encounter: Secondary | ICD-10-CM | POA: Diagnosis not present

## 2022-05-22 DIAGNOSIS — H401133 Primary open-angle glaucoma, bilateral, severe stage: Secondary | ICD-10-CM | POA: Diagnosis not present

## 2022-05-22 DIAGNOSIS — E113553 Type 2 diabetes mellitus with stable proliferative diabetic retinopathy, bilateral: Secondary | ICD-10-CM | POA: Diagnosis not present

## 2022-05-22 DIAGNOSIS — Z961 Presence of intraocular lens: Secondary | ICD-10-CM | POA: Diagnosis not present

## 2022-06-12 DIAGNOSIS — H401133 Primary open-angle glaucoma, bilateral, severe stage: Secondary | ICD-10-CM | POA: Diagnosis not present

## 2022-06-12 DIAGNOSIS — T8522XA Displacement of intraocular lens, initial encounter: Secondary | ICD-10-CM | POA: Diagnosis not present

## 2022-06-12 DIAGNOSIS — E113553 Type 2 diabetes mellitus with stable proliferative diabetic retinopathy, bilateral: Secondary | ICD-10-CM | POA: Diagnosis not present

## 2022-06-18 DIAGNOSIS — Z961 Presence of intraocular lens: Secondary | ICD-10-CM | POA: Diagnosis not present

## 2022-06-18 DIAGNOSIS — T8522XA Displacement of intraocular lens, initial encounter: Secondary | ICD-10-CM | POA: Diagnosis not present

## 2022-06-18 DIAGNOSIS — H401133 Primary open-angle glaucoma, bilateral, severe stage: Secondary | ICD-10-CM | POA: Diagnosis not present

## 2022-06-18 DIAGNOSIS — E113553 Type 2 diabetes mellitus with stable proliferative diabetic retinopathy, bilateral: Secondary | ICD-10-CM | POA: Diagnosis not present

## 2022-06-20 DIAGNOSIS — E785 Hyperlipidemia, unspecified: Secondary | ICD-10-CM | POA: Diagnosis not present

## 2022-06-20 DIAGNOSIS — E1065 Type 1 diabetes mellitus with hyperglycemia: Secondary | ICD-10-CM | POA: Diagnosis not present

## 2022-06-20 DIAGNOSIS — E1069 Type 1 diabetes mellitus with other specified complication: Secondary | ICD-10-CM | POA: Diagnosis not present

## 2022-06-20 DIAGNOSIS — E103293 Type 1 diabetes mellitus with mild nonproliferative diabetic retinopathy without macular edema, bilateral: Secondary | ICD-10-CM | POA: Diagnosis not present

## 2022-06-20 DIAGNOSIS — E1021 Type 1 diabetes mellitus with diabetic nephropathy: Secondary | ICD-10-CM | POA: Diagnosis not present

## 2022-06-20 DIAGNOSIS — E10649 Type 1 diabetes mellitus with hypoglycemia without coma: Secondary | ICD-10-CM | POA: Diagnosis not present

## 2022-06-20 DIAGNOSIS — E673 Hypervitaminosis D: Secondary | ICD-10-CM | POA: Diagnosis not present

## 2022-06-20 DIAGNOSIS — E039 Hypothyroidism, unspecified: Secondary | ICD-10-CM | POA: Diagnosis not present

## 2022-06-20 DIAGNOSIS — M810 Age-related osteoporosis without current pathological fracture: Secondary | ICD-10-CM | POA: Diagnosis not present

## 2022-06-20 DIAGNOSIS — I129 Hypertensive chronic kidney disease with stage 1 through stage 4 chronic kidney disease, or unspecified chronic kidney disease: Secondary | ICD-10-CM | POA: Diagnosis not present

## 2022-06-20 DIAGNOSIS — E1022 Type 1 diabetes mellitus with diabetic chronic kidney disease: Secondary | ICD-10-CM | POA: Diagnosis not present

## 2022-06-20 DIAGNOSIS — N1832 Chronic kidney disease, stage 3b: Secondary | ICD-10-CM | POA: Diagnosis not present

## 2022-06-26 DIAGNOSIS — Z961 Presence of intraocular lens: Secondary | ICD-10-CM | POA: Diagnosis not present

## 2022-06-26 DIAGNOSIS — H401133 Primary open-angle glaucoma, bilateral, severe stage: Secondary | ICD-10-CM | POA: Diagnosis not present

## 2022-06-26 DIAGNOSIS — T8522XA Displacement of intraocular lens, initial encounter: Secondary | ICD-10-CM | POA: Diagnosis not present

## 2022-06-26 DIAGNOSIS — E113553 Type 2 diabetes mellitus with stable proliferative diabetic retinopathy, bilateral: Secondary | ICD-10-CM | POA: Diagnosis not present

## 2022-07-02 DIAGNOSIS — Z0000 Encounter for general adult medical examination without abnormal findings: Secondary | ICD-10-CM | POA: Diagnosis not present

## 2022-07-02 DIAGNOSIS — I493 Ventricular premature depolarization: Secondary | ICD-10-CM | POA: Diagnosis not present

## 2022-07-02 DIAGNOSIS — M8000XA Age-related osteoporosis with current pathological fracture, unspecified site, initial encounter for fracture: Secondary | ICD-10-CM | POA: Diagnosis not present

## 2022-07-02 DIAGNOSIS — I10 Essential (primary) hypertension: Secondary | ICD-10-CM | POA: Diagnosis not present

## 2022-07-02 DIAGNOSIS — E113591 Type 2 diabetes mellitus with proliferative diabetic retinopathy without macular edema, right eye: Secondary | ICD-10-CM | POA: Diagnosis not present

## 2022-07-02 DIAGNOSIS — Z79899 Other long term (current) drug therapy: Secondary | ICD-10-CM | POA: Diagnosis not present

## 2022-07-02 DIAGNOSIS — Z9181 History of falling: Secondary | ICD-10-CM | POA: Diagnosis not present

## 2022-07-02 DIAGNOSIS — Z9989 Dependence on other enabling machines and devices: Secondary | ICD-10-CM | POA: Diagnosis not present

## 2022-07-02 DIAGNOSIS — I129 Hypertensive chronic kidney disease with stage 1 through stage 4 chronic kidney disease, or unspecified chronic kidney disease: Secondary | ICD-10-CM | POA: Diagnosis not present

## 2022-07-02 DIAGNOSIS — D692 Other nonthrombocytopenic purpura: Secondary | ICD-10-CM | POA: Diagnosis not present

## 2022-07-02 DIAGNOSIS — E1122 Type 2 diabetes mellitus with diabetic chronic kidney disease: Secondary | ICD-10-CM | POA: Diagnosis not present

## 2022-07-02 DIAGNOSIS — N183 Chronic kidney disease, stage 3 unspecified: Secondary | ICD-10-CM | POA: Diagnosis not present

## 2022-07-17 DIAGNOSIS — T8522XA Displacement of intraocular lens, initial encounter: Secondary | ICD-10-CM | POA: Diagnosis not present

## 2022-07-17 DIAGNOSIS — H401133 Primary open-angle glaucoma, bilateral, severe stage: Secondary | ICD-10-CM | POA: Diagnosis not present

## 2022-07-17 DIAGNOSIS — E113553 Type 2 diabetes mellitus with stable proliferative diabetic retinopathy, bilateral: Secondary | ICD-10-CM | POA: Diagnosis not present

## 2022-07-17 DIAGNOSIS — Z961 Presence of intraocular lens: Secondary | ICD-10-CM | POA: Diagnosis not present

## 2022-08-04 DIAGNOSIS — M810 Age-related osteoporosis without current pathological fracture: Secondary | ICD-10-CM | POA: Diagnosis not present

## 2022-08-04 DIAGNOSIS — T8529XA Other mechanical complication of intraocular lens, initial encounter: Secondary | ICD-10-CM | POA: Diagnosis not present

## 2022-08-04 DIAGNOSIS — E1136 Type 2 diabetes mellitus with diabetic cataract: Secondary | ICD-10-CM | POA: Diagnosis not present

## 2022-08-04 DIAGNOSIS — I129 Hypertensive chronic kidney disease with stage 1 through stage 4 chronic kidney disease, or unspecified chronic kidney disease: Secondary | ICD-10-CM | POA: Diagnosis not present

## 2022-08-04 DIAGNOSIS — E1122 Type 2 diabetes mellitus with diabetic chronic kidney disease: Secondary | ICD-10-CM | POA: Diagnosis not present

## 2022-08-04 DIAGNOSIS — E113593 Type 2 diabetes mellitus with proliferative diabetic retinopathy without macular edema, bilateral: Secondary | ICD-10-CM | POA: Diagnosis not present

## 2022-08-04 DIAGNOSIS — N1832 Chronic kidney disease, stage 3b: Secondary | ICD-10-CM | POA: Diagnosis not present

## 2022-08-04 DIAGNOSIS — T8522XA Displacement of intraocular lens, initial encounter: Secondary | ICD-10-CM | POA: Diagnosis not present

## 2022-08-05 DIAGNOSIS — H401133 Primary open-angle glaucoma, bilateral, severe stage: Secondary | ICD-10-CM | POA: Diagnosis not present

## 2022-08-05 DIAGNOSIS — Z961 Presence of intraocular lens: Secondary | ICD-10-CM | POA: Diagnosis not present

## 2022-08-05 DIAGNOSIS — E113553 Type 2 diabetes mellitus with stable proliferative diabetic retinopathy, bilateral: Secondary | ICD-10-CM | POA: Diagnosis not present

## 2022-08-05 DIAGNOSIS — T8522XA Displacement of intraocular lens, initial encounter: Secondary | ICD-10-CM | POA: Diagnosis not present

## 2022-08-08 IMAGING — DX DG CHEST 2V
2 series · 2 of 2 positions shown · non-contrast
Comparison: 11/16/2010

CLINICAL DATA: Pulmonary hypertension.

EXAM:
CHEST - 2 VIEW

[chest pa]
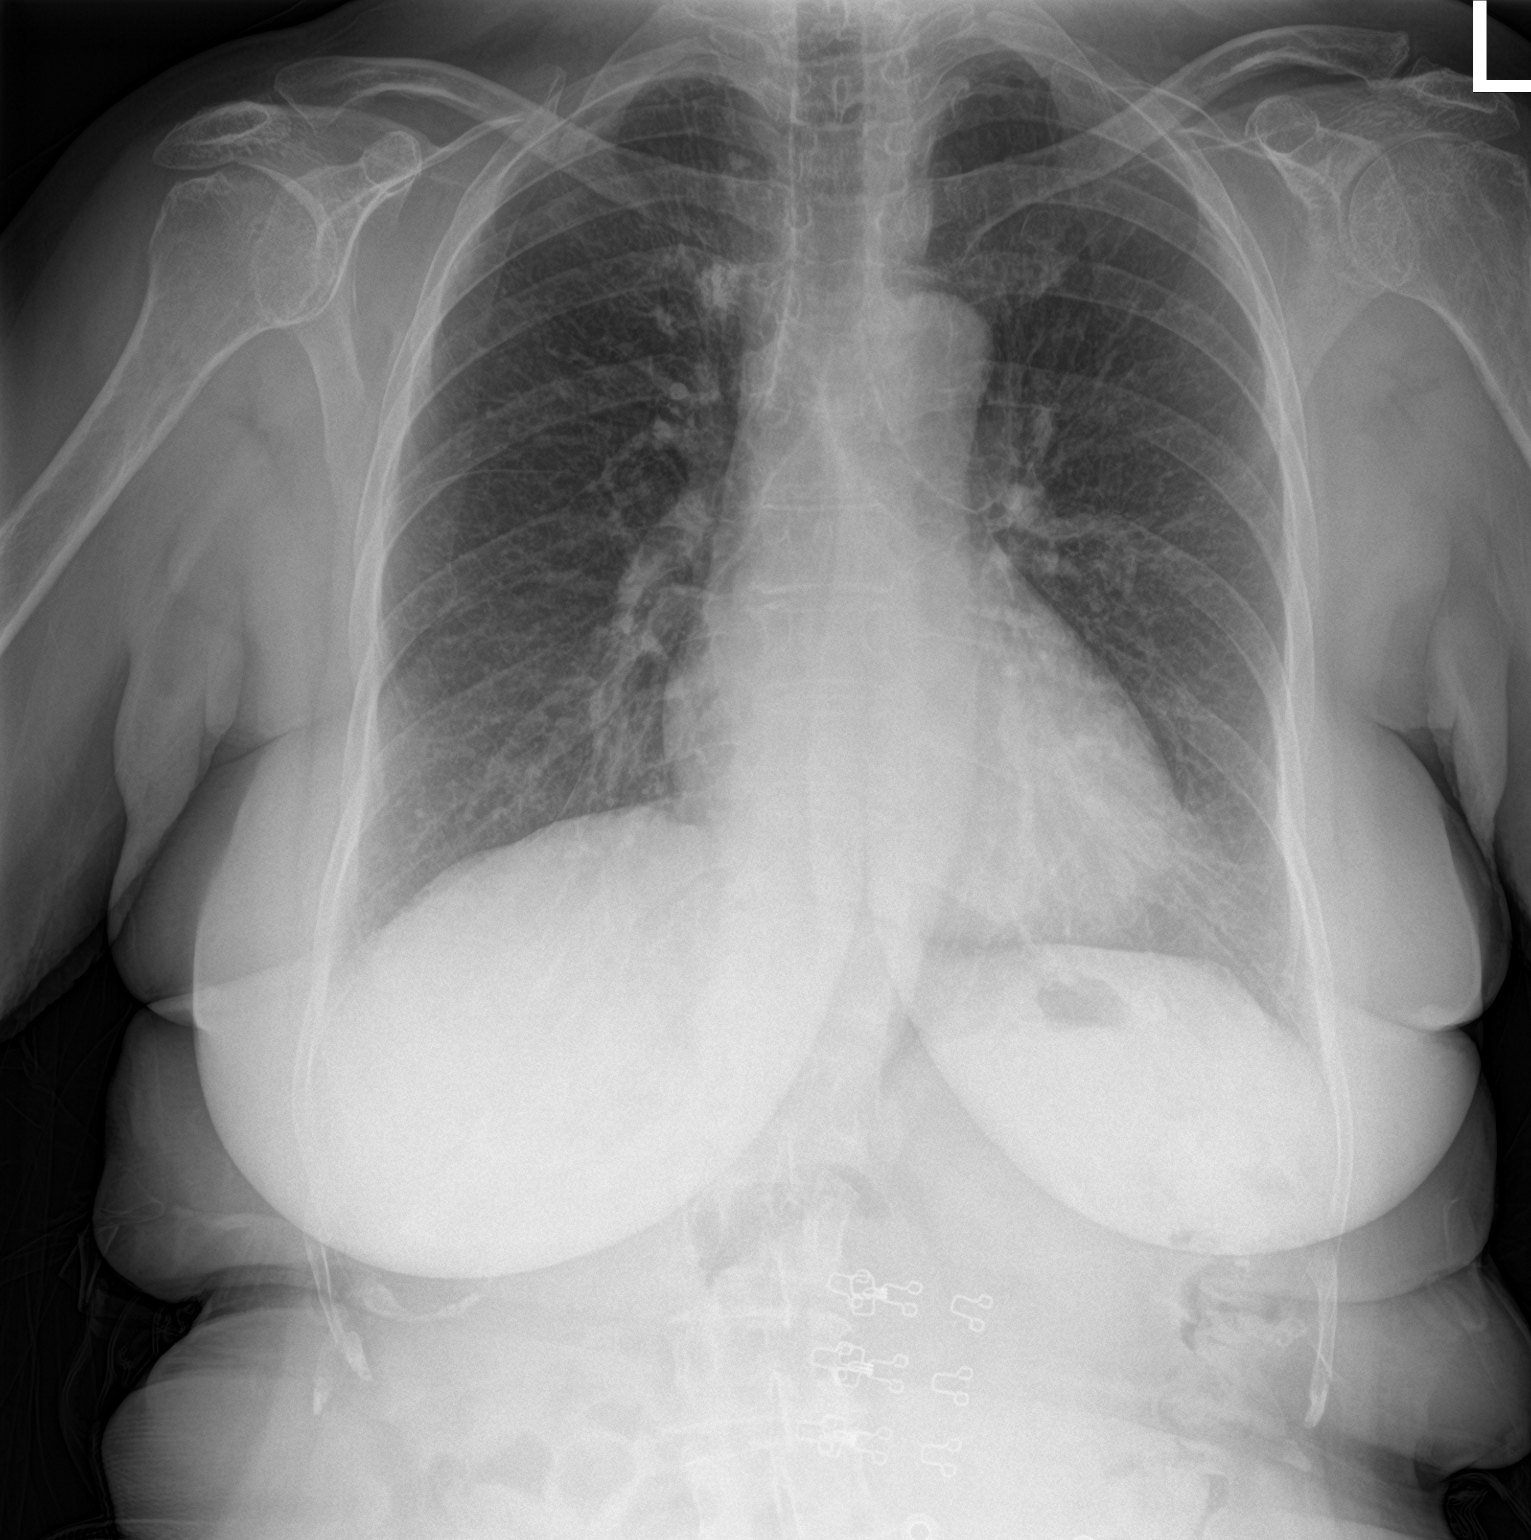

[chest lat]
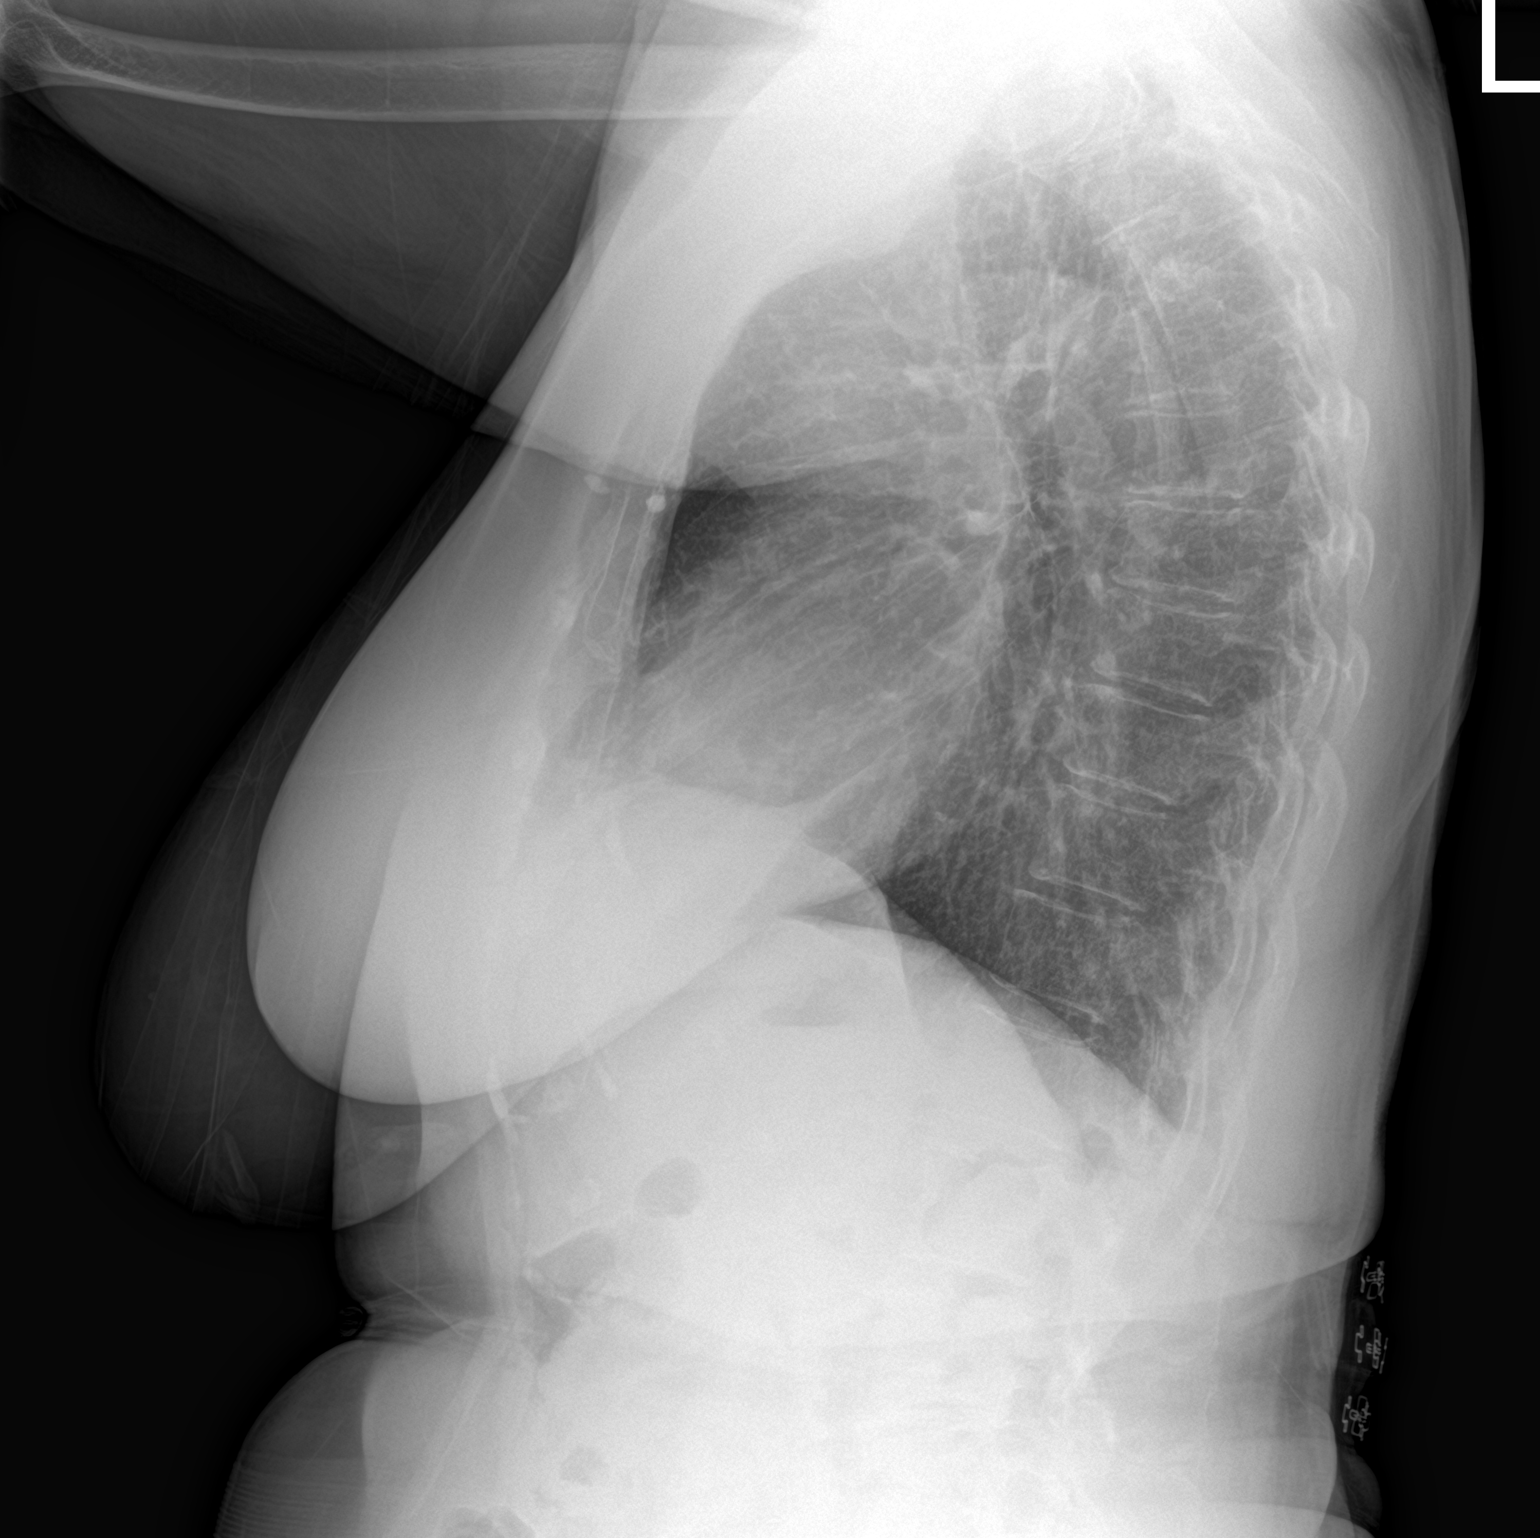

[2 of 2 positions shown; findings below may reference images not displayed]

FINDINGS: Heart is normal size. Mediastinal and hilar contours within normal
limits. No confluent opacities or effusions. No acute bony
abnormality.
IMPRESSION: No active cardiopulmonary disease.

## 2022-08-15 DIAGNOSIS — Z961 Presence of intraocular lens: Secondary | ICD-10-CM | POA: Diagnosis not present

## 2022-08-15 DIAGNOSIS — H401133 Primary open-angle glaucoma, bilateral, severe stage: Secondary | ICD-10-CM | POA: Diagnosis not present

## 2022-08-15 DIAGNOSIS — E113553 Type 2 diabetes mellitus with stable proliferative diabetic retinopathy, bilateral: Secondary | ICD-10-CM | POA: Diagnosis not present

## 2022-08-15 DIAGNOSIS — T8522XA Displacement of intraocular lens, initial encounter: Secondary | ICD-10-CM | POA: Diagnosis not present

## 2022-09-24 DIAGNOSIS — E1042 Type 1 diabetes mellitus with diabetic polyneuropathy: Secondary | ICD-10-CM | POA: Diagnosis not present

## 2022-09-24 DIAGNOSIS — N1832 Chronic kidney disease, stage 3b: Secondary | ICD-10-CM | POA: Diagnosis not present

## 2022-09-24 DIAGNOSIS — E1065 Type 1 diabetes mellitus with hyperglycemia: Secondary | ICD-10-CM | POA: Diagnosis not present

## 2022-09-24 DIAGNOSIS — E039 Hypothyroidism, unspecified: Secondary | ICD-10-CM | POA: Diagnosis not present

## 2022-09-24 DIAGNOSIS — E785 Hyperlipidemia, unspecified: Secondary | ICD-10-CM | POA: Diagnosis not present

## 2022-09-24 DIAGNOSIS — E1022 Type 1 diabetes mellitus with diabetic chronic kidney disease: Secondary | ICD-10-CM | POA: Diagnosis not present

## 2022-09-24 DIAGNOSIS — M810 Age-related osteoporosis without current pathological fracture: Secondary | ICD-10-CM | POA: Diagnosis not present

## 2022-09-24 DIAGNOSIS — E1069 Type 1 diabetes mellitus with other specified complication: Secondary | ICD-10-CM | POA: Diagnosis not present

## 2022-09-24 DIAGNOSIS — E10649 Type 1 diabetes mellitus with hypoglycemia without coma: Secondary | ICD-10-CM | POA: Diagnosis not present

## 2022-09-26 DIAGNOSIS — E785 Hyperlipidemia, unspecified: Secondary | ICD-10-CM | POA: Diagnosis not present

## 2022-09-26 DIAGNOSIS — E039 Hypothyroidism, unspecified: Secondary | ICD-10-CM | POA: Diagnosis not present

## 2022-09-26 DIAGNOSIS — E1022 Type 1 diabetes mellitus with diabetic chronic kidney disease: Secondary | ICD-10-CM | POA: Diagnosis not present

## 2022-09-26 DIAGNOSIS — E1069 Type 1 diabetes mellitus with other specified complication: Secondary | ICD-10-CM | POA: Diagnosis not present

## 2022-09-26 DIAGNOSIS — N1832 Chronic kidney disease, stage 3b: Secondary | ICD-10-CM | POA: Diagnosis not present

## 2022-09-26 DIAGNOSIS — E1065 Type 1 diabetes mellitus with hyperglycemia: Secondary | ICD-10-CM | POA: Diagnosis not present

## 2022-09-26 DIAGNOSIS — E1042 Type 1 diabetes mellitus with diabetic polyneuropathy: Secondary | ICD-10-CM | POA: Diagnosis not present

## 2022-09-26 DIAGNOSIS — M810 Age-related osteoporosis without current pathological fracture: Secondary | ICD-10-CM | POA: Diagnosis not present

## 2022-09-26 DIAGNOSIS — E103293 Type 1 diabetes mellitus with mild nonproliferative diabetic retinopathy without macular edema, bilateral: Secondary | ICD-10-CM | POA: Diagnosis not present

## 2022-09-26 DIAGNOSIS — E10649 Type 1 diabetes mellitus with hypoglycemia without coma: Secondary | ICD-10-CM | POA: Diagnosis not present

## 2022-09-26 DIAGNOSIS — I129 Hypertensive chronic kidney disease with stage 1 through stage 4 chronic kidney disease, or unspecified chronic kidney disease: Secondary | ICD-10-CM | POA: Diagnosis not present

## 2022-10-23 DIAGNOSIS — Z961 Presence of intraocular lens: Secondary | ICD-10-CM | POA: Diagnosis not present

## 2022-10-23 DIAGNOSIS — H401133 Primary open-angle glaucoma, bilateral, severe stage: Secondary | ICD-10-CM | POA: Diagnosis not present

## 2022-10-23 DIAGNOSIS — T8522XD Displacement of intraocular lens, subsequent encounter: Secondary | ICD-10-CM | POA: Diagnosis not present

## 2022-10-23 DIAGNOSIS — E113553 Type 2 diabetes mellitus with stable proliferative diabetic retinopathy, bilateral: Secondary | ICD-10-CM | POA: Diagnosis not present

## 2022-10-23 DIAGNOSIS — H44002 Unspecified purulent endophthalmitis, left eye: Secondary | ICD-10-CM | POA: Diagnosis not present

## 2022-10-23 DIAGNOSIS — T8522XA Displacement of intraocular lens, initial encounter: Secondary | ICD-10-CM | POA: Diagnosis not present

## 2022-10-23 DIAGNOSIS — H43811 Vitreous degeneration, right eye: Secondary | ICD-10-CM | POA: Diagnosis not present

## 2022-10-24 DIAGNOSIS — T8522XD Displacement of intraocular lens, subsequent encounter: Secondary | ICD-10-CM | POA: Diagnosis not present

## 2022-10-24 DIAGNOSIS — H44002 Unspecified purulent endophthalmitis, left eye: Secondary | ICD-10-CM | POA: Diagnosis not present

## 2022-10-30 DIAGNOSIS — T8522XD Displacement of intraocular lens, subsequent encounter: Secondary | ICD-10-CM | POA: Diagnosis not present

## 2022-10-30 DIAGNOSIS — Z961 Presence of intraocular lens: Secondary | ICD-10-CM | POA: Diagnosis not present

## 2022-10-30 DIAGNOSIS — E113553 Type 2 diabetes mellitus with stable proliferative diabetic retinopathy, bilateral: Secondary | ICD-10-CM | POA: Diagnosis not present

## 2022-10-30 DIAGNOSIS — H401133 Primary open-angle glaucoma, bilateral, severe stage: Secondary | ICD-10-CM | POA: Diagnosis not present

## 2022-11-06 DIAGNOSIS — Z961 Presence of intraocular lens: Secondary | ICD-10-CM | POA: Diagnosis not present

## 2022-11-06 DIAGNOSIS — E113553 Type 2 diabetes mellitus with stable proliferative diabetic retinopathy, bilateral: Secondary | ICD-10-CM | POA: Diagnosis not present

## 2022-11-06 DIAGNOSIS — H401133 Primary open-angle glaucoma, bilateral, severe stage: Secondary | ICD-10-CM | POA: Diagnosis not present

## 2022-11-20 DIAGNOSIS — H401133 Primary open-angle glaucoma, bilateral, severe stage: Secondary | ICD-10-CM | POA: Diagnosis not present

## 2022-11-20 DIAGNOSIS — H35353 Cystoid macular degeneration, bilateral: Secondary | ICD-10-CM | POA: Diagnosis not present

## 2022-11-20 DIAGNOSIS — E113553 Type 2 diabetes mellitus with stable proliferative diabetic retinopathy, bilateral: Secondary | ICD-10-CM | POA: Diagnosis not present

## 2022-11-20 DIAGNOSIS — Z961 Presence of intraocular lens: Secondary | ICD-10-CM | POA: Diagnosis not present

## 2022-12-30 DIAGNOSIS — H401133 Primary open-angle glaucoma, bilateral, severe stage: Secondary | ICD-10-CM | POA: Diagnosis not present

## 2022-12-30 DIAGNOSIS — E113553 Type 2 diabetes mellitus with stable proliferative diabetic retinopathy, bilateral: Secondary | ICD-10-CM | POA: Diagnosis not present

## 2023-02-05 DIAGNOSIS — H401133 Primary open-angle glaucoma, bilateral, severe stage: Secondary | ICD-10-CM | POA: Diagnosis not present

## 2023-02-05 DIAGNOSIS — H04123 Dry eye syndrome of bilateral lacrimal glands: Secondary | ICD-10-CM | POA: Diagnosis not present

## 2023-02-05 DIAGNOSIS — E113553 Type 2 diabetes mellitus with stable proliferative diabetic retinopathy, bilateral: Secondary | ICD-10-CM | POA: Diagnosis not present

## 2023-02-05 DIAGNOSIS — T8522XD Displacement of intraocular lens, subsequent encounter: Secondary | ICD-10-CM | POA: Diagnosis not present

## 2023-06-05 DIAGNOSIS — E039 Hypothyroidism, unspecified: Secondary | ICD-10-CM | POA: Diagnosis not present

## 2023-06-05 DIAGNOSIS — E1042 Type 1 diabetes mellitus with diabetic polyneuropathy: Secondary | ICD-10-CM | POA: Diagnosis not present

## 2023-06-05 DIAGNOSIS — E1022 Type 1 diabetes mellitus with diabetic chronic kidney disease: Secondary | ICD-10-CM | POA: Diagnosis not present

## 2023-06-05 DIAGNOSIS — E10649 Type 1 diabetes mellitus with hypoglycemia without coma: Secondary | ICD-10-CM | POA: Diagnosis not present

## 2023-06-05 DIAGNOSIS — E1065 Type 1 diabetes mellitus with hyperglycemia: Secondary | ICD-10-CM | POA: Diagnosis not present

## 2023-06-05 DIAGNOSIS — E1069 Type 1 diabetes mellitus with other specified complication: Secondary | ICD-10-CM | POA: Diagnosis not present

## 2023-06-05 DIAGNOSIS — E669 Obesity, unspecified: Secondary | ICD-10-CM | POA: Diagnosis not present

## 2023-06-05 DIAGNOSIS — E785 Hyperlipidemia, unspecified: Secondary | ICD-10-CM | POA: Diagnosis not present

## 2023-06-05 DIAGNOSIS — E103293 Type 1 diabetes mellitus with mild nonproliferative diabetic retinopathy without macular edema, bilateral: Secondary | ICD-10-CM | POA: Diagnosis not present

## 2023-06-05 DIAGNOSIS — N1832 Chronic kidney disease, stage 3b: Secondary | ICD-10-CM | POA: Diagnosis not present

## 2023-06-05 DIAGNOSIS — I129 Hypertensive chronic kidney disease with stage 1 through stage 4 chronic kidney disease, or unspecified chronic kidney disease: Secondary | ICD-10-CM | POA: Diagnosis not present
